# Patient Record
Sex: Female | Born: 1965
Health system: Southern US, Community
[De-identification: ages and names within clinical notes are randomized; demographics above are authoritative.]

## PROBLEM LIST (undated history)

## (undated) DIAGNOSIS — E039 Hypothyroidism, unspecified: Secondary | ICD-10-CM

## (undated) DIAGNOSIS — E785 Hyperlipidemia, unspecified: Secondary | ICD-10-CM

---

## 1997-09-13 ENCOUNTER — Other Ambulatory Visit: Admission: RE | Admit: 1997-09-13 | Discharge: 1997-09-13 | Payer: Self-pay | Admitting: *Deleted

## 1998-10-16 ENCOUNTER — Other Ambulatory Visit: Admission: RE | Admit: 1998-10-16 | Discharge: 1998-10-16 | Payer: Self-pay | Admitting: *Deleted

## 2000-07-13 ENCOUNTER — Other Ambulatory Visit: Admission: RE | Admit: 2000-07-13 | Discharge: 2000-07-13 | Payer: Self-pay | Admitting: *Deleted

## 2001-08-01 ENCOUNTER — Other Ambulatory Visit: Admission: RE | Admit: 2001-08-01 | Discharge: 2001-08-01 | Payer: Self-pay | Admitting: *Deleted

## 2002-01-22 ENCOUNTER — Other Ambulatory Visit: Admission: RE | Admit: 2002-01-22 | Discharge: 2002-01-22 | Payer: Self-pay | Admitting: *Deleted

## 2003-04-15 ENCOUNTER — Other Ambulatory Visit: Admission: RE | Admit: 2003-04-15 | Discharge: 2003-04-15 | Payer: Self-pay | Admitting: *Deleted

## 2004-07-01 ENCOUNTER — Encounter: Admission: RE | Admit: 2004-07-01 | Discharge: 2004-07-01 | Payer: Self-pay | Admitting: Obstetrics and Gynecology

## 2004-09-02 ENCOUNTER — Other Ambulatory Visit: Admission: RE | Admit: 2004-09-02 | Discharge: 2004-09-02 | Payer: Self-pay | Admitting: Obstetrics and Gynecology

## 2004-09-11 ENCOUNTER — Encounter: Admission: RE | Admit: 2004-09-11 | Discharge: 2004-09-11 | Payer: Self-pay | Admitting: Obstetrics and Gynecology

## 2009-08-09 ENCOUNTER — Ambulatory Visit: Payer: Self-pay | Admitting: Diagnostic Radiology

## 2009-08-09 ENCOUNTER — Emergency Department (HOSPITAL_BASED_OUTPATIENT_CLINIC_OR_DEPARTMENT_OTHER): Admission: EM | Admit: 2009-08-09 | Discharge: 2009-08-09 | Payer: Self-pay | Admitting: Emergency Medicine

## 2010-01-28 ENCOUNTER — Ambulatory Visit: Payer: Self-pay | Admitting: Sports Medicine

## 2010-01-28 DIAGNOSIS — M722 Plantar fascial fibromatosis: Secondary | ICD-10-CM | POA: Insufficient documentation

## 2010-02-26 ENCOUNTER — Ambulatory Visit: Payer: Self-pay | Admitting: Sports Medicine

## 2010-04-09 ENCOUNTER — Ambulatory Visit
Admission: RE | Admit: 2010-04-09 | Discharge: 2010-04-09 | Payer: Self-pay | Source: Home / Self Care | Attending: Sports Medicine | Admitting: Sports Medicine

## 2010-04-09 DIAGNOSIS — M79609 Pain in unspecified limb: Secondary | ICD-10-CM | POA: Insufficient documentation

## 2010-04-23 NOTE — Assessment & Plan Note (Signed)
Summary: LEFT HEEL PAIN X 1.5 MOS /NP/LP   Vital Signs:  Patient profile:   45 year old female Height:      66 inches Weight:      159 pounds BMI:     25.76 BP sitting:   120 / 64  Vitals Entered By: Lillia Pauls CMA (January 28, 2010 3:43 PM)   History of Present Illness: Normally runs 3x week 2nd year of runing program usually runs 3 miles  heel started hurting 45 days ago hard to walk at first  soaked and rolled on ice  still ran race - womens only  now pain first step hard to walk without landing on outside of left foot  Preventive Screening-Counseling & Management  Alcohol-Tobacco     Smoking Status: never  Allergies (verified): No Known Drug Allergies  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  Physical Exam  General:  Well-developed,well-nourished,in no acute distress; alert,appropriate and cooperative throughout examination Msk:  mod high arches TTP at insertion of PF into calcaneus medially on left good ROM great toe no TTP on left  weak left hip abductor  leg lengths equal Additional Exam:  MSK [place] LT PF is 0.57 RT PF is 0.39 spur on left noted no sign of tear   Impression & Recommendations:  Problem # 1:  PLANTAR FASCIITIS (ICD-728.71)  Orders: Foot Orthosis ( Arch Strap/Heel Cup) [phone])  given std PF stretches and exercises use arch strap use heel cups ice daily  reck 1 mo   Orders Added: 1)  Foot Orthosis ( Arch Strap/Heel Cup) [U0454] 2)  New Patient Level II [09811]

## 2010-04-23 NOTE — Assessment & Plan Note (Signed)
Summary: f/u,mc   Vital Signs:  Patient profile:   45 year old female BP sitting:   111 / 74  Vitals Entered By: Lillia Pauls CMA (February 26, 2010 9:42 AM)  History of Present Illness: Brenda Frost is here for one month follow up of L plantar fasciitis. She is greater than 50% improved, and feels she can walk without a limp. She only has pain after activity; no routine morning pain. She has been perfoming prescribed exercises and stretches and uses what sounds like heel lifts in her shoes. She has not been able to tolerate icing. She is now exercising twice a week 30-45 min on the eliptical or bike.  Allergies: No Known Drug Allergies  Physical Exam  General:  alert and well-nourished.   Msk:  Cavus feet. Normal ROM of foot, ankle, and great toe bilaterally. No swelling; minimal tenderness to deep palpation of L medial calcaneous. Pulses:  Posterior tibial pulses palpable and equal. Neurologic:  Barefoot gait cautious but with minimal limp.   Impression & Recommendations:  Problem # 1:  PLANTAR FASCIITIS (ICD-728.71) Assessment Improved Improved. Continue to stretch and perform exercises as instructed, and continue to ice as able.May begin slowly encorporating treadmill to workout if she is able to run without limp. Follow up in 6 weeks to rescan the tendon.   Orders Added: 1)  Est. Patient Level III [16109]

## 2010-04-23 NOTE — Assessment & Plan Note (Signed)
Summary: F/U LF LEG FASCIITIS/[facility]   Vital Signs:  Patient profile:   45 year old female Height:      66 inches Weight:      159 pounds Pulse rate:   72 / minute BP sitting:   105 / 72  (right arm)  Vitals Entered By: Rochele Pages RN (April 09, 2010 9:24 AM) CC: f/u L PF worse than last visit   CC:  f/u L PF worse than last visit.  History of Present Illness: Pt presents to clinic for follow up of L plantar fasciitis which she reports is worse than at last visit. Has had pain each morning, and pain is now in the dorsal and lateral aspect of L foot as well.  She continues to be compliant with PF exercises, wearing heel cup and arch strap.  Feels that her foot feels better at the end of the day when she removes the arch strap.  She uses the elliptical for exercise- feels some pressure on heel with this at the end of the workout.  Tried to run for 2 minutes, but this was painful.  Occasional numbness/tingling on anterior-lateral.  Some swelling along ankle at end of the day on both feet. Denies any numbness/tingling.  No previous steroid injections, but would like to avoid these if possible.  Has never had custom orthotics.  Preventive Screening-Counseling & Management  Alcohol-Tobacco     Smoking Status: never  Allergies: No Known Drug Allergies  Review of Systems       per HPI, othwerise ROS negative.  Physical Exam  General:  Well-developed,well-nourished,in no acute distress; alert,appropriate and cooperative throughout examination Msk:  ANKLES: FROM b/l without pain.  No swelling or effusion.  Mildly TTP along peroneal tendons on Lt, no swelling in this area, no tenderness over insertion of peroneal tendons.  Normal ankle strength b/l.  No instability.  FEET:  cavus foot b/l.  No significant callouse formation.  No hallux rigidis.  TTP along insertion of PF on Lt, no tenderness on Rt.  No transverse arch collapse.  GAIT: no leg length difference.  Walks with cautious gait,  trying to limit impact on left foot.  Lt foot slightly supinated.  Minimal limp noted. Pulses:  +2/4 DP & PT b/l Neurologic:  sensation intact to light touch.   Additional Exam:  MSK U/S: Plantar fascia- L PF measuring 0.47 today, slightly decreased from 0.57 on 01/28/10.  Hypoechoic area near insertion into calcaneus, but no true splitting or tear visible.  No increased doppler flow in this area.  Still with visible calcaneal spur.  Rt PF measuring 0.39.  Images saved.   Impression & Recommendations:  Problem # 1:  FOOT PAIN, LEFT (ICD-729.5) Assessment Deteriorated  - Worsening Lt foot pain secondary to plantar fasciitis - Discussed tx options, not interested in steroid injection, would like to try custom orthotics - was fitted for these in office today. Patient was fitted for a : standard, cushioned, semi-rigid orthotic. The orthotic was heated and afterward the patient stood on the orthotic blank positioned on the orthotic stand. The patient was positioned in subtalar neutral position and 10 degrees of ankle dorsiflexion in a weight bearing stance. After completion of molding, a stable base was applied to the orthotic blank. The blank was ground to a stable position for weight bearing. Size: 7 Base: blue swirl Posting: blue EVA Additional orthotic padding: none Orthotics comfortable to pt in office today and noted to have neutral gait with orthotics without  a limp. 45-mins spent with patient for evaluation & preparation of orthotics.  Orders: [place] LIMITED ([id number]) Orthotic Materials, each unit (U0454)  Problem # 2:  PLANTAR FASCIITIS (ICD-728.71) Assessment: Deteriorated  - worsening Lt PF - Fitted with custom orthotics in office today as noted above - May cont. to use heel cups with orthotics as needed - Cont. to wear arch strap - Cont. PF exercises/stretches - Cont to cross train with ellpitical, may attempt running in the future if not limping. - f/u 4-6 weeks for  re-evaluation, encouraged to call with any questions or concerns.  Orders: [place] LIMITED ([id number]) Orthotic Materials, each unit [phone])   Orders Added: 1)  Est. Patient Level IV [29562] 2)  [place] LIMITED [[id number]] 3)  Orthotic Materials, each unit [L3002]

## 2010-05-14 ENCOUNTER — Ambulatory Visit (INDEPENDENT_AMBULATORY_CARE_PROVIDER_SITE_OTHER): Payer: Commercial Managed Care - PPO | Admitting: Sports Medicine

## 2010-05-14 ENCOUNTER — Encounter: Payer: Self-pay | Admitting: Sports Medicine

## 2010-05-14 DIAGNOSIS — M79609 Pain in unspecified limb: Secondary | ICD-10-CM

## 2010-05-14 DIAGNOSIS — M722 Plantar fascial fibromatosis: Secondary | ICD-10-CM

## 2010-05-19 NOTE — Assessment & Plan Note (Signed)
Summary: 9:00 APPT - FU/MC/MJD   CC:  f/u L plantar fasciitis.  History of Present Illness: 45yo female to office for f/u L plantar fasaciitis.  No change in pain since last office visit when fitted with custom orthotics.  Still having pain each morning that is present throughout the day.  Pain is causing her to limp & walk on outside of her left foot - now starting to get increasing pain & some swelling along L 4th MT.  Continues to wear arch straps & do home exercises.  Has continued using the elliptical.  Denies numbness/tingling.  Is not taking anything for the pain.  No hx of steroid injections & would like to avoid these.  Allergies: No Known Drug Allergies  Review of Systems       per HPI  Physical Exam  General:  Well-developed,well-nourished,in no acute distress; alert,appropriate and cooperative throughout examination Msk:  ANKLES: FROM b/l without pain.  No swelling or effusion.  Mildly TTP along peroneal tendons on Lt, no swelling in this area, no tenderness over insertion of peroneal tendons.  Normal ankle strength b/l.  No instability.  FEET:  cavus foot b/l.  No significant callouse formation.  Extremely TTP along insertion of PF at medial calcaneous on Lt, no tenderness on Rt.  Small tender nodule with surrounding soft tissue swelling noted over proximal 4th MT.  Large amount of pain with MT squeeze on the left foot.    GAIT: no leg length difference.  Walks with prominent limp favoring left foot.  Walking with left foot supinated to limit impact on the heel.  Pulses:  +2/4 DP & PT Neurologic:  sensation intact to light touch.   Additional Exam:  MSK U/S: Plantar fascia- L PF measuring 0.51 today, no splitting noted, calcaneal spurring again noted.  Rt PF measuring 0.30cm.   Exam of MTs revealed normal appearing 5th MT & 5th TMT/MTP joints.  Proximal 4th MT with surrounding fluid & small, possible cortical irregularlity in long view, does appear to have cap sign over 4th MT  on transview - findings consistant with MT stress rxn vs stress fx.  Minimal increase in doppler flow noted in area of proximal 4th MT.  Normal appearing 3rd MT.  Images saved.   Impression & Recommendations:  Problem # 1:  FOOT PAIN, LEFT (ICD-729.5) - PF that is unchanged, now with proximal 4th MT stress reaction vs stress fracture - Fitted with post-op shoe in office today.  She was able to ambulate more comfortably with this.  May wear orthotic with this for addtional foot support - Cont. to wear arch straps - Recommend she start taking Calcium 1200mg  & Vit D 800 International Units daily - May use exercise bike if not having pain, may slowly progress back to elliptical if able - f/u 2-3 weeks for re-evaluation  Orders: Post-op Shoe (L3260) [place] LIMITED ([id number])  Problem # 2:  PLANTAR FASCIITIS (ICD-728.71) Assessment: Unchanged  - No significant improvement with custom orthotics, now complicated by left proximal 4th MT stress rxn vs stress fx - Post-op shoe as stated above with orthotic - may help with her PF - Cont. PF exercises/stretches - Cont. arch strap - Again discussed possibility of injection, but pt not interested - f/u 2-3 weeks  Orders: Post-op Shoe (L3260) [place] LIMITED ([id number])   Orders Added: 1)  Post-op Shoe [L3260] 2)  Est. Patient Level IV [69629] 3)  [place] LIMITED [[id number]]

## 2010-06-04 ENCOUNTER — Encounter: Payer: Self-pay | Admitting: Sports Medicine

## 2010-06-04 ENCOUNTER — Ambulatory Visit (INDEPENDENT_AMBULATORY_CARE_PROVIDER_SITE_OTHER): Payer: Commercial Managed Care - PPO | Admitting: Sports Medicine

## 2010-06-04 DIAGNOSIS — M722 Plantar fascial fibromatosis: Secondary | ICD-10-CM

## 2010-06-04 DIAGNOSIS — M84376A Stress fracture, unspecified foot, initial encounter for fracture: Secondary | ICD-10-CM | POA: Insufficient documentation

## 2010-06-08 LAB — URINE MICROSCOPIC-ADD ON

## 2010-06-08 LAB — URINE CULTURE: Colony Count: NO GROWTH

## 2010-06-08 LAB — URINALYSIS, ROUTINE W REFLEX MICROSCOPIC
Bilirubin Urine: NEGATIVE
Glucose, UA: NEGATIVE mg/dL
Hgb urine dipstick: NEGATIVE
Ketones, ur: NEGATIVE mg/dL
Nitrite: NEGATIVE
Protein, ur: NEGATIVE mg/dL
Specific Gravity, Urine: 1.011 (ref 1.005–1.030)
Urobilinogen, UA: 0.2 mg/dL (ref 0.0–1.0)
pH: 6 (ref 5.0–8.0)

## 2010-06-09 NOTE — Assessment & Plan Note (Signed)
Summary: F/U,MC   Vital Signs:  Patient profile:   45 year old female Pulse rate:   90 / minute BP sitting:   94 / 63  (right arm)  Vitals Entered By: Rochele Pages RN (June 04, 2010 9:51 AM) CC: f/u left foot   CC:  f/u left foot.  History of Present Illness: 45yo female to office for f/u of left foot 4th MT stress fx & plantar fasciitis.  Feels 20% improved overall.  Occasional burning sensation along dorsal-lateral foot.  Still wearing post-op shoe daily.  Arch straps are comfortable.  Able to use stationary bike & elliptical in regular shoe without much pain.  No longer having much swelling in the foot.  Using ibuprofen as needed.  Preventive Screening-Counseling & Management  Alcohol-Tobacco     Smoking Status: never  Allergies: No Known Drug Allergies  Physical Exam  General:  Well-developed,well-nourished,in no acute distress; alert,appropriate and cooperative throughout examination Msk:  ANNKLES: FROM b/l without pain.  No swelling.  Mildly TTP along peroneal tendons on Lt.  Normal strength.  FEET:  L foot- no significant soft tissue swelling, tender nodule noted last visit has resolved.  TTP along 4th MT, only minimal pain with MT squeeze.   Only mildly TTP along insertion of PF today.  GAIT: still walking with antalgic gait favoring left leg., although improved from last visit  NVI distally Additional Exam:  MSK U/S: L foot- 4th MT with good callus formation & (+)cap sign on transverse view, mild amount of surrounding hypoechoic fluid.  No signifiant neovessels noted.  Normal appearing 3rd & 5th MTs.  Images saved.   Impression & Recommendations:  Problem # 1:  STRESS FRACTURE OF THE METATARSALS (QIO-962.95) Assessment Improved  - Left 4th MT stress fx secondary to altered gait from extremely symptomatic plantar fasciitis - Good callus formation & healing noted on MSK u/s today - May start to wean post-op shoe - try wearing every other day for next 1-2 weeks. -  Wear supportive shoes with orthotics when out of post-op shoe - Cont arch straps - Cont elliptical & stationary bike, ok to start walking if not having much pain - f/u 3-weeks  Orders: [place] LIMITED ([id number])  Problem # 2:  PLANTAR FASCIITIS (ICD-728.71) Assessment: Improved  - Overall improved - Cont. arch straps & orthotics as stated above - Cont exercises - f/u 3-weeks  Orders: [place] LIMITED ([id number])  Patient Instructions: 1)  You have signs of healing on the ultrasound today. 2)  Start to wean from you cast shoe - wear every other day for the next 1-2 weeks.  Try to focus on walk upright witout a limp if possible. 3)  Cont. to wear arch straps. 4)  Cont. ibuprofen as needed. 5)  f/u 3-weeks. 6)  Call with any questions/concerns.   Orders Added: 1)  Est. Patient Level III [02725] 2)  [place] LIMITED [[id number]]

## 2010-07-07 ENCOUNTER — Ambulatory Visit: Payer: 59 | Admitting: Family Medicine

## 2010-07-07 ENCOUNTER — Encounter: Payer: Self-pay | Admitting: Family Medicine

## 2010-07-07 NOTE — Progress Notes (Signed)
Medical Nutrition Therapy:  Appt start time: 1430 end time:  1530.  Assessment:  Primary concerns today: Weight management and hyperlipidemia.  Aashika has had hyperlipidemia for years.  She was unsure of her recent LDL level, but lab results have been requested from Dr. Julio Sicks' office.  Deloma is frustrated that her weight loss efforts since Nov 2011 have resulted in no weight loss.  Usual eating pattern includes 3 meals and 2-3 snacks per day. Usual bkfst is 2-3 egg whites & 6 oz [place] yogurt, [employer] coffee (at lst 3 X wk) w/ 4 creamers & 4 Splenda.  Avoided foods include fried foods, pizza (usually), and most breads.  24-hr recall: B (8 AM)- 6 oz Greek yogurt; Snk (10 AM)- 8 amonds; L (12 PM)- salad w/ sunflwr seeds & ~3 tbsp dressing, 1 c chx soup, water; Snk- none; D (7:30 PM)- 1 slc cheese pizza, sliver b'day cake, water; Snk- none.  Atypical yesterday in that bkfst was smaller than usual, and dinner is usually lean pro  and veg's.  Usual physical activity includes 45-60 min (or 500 kcal) eliptical or spin class 3-4 X wk.  Yaslin was doing wt training, but gained wt with that.  Since last November, Alisa has been drinking more water, eating bkfst, more protein, and eating dinner earlier.  She uses [employer] for tracking kcal.    Progress Towards Goal(s):  In progress.   Nutritional Diagnosis:  NI-1.4 Inadequate energy intake As related to metabolic needs.  As evidenced by no weight loss despite low energy intake and consistent exercise 3-4 X wk.  I wonder if Rebeka has altered her RMR through dietary restriction.  Inadequate vegetables and fruits.      Intervention:  Nutrition education.  Monitoring/Evaluation:  Dietary intake, exercise, and body weight in 3 weeks.

## 2010-07-07 NOTE — Patient Instructions (Addendum)
-   Never go more than 5 hours without eating.  - Let hunger be your guide:  If you are hungry, you should eat!   - Ask if there is a non- or low-fat creamer at [employer].   - Explore [employer] app (look for [employer] creamer).  Reduce the amount of creamer and Splenda you use progressively over the next several weeks.   - Increase vegetables, aiming for at least 2 X day.  (Plan ahead; streamline process...) - Obtain twice as many veg's as protein for lunch and dinner (and include starch as appropriate).   - When choosing starch foods, go for high fiber.   - Email at least 5 days worth of food records, including time of eating, what, and how much you eat.  (Provide as many details as possible, calories, carb's, fat, and protein).   - Physical activity:  Aim for at least 5 X wk; include walking to complement your other ex.   - Add fruit to breakfast.  Continue black beans on Saturday breakfasts.

## 2010-07-27 ENCOUNTER — Ambulatory Visit (INDEPENDENT_AMBULATORY_CARE_PROVIDER_SITE_OTHER): Payer: 59 | Admitting: Family Medicine

## 2010-07-27 ENCOUNTER — Ambulatory Visit: Payer: 59 | Admitting: Family Medicine

## 2010-07-27 VITALS — Ht 66.0 in | Wt 160.5 lb

## 2010-07-27 DIAGNOSIS — E785 Hyperlipidemia, unspecified: Secondary | ICD-10-CM | POA: Insufficient documentation

## 2010-07-27 NOTE — Patient Instructions (Addendum)
Dealing with emotional eating: - Delay:  Neither urges nor emotions last forever - Distract:  Engaging in another activity can truly get your mind off of food  - Distance:  Move to another room (away from the food), go for a walk, or change your environment - Determine:  What is happening here, and what are my options? - Decide:  What will be your action plan? - Use the eating decisions algorithm (see handout) when needed.  Also think about other responses to stress than food, i.e., exercise, sleep.   - Advance planning is going to be crucial to your success in making good food and exercise choices.   - Discipine to USE THESE TOOLS.  - Continue to decrease cream and sugar in coffee as tolerated.   - Physical activity goal this week:  At least 3 X.

## 2010-07-27 NOTE — Progress Notes (Signed)
Medical Nutrition Therapy:  Appt start time: 1430 end time:  1530.  Assessment:  Primary concerns today: Weight management and hyperlipidemia.   Kirsta has been very stressed at work and has worked close to 60 hours/wk in the past couple of weeks.  This has affected ability to plan or prepare meals and to buy foods, as well as to exercise as much as she'd like.  She did manage to do 40 min on the eliptical last week 2 X last week, and she did one Zumba class.  Saturday's food intake:   8:30 AM - 1 cup Kashi Oat cereal with skim milk- very hunger 10:30 AM - 1 [employer] coffee Med, 3c, 3s Lunch (1PM) 1 [place] sandwich with 1 slice of provolone cheese, [place] on thin whole wheat bread 10 Cheddar Combos 1 orange 1 water Dinner (7 PM) 1 slice of [place] 6 oz low-cal Margarita with lots of lime  Snk (9 PM)  1 c basmati rice  c black beans  Progress Towards Goal(s):  In progress.   Nutritional Diagnosis:  NI-1.4 Inadequate energy intake As related to metabolic needs.  As evidenced by no weight loss despite low energy intake and consistent exercise 3-4 X wk.  I wonder if Chalee has altered her RMR through dietary restriction.  Inadequate vegetables and fruits.      Intervention:  Nutrition education.  Monitoring/Evaluation:  Dietary intake, exercise, and body weight in 4 weeks.

## 2010-08-03 ENCOUNTER — Encounter: Payer: Self-pay | Admitting: Family Medicine

## 2010-08-24 ENCOUNTER — Ambulatory Visit (INDEPENDENT_AMBULATORY_CARE_PROVIDER_SITE_OTHER): Payer: 59 | Admitting: Family Medicine

## 2010-08-24 DIAGNOSIS — E785 Hyperlipidemia, unspecified: Secondary | ICD-10-CM

## 2010-08-24 NOTE — Progress Notes (Signed)
Medical Nutrition Therapy:  Appt start time: 1230 end time:  1300.  Assessment:  Primary concerns today: Weight management and hyperlipidemia.   Jalee has been successful in getting water intake through the day (total of at least 8 16-oz bottles).  She is drinking coffee only once a week.  She's been having a protein shake for breakfast, and she's considering using this for an afternoon snack.  Norissa has been exercising at least 3 X wk.  (Goal this week is every day.)  24-hr recall suggests intake of ~1200 kcal: B (9 AM)- water, 2 scrambled eggs, 1c strawberries; Snk- water; L (1 PM)- 2 tbsp tuna w/ tomato, cilantro, & lime, 1/2 c rice, 1/2 potato w/ veg mixture, water; Snk (4 PM)- 1/2 banana w/ 1 tbsp pb, 3 tbsp quinoa & blackeyed peas, water; D (7 PM)- 2 tbsp tuna, 2 bread thins, 10-15 cheese nips, water; Snk (7:30 PM)- 1 apple, water.   Progress Towards Goal(s):  In progress.   Nutritional Diagnosis:  NI-1.4 Inadequate energy intake As related to metabolic needs.  As evidenced by no weight loss despite low energy intake and consistent exercise 3-4 X wk.  Inadequate vegetables and fruits.      Intervention:  Nutrition education.  Monitoring/Evaluation:  Dietary intake, exercise, and body weight in 4 weeks.

## 2010-08-24 NOTE — Patient Instructions (Signed)
-   Increase vegetables; think ahead about what veg's you'll include at both lunch and dinner. - Project:  Grab & go veg's and fruits.   - Make a list of 7 meals that you like, that meet your nutritional needs, and are easy to prepare.  Email that to [facility] for review.   - Call or email in a week for a July appt:  [phone].  Let me know 2 or 3 preferred days and times.

## 2013-08-07 ENCOUNTER — Encounter (HOSPITAL_COMMUNITY): Payer: Self-pay | Admitting: Emergency Medicine

## 2013-08-07 ENCOUNTER — Emergency Department (HOSPITAL_COMMUNITY)
Admission: EM | Admit: 2013-08-07 | Discharge: 2013-08-07 | Disposition: A | Discharge status: Home / Self Care | Payer: 59 | Attending: Emergency Medicine | Admitting: Emergency Medicine

## 2013-08-07 DIAGNOSIS — Z9889 Other specified postprocedural states: Secondary | ICD-10-CM | POA: Insufficient documentation

## 2013-08-07 DIAGNOSIS — M549 Dorsalgia, unspecified: Secondary | ICD-10-CM | POA: Insufficient documentation

## 2013-08-07 DIAGNOSIS — Z79899 Other long term (current) drug therapy: Secondary | ICD-10-CM | POA: Insufficient documentation

## 2013-08-07 DIAGNOSIS — R1013 Epigastric pain: Secondary | ICD-10-CM | POA: Insufficient documentation

## 2013-08-07 DIAGNOSIS — E039 Hypothyroidism, unspecified: Secondary | ICD-10-CM | POA: Insufficient documentation

## 2013-08-07 LAB — CBC
HCT: 42.6 % (ref 36.0–46.0)
Hemoglobin: 14.3 g/dL (ref 12.0–15.0)
MCH: 30.3 pg (ref 26.0–34.0)
MCHC: 33.6 g/dL (ref 30.0–36.0)
MCV: 90.3 fL (ref 78.0–100.0)
Platelets: 213 10*3/uL (ref 150–400)
RBC: 4.72 MIL/uL (ref 3.87–5.11)
RDW: 13.4 % (ref 11.5–15.5)
WBC: 7.3 10*3/uL (ref 4.0–10.5)

## 2013-08-07 LAB — I-STAT TROPONIN, ED: Troponin i, poc: 0 ng/mL (ref 0.00–0.08)

## 2013-08-07 LAB — BASIC METABOLIC PANEL
BUN: 17 mg/dL (ref 6–23)
CO2: 26 mEq/L (ref 19–32)
Calcium: 9.5 mg/dL (ref 8.4–10.5)
Chloride: 102 mEq/L (ref 96–112)
Creatinine, Ser: 0.79 mg/dL (ref 0.50–1.10)
GFR calc Af Amer: >90 mL/min (ref >90)
GFR calc non Af Amer: >90 mL/min (ref >90)
Glucose, Bld: 89 mg/dL (ref 70–99)
Potassium: 4.4 mEq/L (ref 3.7–5.3)
Sodium: 140 mEq/L (ref 137–147)

## 2013-08-07 MED ORDER — GI COCKTAIL ~~LOC~~
30.0000 mL | Freq: Once | ORAL | Status: AC
  Administered 2013-08-07: 30 mL via ORAL
  Filled 2013-08-07: qty 30

## 2013-08-07 NOTE — ED Notes (Signed)
She sitting at work and felt a sudden sharp epigastric pain radiating into her back. She states the pain is gone but "It feels like there is a lump stuck in my throat now."

## 2013-08-07 NOTE — ED Notes (Addendum)
PT works at [employer] and this morning she began feeling sharp epigastric pain radiating to her back. PT tried to drink water and pain didn't subside. Pain is intermittent 9/10 pain and she states it feels like a lump in her throat as well. PT is thinking back and was feeling "off" last night and didn't eat dinner.

## 2013-08-09 ENCOUNTER — Observation Stay (HOSPITAL_COMMUNITY)
Admission: EM | Admit: 2013-08-09 | Discharge: 2013-08-10 | Disposition: A | Discharge status: Home / Self Care | Payer: 59 | Attending: Internal Medicine | Admitting: Internal Medicine

## 2013-08-09 ENCOUNTER — Observation Stay (HOSPITAL_COMMUNITY): Payer: 59

## 2013-08-09 ENCOUNTER — Encounter (HOSPITAL_COMMUNITY): Payer: Self-pay | Admitting: Emergency Medicine

## 2013-08-09 DIAGNOSIS — R1013 Epigastric pain: Principal | ICD-10-CM | POA: Insufficient documentation

## 2013-08-09 DIAGNOSIS — M722 Plantar fascial fibromatosis: Secondary | ICD-10-CM | POA: Insufficient documentation

## 2013-08-09 DIAGNOSIS — R932 Abnormal findings on diagnostic imaging of liver and biliary tract: Secondary | ICD-10-CM | POA: Insufficient documentation

## 2013-08-09 DIAGNOSIS — E039 Hypothyroidism, unspecified: Secondary | ICD-10-CM | POA: Diagnosis present

## 2013-08-09 DIAGNOSIS — R079 Chest pain, unspecified: Secondary | ICD-10-CM | POA: Diagnosis present

## 2013-08-09 DIAGNOSIS — M79609 Pain in unspecified limb: Secondary | ICD-10-CM

## 2013-08-09 DIAGNOSIS — Z79899 Other long term (current) drug therapy: Secondary | ICD-10-CM | POA: Insufficient documentation

## 2013-08-09 DIAGNOSIS — E785 Hyperlipidemia, unspecified: Secondary | ICD-10-CM | POA: Insufficient documentation

## 2013-08-09 DIAGNOSIS — R1011 Right upper quadrant pain: Secondary | ICD-10-CM | POA: Insufficient documentation

## 2013-08-09 DIAGNOSIS — M84376A Stress fracture, unspecified foot, initial encounter for fracture: Secondary | ICD-10-CM | POA: Insufficient documentation

## 2013-08-09 DIAGNOSIS — R1012 Left upper quadrant pain: Secondary | ICD-10-CM | POA: Insufficient documentation

## 2013-08-09 HISTORY — DX: Hyperlipidemia, unspecified: E78.5

## 2013-08-09 HISTORY — DX: Hypothyroidism, unspecified: E03.9

## 2013-08-09 LAB — CBC WITH DIFFERENTIAL/PLATELET
BASOS PCT: 0 % (ref 0–1)
Basophils Absolute: 0 10*3/uL (ref 0.0–0.1)
EOS PCT: 0 % (ref 0–5)
Eosinophils Absolute: 0 10*3/uL (ref 0.0–0.7)
HEMATOCRIT: 43.2 % (ref 36.0–46.0)
Hemoglobin: 14.2 g/dL (ref 12.0–15.0)
Lymphocytes Relative: 21 % (ref 12–46)
Lymphs Abs: 1.9 10*3/uL (ref 0.7–4.0)
MCH: 29.8 pg (ref 26.0–34.0)
MCHC: 32.9 g/dL (ref 30.0–36.0)
MCV: 90.8 fL (ref 78.0–100.0)
Monocytes Absolute: 0.6 10*3/uL (ref 0.1–1.0)
Monocytes Relative: 7 % (ref 3–12)
NEUTROS ABS: 6.5 10*3/uL (ref 1.7–7.7)
NEUTROS PCT: 72 % (ref 43–77)
PLATELETS: 186 10*3/uL (ref 150–400)
RBC: 4.76 MIL/uL (ref 3.87–5.11)
RDW: 13.2 % (ref 11.5–15.5)
WBC: 9 10*3/uL (ref 4.0–10.5)

## 2013-08-09 LAB — URINALYSIS, ROUTINE W REFLEX MICROSCOPIC
Bilirubin Urine: NEGATIVE
Glucose, UA: NEGATIVE mg/dL
Hgb urine dipstick: NEGATIVE
Ketones, ur: NEGATIVE mg/dL
Leukocytes, UA: NEGATIVE
NITRITE: NEGATIVE
PH: 7.5 (ref 5.0–8.0)
Protein, ur: NEGATIVE mg/dL
SPECIFIC GRAVITY, URINE: 1.009 (ref 1.005–1.030)
Urobilinogen, UA: 0.2 mg/dL (ref 0.0–1.0)

## 2013-08-09 LAB — TROPONIN I: Troponin I: <0.3 ng/mL (ref <0.30)

## 2013-08-09 LAB — COMPREHENSIVE METABOLIC PANEL
ALBUMIN: 3.7 g/dL (ref 3.5–5.2)
ALT: 12 U/L (ref 0–35)
AST: 17 U/L (ref 0–37)
Alkaline Phosphatase: 102 U/L (ref 39–117)
BUN: 14 mg/dL (ref 6–23)
CO2: 25 mEq/L (ref 19–32)
CREATININE: 0.76 mg/dL (ref 0.50–1.10)
Calcium: 9.8 mg/dL (ref 8.4–10.5)
Chloride: 104 mEq/L (ref 96–112)
GFR calc Af Amer: >90 mL/min (ref >90)
GLUCOSE: 89 mg/dL (ref 70–99)
POTASSIUM: 4 meq/L (ref 3.7–5.3)
Sodium: 143 mEq/L (ref 137–147)
TOTAL PROTEIN: 7.3 g/dL (ref 6.0–8.3)
Total Bilirubin: 0.6 mg/dL (ref 0.3–1.2)

## 2013-08-09 LAB — LIPASE, BLOOD: LIPASE: 57 U/L (ref 11–59)

## 2013-08-09 LAB — TSH
TSH: 2.84 u[IU]/mL (ref 0.350–4.500)
TSH: 4.3 u[IU]/mL (ref 0.350–4.500)

## 2013-08-09 MED ORDER — GI COCKTAIL ~~LOC~~: 30.0000 mL | Freq: Two times a day (BID) | ORAL | Status: DC | PRN

## 2013-08-09 MED ORDER — ENOXAPARIN SODIUM 40 MG/0.4ML ~~LOC~~ SOLN
40.0000 mg | SUBCUTANEOUS | Status: DC
  Filled 2013-08-09 (×2): qty 0.4

## 2013-08-09 MED ORDER — SODIUM CHLORIDE 0.9 % IJ SOLN: 3.0000 mL | INTRAMUSCULAR | Status: DC | PRN

## 2013-08-09 MED ORDER — SODIUM CHLORIDE 0.9 % IV SOLN: 250.0000 mL | INTRAVENOUS | Status: DC | PRN

## 2013-08-09 MED ORDER — OXYCODONE HCL 5 MG PO TABS: 5.0000 mg | ORAL_TABLET | ORAL | Status: DC | PRN

## 2013-08-09 MED ORDER — POLYETHYLENE GLYCOL 3350 17 G PO PACK
17.0000 g | PACK | Freq: Every day | ORAL | Status: DC
  Filled 2013-08-09 (×2): qty 1

## 2013-08-09 MED ORDER — GUAIFENESIN-DM 100-10 MG/5ML PO SYRP
5.0000 mL | ORAL_SOLUTION | ORAL | Status: DC | PRN
Start: 2013-08-09 — End: 2013-08-10
  Filled 2013-08-09: qty 5

## 2013-08-09 MED ORDER — ACETAMINOPHEN 650 MG RE SUPP: 650.0000 mg | Freq: Four times a day (QID) | RECTAL | Status: DC | PRN

## 2013-08-09 MED ORDER — ASPIRIN 81 MG PO CHEW
81.0000 mg | CHEWABLE_TABLET | Freq: Every day | ORAL | Status: DC
  Administered 2013-08-09 – 2013-08-10 (×2): 81 mg via ORAL
  Filled 2013-08-09 (×2): qty 1

## 2013-08-09 MED ORDER — PANTOPRAZOLE SODIUM 40 MG IV SOLR
40.0000 mg | INTRAVENOUS | Status: DC
  Filled 2013-08-09 (×2): qty 40

## 2013-08-09 MED ORDER — NITROGLYCERIN 0.4 MG SL SUBL: 0.4000 mg | SUBLINGUAL_TABLET | SUBLINGUAL | Status: DC | PRN

## 2013-08-09 MED ORDER — ONDANSETRON HCL 4 MG/2ML IJ SOLN: 4.0000 mg | Freq: Four times a day (QID) | INTRAMUSCULAR | Status: DC | PRN

## 2013-08-09 MED ORDER — ALUM & MAG HYDROXIDE-SIMETH 200-200-20 MG/5ML PO SUSP: 30.0000 mL | Freq: Four times a day (QID) | ORAL | Status: DC | PRN

## 2013-08-09 MED ORDER — LEVOTHYROXINE SODIUM 75 MCG PO TABS
75.0000 ug | ORAL_TABLET | Freq: Every day | ORAL | Status: DC
  Administered 2013-08-10: 75 ug via ORAL
  Filled 2013-08-09 (×2): qty 1

## 2013-08-09 MED ORDER — ALUM & MAG HYDROXIDE-SIMETH 200-200-20 MG/5ML PO SUSP
30.0000 mL | ORAL | Status: DC | PRN
  Administered 2013-08-09: 30 mL via ORAL
  Filled 2013-08-09: qty 30

## 2013-08-09 MED ORDER — ACETAMINOPHEN 325 MG PO TABS: 650.0000 mg | ORAL_TABLET | Freq: Four times a day (QID) | ORAL | Status: DC | PRN

## 2013-08-09 MED ORDER — ONDANSETRON HCL 4 MG PO TABS: 4.0000 mg | ORAL_TABLET | Freq: Four times a day (QID) | ORAL | Status: DC | PRN

## 2013-08-09 MED ORDER — NITROGLYCERIN 0.4 MG SL SUBL
0.4000 mg | SUBLINGUAL_TABLET | Freq: Once | SUBLINGUAL | Status: AC
  Administered 2013-08-09: 0.4 mg via SUBLINGUAL

## 2013-08-09 MED ORDER — PANTOPRAZOLE SODIUM 40 MG IV SOLR: 40.0000 mg | INTRAVENOUS | Status: DC

## 2013-08-09 MED ORDER — MORPHINE SULFATE 2 MG/ML IJ SOLN
0.5000 mg | INTRAMUSCULAR | Status: DC | PRN
  Administered 2013-08-09: 0.5 mg via INTRAVENOUS

## 2013-08-09 MED ORDER — MORPHINE SULFATE 2 MG/ML IJ SOLN
1.0000 mg | INTRAMUSCULAR | Status: DC | PRN
  Filled 2013-08-09: qty 1

## 2013-08-09 MED ORDER — PANTOPRAZOLE SODIUM 40 MG IV SOLR
40.0000 mg | Freq: Once | INTRAVENOUS | Status: AC
  Administered 2013-08-09: 40 mg via INTRAVENOUS
  Filled 2013-08-09: qty 40

## 2013-08-09 MED ORDER — DIPHENHYDRAMINE HCL 25 MG PO CAPS: 25.0000 mg | ORAL_CAPSULE | Freq: Every evening | ORAL | Status: DC | PRN

## 2013-08-09 MED ORDER — SODIUM CHLORIDE 0.9 % IV SOLN
INTRAVENOUS | Status: DC
  Administered 2013-08-09: 11:00:00 via INTRAVENOUS

## 2013-08-09 MED ORDER — SODIUM CHLORIDE 0.9 % IJ SOLN: 3.0000 mL | Freq: Two times a day (BID) | INTRAMUSCULAR | Status: DC

## 2013-08-09 MED ORDER — SODIUM CHLORIDE 0.9 % IJ SOLN
3.0000 mL | Freq: Two times a day (BID) | INTRAMUSCULAR | Status: DC
  Administered 2013-08-09: 3 mL via INTRAVENOUS

## 2013-08-09 MED ORDER — ALBUTEROL SULFATE (2.5 MG/3ML) 0.083% IN NEBU
2.5000 mg | INHALATION_SOLUTION | RESPIRATORY_TRACT | Status: DC | PRN
Start: 2013-08-09 — End: 2013-08-10

## 2013-08-09 NOTE — ED Notes (Signed)
Dr. Ghimire at bedside 

## 2013-08-09 NOTE — ED Notes (Signed)
Report called to [place], RN

## 2013-08-09 NOTE — ED Notes (Signed)
Pt reports epigastric pain since Tuesday, was seen here told esophageal spasms, pain continues, doesn't know if she is having CP. Pain also under left breast. Pt is tearful, is a x 4. Denies n/v/d. Denies cardiac hx

## 2013-08-09 NOTE — ED Provider Notes (Addendum)
CSN: [id number]     Arrival date & time 08/09/13  1024 History   First MD Initiated Contact with Patient 08/09/13 1037     Chief Complaint  Patient presents with  . Abdominal Pain     (Consider location/radiation/quality/duration/timing/severity/associated sxs/prior Treatment) HPI Comments: Patient presents to the ER for evaluation of abdominal pain. Patient was seen in the ER 2 days ago with similar complaints. Patient is complaining of a crampy, squeezing, waxing and waning pain in the left upper abdominal area. She has not identified any alleviating or exacerbating factors. Patient has not had any relief since coming to the emergency department and in fact symptoms are worsening. She is not expressing any chest pain or shortness of breath. There has not been any fever.  Patient is a 48 y.o. female presenting with abdominal pain.  Abdominal Pain   History reviewed. No pertinent past medical history. Past Surgical History  Procedure Laterality Date  . Cesarean section     No family history on file. History  Substance Use Topics  . Smoking status: Never Smoker   . Smokeless tobacco: Not on file  . Alcohol Use: 1.2 oz/week    2 Cans of beer per week   OB History   Grav Para Term Preterm Abortions TAB SAB Ect Mult Living                 Review of Systems  Gastrointestinal: Positive for abdominal pain.  All other systems reviewed and are negative.     Allergies  Review of patient's allergies indicates no known allergies.  Home Medications   Prior to Admission medications   Medication Sig Start Date End Date Taking? Authorizing Provider  calcium carbonate (TUMS - DOSED IN MG ELEMENTAL CALCIUM) 500 MG chewable tablet Chew 2 tablets by mouth 2 (two) times daily as needed for indigestion or heartburn.   Yes Historical Provider, MD  levothyroxine (SYNTHROID, LEVOTHROID) 75 MCG tablet Take 75 mcg by mouth daily.     Yes Historical Provider, MD   BP 132/72  Pulse 71   Temp(Src) 98.3 F (36.8 C) (Oral)  Resp 16  Ht 5\' 6"  (1.676 m)  Wt 165 lb (74.844 kg)  BMI 26.64 kg/m2  SpO2 100%  LMP 08/02/2013 Physical Exam  Constitutional: She is oriented to person, place, and time. She appears well-developed and well-nourished. She appears distressed (tearful).  HENT:  Head: Normocephalic and atraumatic.  Right Ear: Hearing normal.  Left Ear: Hearing normal.  Nose: Nose normal.  Mouth/Throat: Oropharynx is clear and moist and mucous membranes are normal.  Eyes: Conjunctivae and EOM are normal. Pupils are equal, round, and reactive to light.  Neck: Normal range of motion. Neck supple.  Cardiovascular: Regular rhythm, S1 normal and S2 normal.  Exam reveals no gallop and no friction rub.   No murmur heard. Pulmonary/Chest: Effort normal and breath sounds normal. No respiratory distress. She exhibits no tenderness.  Abdominal: Soft. Normal appearance and bowel sounds are normal. There is no hepatosplenomegaly. There is tenderness in the right upper quadrant, epigastric area and left upper quadrant. There is no rebound, no guarding, no tenderness at McBurney's point and negative Murphy's sign. No hernia.  Musculoskeletal: Normal range of motion.  Neurological: She is alert and oriented to person, place, and time. She has normal strength. No cranial nerve deficit or sensory deficit. Coordination normal. GCS eye subscore is 4. GCS verbal subscore is 5. GCS motor subscore is 6.  Skin: Skin is warm, dry and intact.  No rash noted. No cyanosis.  Psychiatric: She has a normal mood and affect. Her speech is normal and behavior is normal. Thought content normal.    ED Course  Procedures (including critical care time) Labs Review Labs Reviewed  CBC WITH DIFFERENTIAL  COMPREHENSIVE METABOLIC PANEL  TROPONIN I  LIPASE, BLOOD  URINALYSIS, ROUTINE W REFLEX MICROSCOPIC    Imaging Review No results found.   EKG Interpretation   Date/Time:  Thursday Aug 09 2013 10:35:08  EDT Ventricular Rate:  77 PR Interval:  144 QRS Duration: 78 QT Interval:  362 QTC Calculation: 409 R Axis:   91 Text Interpretation:  Normal sinus rhythm Rightward axis Septal infarct ,  age undetermined Abnormal ECG No significant change since last tracing  Confirmed by Markeem Noreen  MD, [place] [phone]) on 08/09/2013 11:09:00 AM      MDM   Final diagnoses:  None    Patient presents to the ER for evaluation of abdominal pain. Pain is epigastric in nature. She is not experiencing any pain below the umbilicus. Examination reveals tenderness in the right upper quadrant, epigastric, left upper quadrant. Pain and tenderness over worse in the left than the right. No Murphy sign on exam. Low suspicion for cardiac etiology. EKG was unchanged from previous. Will cycle cardiac enzymes. Lab work including lipase and liver function tests ordered. Although she is not significantly tender in the right upper quadrant, will rule out gallbladder disease ultrasound. Patient is to be admitted to the hospitalist service for further management. She was referred to the emergency department by Doctor Sloan Leiter, one of the hospitalists. He has been here in the ER to evaluate her at her arrival. He has written admission orders prior to workup being completed, he will followup on results.    Orpah Greek, MD 08/09/13 Lucas, MD 08/09/13 1116

## 2013-08-09 NOTE — H&P (Signed)
PATIENT DETAILS Name: Brenda Frost Age: 48 y.o. Sex: female Date of Birth: 1966-02-07 Admit Date: 08/09/2013 PCP:No primary provider on file.   CHIEF COMPLAINT:  Squeezing chest pain  HPI: Brenda Frost is a 48 y.o. female with virtually no past medical history, other than hypothyroidism, presented to the ER with chest pain.  The patient first had the pain on Tuesday 5/19 after drinking coffee.  The occurs in the epigastrum/substernal chest area.  It is constant and radiates straight to the back and down the left arm.  She was treated in the ED on Tuesday with nitro and GI cocktail.  Her pain improved but then returned once she went home Tuesday night.  This morning the pain recurred after drinking coffee once again.  Ms. Weniger described epigastric burning and squeezing and a twisting sensation in her chest. She has never had this pain before Tuesday.  Over the last few days Ms. Outland has also been increasingly fatigued and feels as though she is "coming down with something" - but she has not noticed specific symptoms such as fever / cough.  She denies nsaid use.  She rarely drinks alcohol.  She has not had vomiting, hematochezia or melena.   Her labs in the ER appear normal.    ALLERGIES:  No Known Allergies  PAST MEDICAL HISTORY: None.  PAST SURGICAL HISTORY: Past Surgical History  Procedure Laterality Date  . Cesarean section      MEDICATIONS AT HOME: Prior to Admission medications   Medication Sig Start Date End Date Taking? Authorizing Provider  calcium carbonate (TUMS - DOSED IN MG ELEMENTAL CALCIUM) 500 MG chewable tablet Chew 2 tablets by mouth 2 (two) times daily as needed for indigestion or heartburn.   Yes Historical Provider, MD  levothyroxine (SYNTHROID, LEVOTHROID) 75 MCG tablet Take 75 mcg by mouth daily.     Yes Historical Provider, MD    FAMILY HISTORY: Mother with new diagnosis of DM.  No known cardiac history in the family.  No history of peptic  ulcers or gall bladder disease.  SOCIAL HISTORY:  reports that she has never smoked. She does not have any smokeless tobacco history on file. She reports that she drinks about 1.2 ounces of alcohol per week. She reports that she does not use illicit drugs.  She lives at home with her husband. 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  REVIEW OF SYSTEMS:  Constitutional:   +Fatigue, +constipation No fever, cough, sob.  No dysuria. No dizziness or shortness of breath. All other systems were reviewed and found to be normal.  . PHYSICAL EXAM: Blood pressure 132/72, pulse 71, temperature 98.3 F (36.8 C), temperature source Oral, resp. rate 16, height 5\' 6"  (1.676 m), weight 74.844 kg (165 lb), last menstrual period 08/02/2013, SpO2 100.00%.  General appearance :Awake, alert, not in any distress. Speech Clear. Not toxic Looking HEENT: Atraumatic and Normocephalic, pupils equally reactive to light and accomodation, MMM Neck: supple, no JVD. No cervical lymphadenopathy.  Chest:Good air entry bilaterally, no added sounds, no chest pain with inspiration.  CVS: S1 S2 regular, no murmurs. Non tender to palpation Abdomen: Bowel sounds present, not distended with no gaurding, rigidity or rebound.   Slightly tender in the epigastrum and LLQ to palpation. Extremities: B/L Lower Ext shows no edema, both legs are warm to touch Neurology: Awake alert, and oriented X 3, CN II-XII intact, Non focal Skin:No Rash  LABS ON ADMISSION:   Recent Labs  08/07/13 1237  NA 140  K 4.4  CL 102  CO2 26  GLUCOSE 89  BUN 17  CREATININE 0.79  CALCIUM 9.5    Recent Labs  08/07/13 1237  WBC 7.3  HGB 14.3  HCT 42.6  MCV 90.3  PLT 213     RADIOLOGIC STUDIES ON ADMISSION: No results found.   EKG: Independently reviewed.  NSR,  No st elevation or depression.  ASSESSMENT AND PLAN: Present on Admission:  . Chest pain . Hypothyroidism   Chest pain vs esophageal spasm Will  rule out ACS, cycle troponin.  Check TSH Consult Cardiology. Is not reproducible on palpation or inspiration Patient may require stress test will leave NPO. IV protonix.  GI cocktail   Hypothyroidism Continue synthroid Check TSH.  Further plan will depend as patient's clinical course evolves and further radiologic and laboratory data become available. Patient will be monitored closely.   Above noted plan was discussed with husband, they were in agreement.   DVT Prophylaxis: Prophylactic Lovenox    Code Status: Full Code  Total time spent for admission equals 45 minutes.  Imogene Burn, [place] [facility] Pager [phone]  If 7PM-7AM, please contact night-coverage [email or web address] Password [facility] 08/09/2013, 11:09 AM   Attending Patient seen and examined, second episode of lower chest/epigastric pain radiating to left chest-I suspect this is Diffuse Esophageal Spasms-but will first rule out ACS-and Nuc Stress test-if this is negative-then will need GI work up. Will start on low dose CCB once stress testing is complete  S Ghimire  **Disclaimer: This note may have been dictated with voice recognition software. Similar sounding words can inadvertently be transcribed and this note may contain transcription errors which may not have been corrected upon publication of note.**

## 2013-08-09 NOTE — Consult Note (Signed)
Patient ID: Brenda Frost MRN: [id number] DOB/AGE: Mar 14, 1966 48 y.o.  Admit date: 08/09/2013 Referring Physician: Sloan Leiter Primary Physician: Tressa Busman ([place]) Primary Cardiologist: New Reason for Consultation:  Chest pain  HPI: 48 yo female with history of HLD, hypothyroidism presented to ED with c/o epigastric pain since this am. She describes a squeezing in her epigastric area radiating to her left breast. This is not associated with SOB, diaphoresis, N/V. She had a similar episode two days ago and had normal EKG in the ED. EKG today without ischemic changes. Cardiac markers negative x 1. She is very active and does Crossfit several times per week. She exercised this am without any chest discomfort. No prior cardiac issues. 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   Past Medical History  Diagnosis Date  . Hypothyroidism   . Hyperlipidemia     Family History  Problem Relation Age of Onset  . CAD Neg Hx     History   Social History  . Marital Status: Single    Spouse Name: N/A    Number of Children: N/A  . Years of Education: N/A   Occupational History  . Nurse [employer]   Social History Main Topics  . Smoking status: Never Smoker   . Smokeless tobacco: Not on file  . Alcohol Use: 1.2 oz/week    2 Cans of beer per week  . Drug Use: No  . Sexual Activity: Not on file   Other Topics Concern  . Not on file   Social History Narrative  . No narrative on file    Past Surgical History  Procedure Laterality Date  . Cesarean section      No Known Allergies  Current Facility-Administered Medications  Medication Dose Route Frequency Provider Last Rate Last Dose  . 0.9 %  sodium chloride infusion   Intravenous Continuous Orpah Greek, MD 125 mL/hr at 08/09/13 1109    . alum & mag hydroxide-simeth (MAALOX/MYLANTA) 200-200-20 MG/5ML suspension 30 mL  30 mL Oral PRN Jonetta Osgood, MD   30 mL at 08/09/13 1124   Current Outpatient  Prescriptions  Medication Sig Dispense Refill  . calcium carbonate (TUMS - DOSED IN MG ELEMENTAL CALCIUM) 500 MG chewable tablet Chew 2 tablets by mouth 2 (two) times daily as needed for indigestion or heartburn.      . levothyroxine (SYNTHROID, LEVOTHROID) 75 MCG tablet Take 75 mcg by mouth daily.          Review of systems complete and found to be negative unless listed above   Physical Exam: Blood pressure 114/73, pulse 70, temperature 98.3 F (36.8 C), temperature source Oral, resp. rate 16, height 5\' 6"  (1.676 m), weight 165 lb (74.844 kg), last menstrual period 08/02/2013, SpO2 100.00%.    General: Well developed, well nourished, NAD  HEENT: OP clear, mucus membranes moist  SKIN: warm, dry. No rashes.  Neuro: No focal deficits  Musculoskeletal: Muscle strength 5/5 all ext  Psychiatric: Mood and affect normal  Neck: No JVD, no carotid bruits, no thyromegaly, no lymphadenopathy.  Lungs:Clear bilaterally, no wheezes, rhonci, crackles  Cardiovascular: Regular rate and rhythm. No murmurs, gallops or rubs.  Abdomen:Soft. Bowel sounds present. Non-tender.  Extremities: No lower extremity edema. Pulses are 2 + in the bilateral DP/PT.   Labs:   Lab Results  Component Value Date   WBC 9.0 08/09/2013   HGB 14.2 08/09/2013   HCT 43.2 08/09/2013   MCV 90.8 08/09/2013  PLT 186 08/09/2013     Recent Labs Lab 08/09/13 1111  NA 143  K 4.0  CL 104  CO2 25  BUN 14  CREATININE 0.76  CALCIUM 9.8  PROT 7.3  BILITOT 0.6  ALKPHOS 102  ALT 12  AST 17  GLUCOSE 89   Lab Results  Component Value Date   TROPONINI <0.30 08/09/2013    EKG: sinus, poor R wave progression precordial leads, unchanged.   ASSESSMENT AND PLAN:   1. Epigastric pain/Chest pain: Her pain has mostly atypical features. No evidence of acute coronary syndrome. Will arrange exercise stress myoview in am to exclude ischemia. If negative, will suggest further GI workup.    Signed: Lauree Chandler,  MD 08/09/2013, 1:03 PM

## 2013-08-09 NOTE — Consult Note (Signed)
Consultation  Referring Provider:[facility]    Primary Care Physician:  No primary provider on file. Primary Gastroenterologist:   none      Reason for Consultation:  Chest pain            HPI:   Brenda Frost is a 48 y.o. female admitted with epigastric pain/chest pain. She was seen in ED a couple of days ago for same pain. The pain is located just below Xiphoid, it radiates around both sides into her back. Pain tends to radiate upward into chest and into her mid back, it waxes and wanes but she feels a constant awareness of pressure. No associated SOB. Nausea in ED but none since. No history of GI problems such as GERD. No dysphagia or odynophagia. She has never had this pain before. Bowels are moving normal. No blood in stool  Past Medical History  Diagnosis Date  . Hypothyroidism   . Hyperlipidemia     Past Surgical History  Procedure Laterality Date  . Cesarean section      Family History  Problem Relation Age of Onset  . CAD Neg Hx    No GI malignancies  History  Substance Use Topics  . Smoking status: Never Smoker   . Smokeless tobacco: Not on file  . Alcohol Use: 1.2 oz/week    2 Cans of beer per week    Prior to Admission medications   Medication Sig Start Date End Date Taking? Authorizing Provider  calcium carbonate (TUMS - DOSED IN MG ELEMENTAL CALCIUM) 500 MG chewable tablet Chew 2 tablets by mouth 2 (two) times daily as needed for indigestion or heartburn.   Yes Historical Provider, MD  levothyroxine (SYNTHROID, LEVOTHROID) 75 MCG tablet Take 75 mcg by mouth daily.     Yes Historical Provider, MD    Current Facility-Administered Medications  Medication Dose Route Frequency Provider Last Rate Last Dose  . 0.9 %  sodium chloride infusion  250 mL Intravenous PRN Jonetta Osgood, MD      . acetaminophen (TYLENOL) tablet 650 mg  650 mg Oral Q6H PRN Shanker Kristeen Mans, MD       Or  . acetaminophen (TYLENOL) suppository 650 mg  650 mg Rectal Q6H PRN  Shanker Kristeen Mans, MD      . albuterol (PROVENTIL) (2.5 MG/3ML) 0.083% nebulizer solution 2.5 mg  2.5 mg Nebulization Q2H PRN Shanker Kristeen Mans, MD      . alum & mag hydroxide-simeth (MAALOX/MYLANTA) 200-200-20 MG/5ML suspension 30 mL  30 mL Oral Q6H PRN Shanker Kristeen Mans, MD      . alum & mag hydroxide-simeth (MAALOX/MYLANTA) 200-200-20 MG/5ML suspension 30 mL  30 mL Oral PRN Jonetta Osgood, MD   30 mL at 08/09/13 1124  . aspirin chewable tablet 81 mg  81 mg Oral Daily Melton Alar, PA-C   81 mg at 08/09/13 1407  . diphenhydrAMINE (BENADRYL) capsule 25 mg  25 mg Oral QHS PRN Melton Alar, PA-C      . enoxaparin (LOVENOX) injection 40 mg  40 mg Subcutaneous Q24H Melton Alar, PA-C      . gi cocktail (Maalox,Lidocaine,Donnatal)  30 mL Oral BID PRN Melton Alar, PA-C      . guaiFENesin-dextromethorphan (ROBITUSSIN DM) 100-10 MG/5ML syrup 5 mL  5 mL Oral Q4H PRN Jonetta Osgood, MD      . Derrill Memo ON 08/10/2013] levothyroxine (SYNTHROID, LEVOTHROID) tablet 75 mcg  75 mcg Oral QAC breakfast Shanker  Kristeen Mans, MD      . morphine 2 MG/ML injection 0.5 mg  0.5 mg Intravenous Q4H PRN Melton Alar, PA-C   0.5 mg at 08/09/13 1447  . nitroGLYCERIN (NITROSTAT) SL tablet 0.4 mg  0.4 mg Sublingual Q5 min PRN Shanker Kristeen Mans, MD      . ondansetron [facility]) tablet 4 mg  4 mg Oral Q6H PRN Shanker Kristeen Mans, MD       Or  . ondansetron (ZOFRAN) injection 4 mg  4 mg Intravenous Q6H PRN Shanker Kristeen Mans, MD      . oxyCODONE (Oxy IR/ROXICODONE) immediate release tablet 5 mg  5 mg Oral Q4H PRN Shanker Kristeen Mans, MD      . pantoprazole (PROTONIX) injection 40 mg  40 mg Intravenous Q24H Shanker Kristeen Mans, MD      . polyethylene glycol (MIRALAX / GLYCOLAX) packet 17 g  17 g Oral Daily Marianne L York, PA-C      . sodium chloride 0.9 % injection 3 mL  3 mL Intravenous Q12H Shanker Kristeen Mans, MD      . sodium chloride 0.9 % injection 3 mL  3 mL Intravenous Q12H Jonetta Osgood, MD   3 mL at 08/09/13  1408  . sodium chloride 0.9 % injection 3 mL  3 mL Intravenous PRN Jonetta Osgood, MD        Allergies as of 08/09/2013  . (No Known Allergies)   Review of Systems:    All systems reviewed and negative except where noted in HPI.     Physical Exam:  Vital signs in last 24 hours: Temp:  [98.3 F (36.8 C)] 98.3 F (36.8 C) (05/21 1038) Pulse Rate:  [70-84] 73 (05/21 1300) Resp:  [12-20] 20 (05/21 1300) BP: (110-132)/(58-73) 117/58 mmHg (05/21 1300) SpO2:  [98 %-100 %] 100 % (05/21 1300) Weight:  [165 lb (74.844 kg)] 165 lb (74.844 kg) (05/21 1038)   General:   Pleasant female in NAD Head:  Normocephalic and atraumatic. Eyes:   No icterus.   Conjunctiva pink. Ears:  Normal auditory acuity. Neck:  Supple; no masses felt Lungs:  Respirations even and unlabored. Lungs clear to auscultation bilaterally.   No wheezes, crackles, or rhonchi.  Heart:  Regular rate and rhythm. Abdomen:  Soft, nondistended, nontender. Normal bowel sounds. No appreciable masses or hepatomegaly.  Msk:  Symmetrical without gross deformities.  Extremities:  Without edema. Neurologic:  Alert and  oriented x4;  grossly normal neurologically. Skin:  Intact without significant lesions or rashes. A few small bruises on upper extremities Cervical Nodes:  No significant cervical adenopathy. Psych:  Alert and cooperative. Normal affect.  LAB RESULTS:  Recent Labs  08/07/13 1237 08/09/13 1111  WBC 7.3 9.0  HGB 14.3 14.2  HCT 42.6 43.2  PLT 213 186   BMET  Recent Labs  08/07/13 1237 08/09/13 1111  NA 140 143  K 4.4 4.0  CL 102 104  CO2 26 25  GLUCOSE 89 89  BUN 17 14  CREATININE 0.79 0.76  CALCIUM 9.5 9.8   LFT  Recent Labs  08/09/13 1111  PROT 7.3  ALBUMIN 3.7  AST 17  ALT 12  ALKPHOS 102  BILITOT 0.6   STUDIES: US Abdomen Complete  08/09/2013   CLINICAL DATA:  Upper abdominal pain, chest and epigastric pain  EXAM: ULTRASOUND ABDOMEN COMPLETE  COMPARISON:  None  FINDINGS:  Gallbladder:  Contracted, wall mildly prominent though this could be an artifact from inadequate distention.  No gross evidence of gallstones or sonographic Murphy sign. No pericholecystic fluid.  Common bile duct:  Diameter: Mildly dilated 7-8 mm diameter  Liver:  Normal appearance  IVC:  Normal appearance  Pancreas:  Normal appearance  Spleen:  Normal appearance, 6.3 cm length  Right Kidney:  Length: 10.6 cm.  Normal morphology without mass or hydronephrosis.  Left Kidney:  Length: 9.5 cm.  Normal morphology without mass or hydronephrosis.  Abdominal aorta:  Normal [place]  Other findings:  No free-fluid  IMPRESSION: Contracted gallbladder without definite visualization of gallstones.  Mildly prominent CBD 7-8 mm diameter, recommend correlation with LFTs.   Electronically Signed   By: Lavonia Dana M.D.   On: 08/09/2013 12:25   PREVIOUS ENDOSCOPIES:            none   Impression / Plan:   108. 48 year old female with acute epigastric / chest pain. She had episode on Tuesday and again this am. All day yesterday she just felt sore but no significant pain. Labs normal. U/S unremarkable except for ? Mildly prominent CBD. Symptoms atypical for GERD. Esophageal spasm seems unlikely based on symptoms. Patient for myoview stress test in am. If cardiac workup is negative she may need EGD, though I expect it would be of low yield   2. Recent fatigue. Need to exclude cardiac disease  3. Brusing, new problem. No NSAID use. Platelets okay. No personal or family history of bleeding disorder.    Thanks   LOS: 0 days   Willia Craze  08/09/2013, 3:01 PM     Attending physician's note   I have taken a history, examined the patient and reviewed the chart. I agree with the Advanced Practitioner's note, impression and recommendations. Acute epigastric/chest pain radiating to her back. Pain was not relieved with a GI cocktail and was relieved with morphine. Mildly prominent CBD with normal LFTs so this finding does  not appear to be clinically significant. Symptoms are atypical for a GI cause. Consider EGD and/or CCK HIDA if diagnosis remains unclear. Myoview is scheduled for tomorrow.   Ladene Artist, MD [facility]

## 2013-08-10 ENCOUNTER — Encounter (HOSPITAL_COMMUNITY): Payer: 59

## 2013-08-10 ENCOUNTER — Observation Stay (HOSPITAL_COMMUNITY): Payer: 59

## 2013-08-10 DIAGNOSIS — R079 Chest pain, unspecified: Secondary | ICD-10-CM

## 2013-08-10 DIAGNOSIS — E785 Hyperlipidemia, unspecified: Secondary | ICD-10-CM

## 2013-08-10 LAB — BASIC METABOLIC PANEL
BUN: 13 mg/dL (ref 6–23)
CALCIUM: 9.1 mg/dL (ref 8.4–10.5)
CO2: 24 meq/L (ref 19–32)
Chloride: 106 mEq/L (ref 96–112)
Creatinine, Ser: 0.86 mg/dL (ref 0.50–1.10)
GFR calc Af Amer: >90 mL/min (ref >90)
GFR calc non Af Amer: 80 mL/min — ABNORMAL LOW (ref >90)
GLUCOSE: 98 mg/dL (ref 70–99)
Potassium: 4.1 mEq/L (ref 3.7–5.3)
Sodium: 140 mEq/L (ref 137–147)

## 2013-08-10 LAB — CBC
HEMATOCRIT: 37.3 % (ref 36.0–46.0)
Hemoglobin: 12.5 g/dL (ref 12.0–15.0)
MCH: 30.3 pg (ref 26.0–34.0)
MCHC: 33.5 g/dL (ref 30.0–36.0)
MCV: 90.3 fL (ref 78.0–100.0)
PLATELETS: 168 10*3/uL (ref 150–400)
RBC: 4.13 MIL/uL (ref 3.87–5.11)
RDW: 13.5 % (ref 11.5–15.5)
WBC: 5.7 10*3/uL (ref 4.0–10.5)

## 2013-08-10 LAB — TROPONIN I: Troponin I: <0.3 ng/mL (ref <0.30)

## 2013-08-10 MED ORDER — NITROGLYCERIN 0.4 MG SL SUBL: 0.4000 mg | SUBLINGUAL_TABLET | SUBLINGUAL | Status: DC | PRN

## 2013-08-10 MED ORDER — TECHNETIUM TC 99M TETROFOSMIN IV KIT
30.0000 | PACK | Freq: Once | INTRAVENOUS | Status: AC | PRN
  Administered 2013-08-10: 30 via INTRAVENOUS

## 2013-08-10 MED ORDER — PANTOPRAZOLE SODIUM 40 MG PO TBEC: 40.0000 mg | DELAYED_RELEASE_TABLET | Freq: Every day | ORAL | Status: DC

## 2013-08-10 MED ORDER — TECHNETIUM TC 99M SESTAMIBI GENERIC - CARDIOLITE
10.0000 | Freq: Once | INTRAVENOUS | Status: AC | PRN
  Administered 2013-08-10: 10 via INTRAVENOUS

## 2013-08-10 NOTE — Progress Notes (Signed)
UR completed 

## 2013-08-10 NOTE — Progress Notes (Signed)
Pt denies pain, d/c'd with family to private vehicle. Assessment unchanged from this am

## 2013-08-10 NOTE — Progress Notes (Signed)
Subjective:  No further CP/back pain  Objective:  Temp:  [98.1 F (36.7 C)] 98.1 F (36.7 C) (05/22 8325) Pulse Rate:  [71-162] 162 (05/22 0928) Resp:  [18-20] 18 (05/22 0637) BP: (110-151)/(65-85) 151/84 mmHg (05/22 0928) SpO2:  [97 %-100 %] 97 % (05/22 0637) Weight change:   Intake/Output from previous day: 05/21 0701 - 05/22 0700 In: 240 [P.O.:240] Out: 75 [Urine:75]  Intake/Output from this shift:    Physical Exam: General appearance: alert and no distress Neck: no adenopathy, no carotid bruit, no JVD, supple, symmetrical, trachea midline and thyroid not enlarged, symmetric, no tenderness/mass/nodules Lungs: clear to auscultation bilaterally Heart: regular rate and rhythm, S1, S2 normal, no murmur, click, rub or gallop Extremities: extremities normal, atraumatic, no cyanosis or edema  Lab Results: Results for orders placed during the hospital encounter of 08/09/13 (from the past 48 hour(s))  CBC WITH DIFFERENTIAL     Status: None   Collection Time    08/09/13 11:11 AM      Result Value Ref Range   WBC 9.0  4.0 - 10.5 K/uL   RBC 4.76  3.87 - 5.11 MIL/uL   Hemoglobin 14.2  12.0 - 15.0 g/dL   HCT 43.2  36.0 - 46.0 %   MCV 90.8  78.0 - 100.0 fL   MCH 29.8  26.0 - 34.0 pg   MCHC 32.9  30.0 - 36.0 g/dL   RDW 13.2  11.5 - 15.5 %   Platelets 186  150 - 400 K/uL   Neutrophils Relative % 72  43 - 77 %   Neutro Abs 6.5  1.7 - 7.7 K/uL   Lymphocytes Relative 21  12 - 46 %   Lymphs Abs 1.9  0.7 - 4.0 K/uL   Monocytes Relative 7  3 - 12 %   Monocytes Absolute 0.6  0.1 - 1.0 K/uL   Eosinophils Relative 0  0 - 5 %   Eosinophils Absolute 0.0  0.0 - 0.7 K/uL   Basophils Relative 0  0 - 1 %   Basophils Absolute 0.0  0.0 - 0.1 K/uL  COMPREHENSIVE METABOLIC PANEL     Status: None   Collection Time    08/09/13 11:11 AM      Result Value Ref Range   Sodium 143  137 - 147 mEq/L   Potassium 4.0  3.7 - 5.3 mEq/L   Chloride 104  96 - 112 mEq/L   CO2 25  19 - 32 mEq/L   Glucose, Bld 89  70 - 99 mg/dL   BUN 14  6 - 23 mg/dL   Creatinine, Ser 0.76  0.50 - 1.10 mg/dL   Calcium 9.8  8.4 - 10.5 mg/dL   Total Protein 7.3  6.0 - 8.3 g/dL   Albumin 3.7  3.5 - 5.2 g/dL   AST 17  0 - 37 U/L   ALT 12  0 - 35 U/L   Alkaline Phosphatase 102  39 - 117 U/L   Total Bilirubin 0.6  0.3 - 1.2 mg/dL   GFR calc non Af Amer >90  >90 mL/min   GFR calc Af Amer >90  >90 mL/min   Comment: (NOTE)     The eGFR has been calculated using the CKD EPI equation.     This calculation has not been validated in all clinical situations.     eGFR's persistently <90 mL/min signify possible Chronic Kidney     Disease.  TROPONIN I     Status:  None   Collection Time    08/09/13 11:11 AM      Result Value Ref Range   Troponin I <0.30  <0.30 ng/mL   Comment:            Due to the release kinetics of cTnI,     a negative result within the first hours     of the onset of symptoms does not rule out     myocardial infarction with certainty.     If myocardial infarction is still suspected,     repeat the test at appropriate intervals.  LIPASE, BLOOD     Status: None   Collection Time    08/09/13 11:11 AM      Result Value Ref Range   Lipase 57  11 - 59 U/L  URINALYSIS, ROUTINE W REFLEX MICROSCOPIC     Status: None   Collection Time    08/09/13 12:00 PM      Result Value Ref Range   Color, Urine YELLOW  YELLOW   APPearance CLEAR  CLEAR   Specific Gravity, Urine 1.009  1.005 - 1.030   pH 7.5  5.0 - 8.0   Glucose, UA NEGATIVE  NEGATIVE mg/dL   Hgb urine dipstick NEGATIVE  NEGATIVE   Bilirubin Urine NEGATIVE  NEGATIVE   Ketones, ur NEGATIVE  NEGATIVE mg/dL   Protein, ur NEGATIVE  NEGATIVE mg/dL   Urobilinogen, UA 0.2  0.0 - 1.0 mg/dL   Nitrite NEGATIVE  NEGATIVE   Leukocytes, UA NEGATIVE  NEGATIVE   Comment: MICROSCOPIC NOT DONE ON URINES WITH NEGATIVE PROTEIN, BLOOD, LEUKOCYTES, NITRITE, OR GLUCOSE <1000 mg/dL.  TSH     Status: None   Collection Time    08/09/13  1:05 PM       Result Value Ref Range   TSH 2.840  0.350 - 4.500 uIU/mL   Comment: Please note change in reference range.  TSH     Status: None   Collection Time    08/09/13  6:00 PM      Result Value Ref Range   TSH 4.300  0.350 - 4.500 uIU/mL   Comment: Please note change in reference range.  TROPONIN I     Status: None   Collection Time    08/09/13  6:00 PM      Result Value Ref Range   Troponin I <0.30  <0.30 ng/mL   Comment:            Due to the release kinetics of cTnI,     a negative result within the first hours     of the onset of symptoms does not rule out     myocardial infarction with certainty.     If myocardial infarction is still suspected,     repeat the test at appropriate intervals.  TROPONIN I     Status: None   Collection Time    08/10/13  1:34 AM      Result Value Ref Range   Troponin I <0.30  <0.30 ng/mL   Comment:            Due to the release kinetics of cTnI,     a negative result within the first hours     of the onset of symptoms does not rule out     myocardial infarction with certainty.     If myocardial infarction is still suspected,     repeat the test at appropriate intervals.  BASIC METABOLIC PANEL  Status: Abnormal   Collection Time    08/10/13  1:34 AM      Result Value Ref Range   Sodium 140  137 - 147 mEq/L   Potassium 4.1  3.7 - 5.3 mEq/L   Chloride 106  96 - 112 mEq/L   CO2 24  19 - 32 mEq/L   Glucose, Bld 98  70 - 99 mg/dL   BUN 13  6 - 23 mg/dL   Creatinine, Ser 0.86  0.50 - 1.10 mg/dL   Calcium 9.1  8.4 - 10.5 mg/dL   GFR calc non Af Amer 80 (*) >90 mL/min   GFR calc Af Amer >90  >90 mL/min   Comment: (NOTE)     The eGFR has been calculated using the CKD EPI equation.     This calculation has not been validated in all clinical situations.     eGFR's persistently <90 mL/min signify possible Chronic Kidney     Disease.  CBC     Status: None   Collection Time    08/10/13  1:34 AM      Result Value Ref Range   WBC 5.7  4.0 - 10.5 K/uL    RBC 4.13  3.87 - 5.11 MIL/uL   Hemoglobin 12.5  12.0 - 15.0 g/dL   HCT 37.3  36.0 - 46.0 %   MCV 90.3  78.0 - 100.0 fL   MCH 30.3  26.0 - 34.0 pg   MCHC 33.5  30.0 - 36.0 g/dL   RDW 13.5  11.5 - 15.5 %   Platelets 168  150 - 400 K/uL    Imaging: Imaging results have been reviewed  Assessment/Plan:   1. Principal Problem: 2.   Chest pain 3. Active Problems: 4.   Hypothyroidism 5.   Abdominal pain, epigastric 6.   Nonspecific (abnormal) findings on radiological and other examination of biliary tract 7.   Time Spent Directly with Patient:  20 minutes  Length of Stay:  LOS: 1 day   Pt admitted with atypical CP/back pain. Enz neg. EKG w/o acute changes. Myoview low risk, non ischemic. Doubt cardiac etiology. OK for DC from our point of view. She can F/U with her PCP  Lorretta Harp 08/10/2013, 2:19 PM

## 2013-08-10 NOTE — Discharge Summary (Signed)
Physician Discharge Summary  KLAIRA PESCI BSJ:[id number] DOB: 03/12/1966 DOA: 08/09/2013  PCP: Orpah Melter, MD  Admit date: 08/09/2013 Discharge date: 08/10/2013  Time spent: 40 minutes  Recommendations for Outpatient Follow-up:  1. Hospitalized with atypical chest pain.  Would recommend hepatobiliary scan, EGD and further work up if pain recurs 2.   Follow up with Santina Evans to continue outpatient evaluation.  Discharge Diagnoses:  Principal Problem:   Chest pain Active Problems:   Hypothyroidism   Abdominal pain, epigastric   Nonspecific (abnormal) findings on radiological and other examination of biliary tract   Discharge Condition: stable.  Diet recommendation: regular   History of present illness:  Brenda Frost is a 48 y.o. female with virtually no past medical history, other than hypothyroidism, who presented to the ER with chest pain. The patient first had the pain on Tuesday 5/19 after drinking coffee. It occurs in the epigastrum/substernal chest area. It is constant and radiates straight to the back and down the left arm. She was treated in the ED on Tuesday with nitro and GI cocktail. Her pain improved but then returned once she went home Tuesday night. This morning the pain recurred after drinking coffee once again. Brenda Frost described epigastric burning and squeezing and a twisting sensation in her chest that completely stopped her from being able to function. She has never had pain like this before. Over the last few days Brenda Frost has also been increasingly fatigued and feels as though she is "coming down with something" - but she has not noticed specific symptoms such as fever / cough. She denies nsaid use. She rarely drinks alcohol. She has not had vomiting, hematochezia or melena. Her labs in the ER appear normal.    Hospital Course:   Substernal chest pain vs Epigastric pain Uncertain etiology.  Pain is now virtually resolved. Troponins are normal,  EKG normal. Myoview stress test is negative for ischemia Cardiology felt this was unlikely to be cardiac in origin. TSH normal. Abdominal u/s showed mildly prominent CBD,  Contracted gall bladder with out evidence of stones. LFTs normal, lipase normal. [facility] [place] [facility] consulted and will schedule follow up outpatient. Patient prefers further work up including EGD to be done in the outpatient setting. We will discharge Brenda Frost with prescriptions for protonix and SL nitrostat.  Hypothyroidism Continue Synthroid.  Stable.  Procedures:  Myoview stress test  Consultations:  Cardiology  Gastroenterology  Discharge Exam: Filed Vitals:   08/10/13 0928  BP: 151/84  Pulse: 162  Temp:   Resp:     General: wd, wn, female, nad, well appearing, smiling. Daughter at bedside. Neck:  Supple, no LAD Cardiovascular: rrr no m/r/g, no pain on palpation Respiratory: cta, no w/c/r, no increased work of breathing, no pain with inspiration Abdomen:  Soft, slightly tender to palpation in the epigastrum, no masses, good bs. Extremities:  No LEE,  Ambulates with ease Skin:  No rashes, bruises or lesions.  Discharge Instructions       Discharge Instructions   Diet general    Complete by:  As directed      Increase activity slowly    Complete by:  As directed             Medication List         calcium carbonate 500 MG chewable tablet  Commonly known as:  TUMS - dosed in mg elemental calcium  Chew 2 tablets by mouth 2 (two) times daily as needed for indigestion or heartburn.  levothyroxine 75 MCG tablet  Commonly known as:  SYNTHROID, LEVOTHROID  Take 75 mcg by mouth daily.     nitroGLYCERIN 0.4 MG SL tablet  Commonly known as:  NITROSTAT  Place 1 tablet (0.4 mg total) under the tongue every 5 (five) minutes as needed for chest pain.     pantoprazole 40 MG tablet  Commonly known as:  PROTONIX  Take 1 tablet (40 mg total) by mouth daily.       No Known  Allergies Follow-up Information   Follow up with Orpah Melter, MD In 2 weeks.   Specialty:  Family Medicine   Contact information:   [address] [place] [address] [phone]       Follow up with Tye Savoy, NP In 2 weeks. (Nurse Practioner for Dr. Lucio Edward.)    Specialty:  Nurse Practitioner   Contact information:   [address] [place] [address] [phone]        The results of significant diagnostics from this hospitalization (including imaging, microbiology, ancillary and laboratory) are listed below for reference.    Significant Diagnostic Studies: US Abdomen Complete  08/09/2013   CLINICAL DATA:  Upper abdominal pain, chest and epigastric pain  EXAM: ULTRASOUND ABDOMEN COMPLETE  COMPARISON:  None  FINDINGS: Gallbladder:  Contracted, wall mildly prominent though this could be an artifact from inadequate distention. No gross evidence of gallstones or sonographic Murphy sign. No pericholecystic fluid.  Common bile duct:  Diameter: Mildly dilated 7-8 mm diameter  Liver:  Normal appearance  IVC:  Normal appearance  Pancreas:  Normal appearance  Spleen:  Normal appearance, 6.3 cm length  Right Kidney:  Length: 10.6 cm.  Normal morphology without mass or hydronephrosis.  Left Kidney:  Length: 9.5 cm.  Normal morphology without mass or hydronephrosis.  Abdominal aorta:  Normal Calla  Other findings:  No free-fluid  IMPRESSION: Contracted gallbladder without definite visualization of gallstones.  Mildly prominent CBD 7-8 mm diameter, recommend correlation with LFTs.   Electronically Signed   By: Lavonia Dana M.D.   On: 08/09/2013 12:25   Nm Myocar Multi W/spect W/wall Motion / Ef  08/10/2013   CLINICAL DATA:  48 year old female with chest pain, negative troponin.  EXAM: MYOCARDIAL IMAGING WITH SPECT (REST AND EXERCISE)  GATED LEFT VENTRICULAR WALL MOTION STUDY  LEFT VENTRICULAR EJECTION FRACTION  TECHNIQUE: Standard myocardial SPECT imaging was performed  after resting intravenous injection of 10 mCi Tc-68m sestamibi. Subsequently, exercise tolerance test was performed by the patient under the supervision of the Cardiology staff. At peak-stress, 30 mCi Tc-43m sestamibi was injected intravenously and standard myocardial SPECT imaging was performed. Quantitative gated imaging was also performed to evaluate left ventricular wall motion, and estimate left ventricular ejection fraction.  COMPARISON:  None.  FINDINGS: There is homogeneous radiotracer uptake at both rest and stress with no evidence of ischemia. Subtle breast attenuation artifact. Normal ejection fraction with no wall motion abnormalities.  IMPRESSION: Low risk nuclear stress test with no evidence of ischemia, normal EF.   Electronically Signed   By: Candee Furbish   On: 08/10/2013 13:53    Labs: Basic Metabolic Panel:  Recent Labs Lab 08/07/13 1237 08/09/13 1111 08/10/13 0134  NA 140 143 140  K 4.4 4.0 4.1  CL 102 104 106  CO2 26 25 24   GLUCOSE 89 89 98  BUN 17 14 13   CREATININE 0.79 0.76 0.86  CALCIUM 9.5 9.8 9.1   Liver Function Tests:  Recent Labs  Lab 08/09/13 1111  AST 17  ALT 12  ALKPHOS 102  BILITOT 0.6  PROT 7.3  ALBUMIN 3.7    Recent Labs Lab 08/09/13 1111  LIPASE 57   CBC:  Recent Labs Lab 08/07/13 1237 08/09/13 1111 08/10/13 0134  WBC 7.3 9.0 5.7  NEUTROABS  --  6.5  --   HGB 14.3 14.2 12.5  HCT 42.6 43.2 37.3  MCV 90.3 90.8 90.3  PLT 213 186 168   Cardiac Enzymes:  Recent Labs Lab 08/09/13 1111 08/09/13 1800 08/10/13 0134  TROPONINI <0.30 <0.30 <0.30     Signed:  Karen Kitchens [phone]  [facility] 08/10/2013, 2:48 PM  Attending Patient seen and examined, agree with the assessment and plan as outlined above. No further epigastric/chest pain-last episode yesterday afternoon,nuclear stress test negative, stable for discharge. Patient prefers further GI w/u to be done in the outpatient setting.  Nena Alexander  MD

## 2013-08-10 NOTE — Progress Notes (Signed)
    Nuclear stress test just resulted and no evidence for ischemia. EF normal. I spoke with patient and she hasn't had any pain today. Patient would prefer to hold off on endoscopic evaluation but will call our office for recurrent pain. Consider CCK-HIDA and EGD if symptoms persist.   Tye Savoy, NP

## 2013-08-10 NOTE — ED Provider Notes (Signed)
CSN: [id number]     Arrival date & time 08/07/13  1221 History   First MD Initiated Contact with Patient 08/07/13 1254     Chief Complaint  Patient presents with  . Abdominal Pain     (Consider location/radiation/quality/duration/timing/severity/associated sxs/prior Treatment) HPI  47 year old female with epigastric pain. Radiation into her back. Onset shortly before arrival. 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 Currently feels better, but still has a sensation like there is a 1 throat. No history similar symptoms. No fevers or chills. No cough. No shortness of breath. Denies or vomiting. No diaphoresis. There is alcohol intake. No urinary complaints. Surgical history significant for cesarean section. She still has her gallbladder and appendix.  Past Medical History  Diagnosis Date  . Hypothyroidism   . Hyperlipidemia    Past Surgical History  Procedure Laterality Date  . Cesarean section     Family History  Problem Relation Age of Onset  . CAD Neg Hx    History  Substance Use Topics  . Smoking status: Never Smoker   . Smokeless tobacco: Not on file  . Alcohol Use: 1.2 oz/week    2 Cans of beer per week   OB History   Grav Para Term Preterm Abortions TAB SAB Ect Mult Living                 Review of Systems  All systems reviewed and negative, other than as noted in HPI.   Allergies  Review of patient's allergies indicates no known allergies.  Home Medications   Prior to Admission medications   Medication Sig Start Date End Date Taking? Authorizing Provider  levothyroxine (SYNTHROID, LEVOTHROID) 75 MCG tablet Take 75 mcg by mouth daily.     Yes Historical Provider, MD  calcium carbonate (TUMS - DOSED IN MG ELEMENTAL CALCIUM) 500 MG chewable tablet Chew 2 tablets by mouth 2 (two) times daily as needed for indigestion or heartburn.    Historical Provider, MD  nitroGLYCERIN (NITROSTAT) 0.4 MG SL  tablet Place 1 tablet (0.4 mg total) under the tongue every 5 (five) minutes as needed for chest pain. 08/10/13   Bobby Rumpf York, PA-C  pantoprazole (PROTONIX) 40 MG tablet Take 1 tablet (40 mg total) by mouth daily. 08/10/13   Marianne L York, PA-C   BP 108/72  Pulse 73  Temp(Src) 98.1 F (36.7 C) (Oral)  Resp 19  Ht 5\' 6"  (1.676 m)  Wt 165 lb (74.844 kg)  BMI 26.64 kg/m2  SpO2 99% Physical Exam  Nursing note and vitals reviewed. Constitutional: She appears well-developed and well-nourished. No distress.  HENT:  Head: Normocephalic and atraumatic.  Eyes: Conjunctivae are normal. Right eye exhibits no discharge. Left eye exhibits no discharge.  Neck: Neck supple.  Cardiovascular: Normal rate, regular rhythm and normal heart sounds.  Exam reveals no gallop and no friction rub.   No murmur heard. Pulmonary/Chest: Effort normal and breath sounds normal. No respiratory distress.  Abdominal: Soft. She exhibits no distension. There is no tenderness.  Genitourinary:  No CVA tenderness  Musculoskeletal: She exhibits no edema and no tenderness.  Neurological: She is alert.  Skin: Skin is warm and dry.  Psychiatric: She has a normal mood and affect. Her behavior is normal. Thought content normal.    ED Course  Procedures (including critical care time) Labs Review Labs Reviewed  [place], ED    Imaging Review  EKG Interpretation   Date/Time:  Tuesday Aug 07 2013 12:29:35 EDT Ventricular Rate:  72 PR Interval:  144 QRS Duration: 68 QT Interval:  386 QTC Calculation: 422 R Axis:   88 Text Interpretation:  Sinus tachycardia with Blocked Premature atrial  complexes Septal infarct , age undetermined Abnormal ECG ED PHYSICIAN  INTERPRETATION AVAILABLE IN [facility] Confirmed by TEST, Record  ([id number]) on 08/09/2013 8:11:56 AM      MDM   Final diagnoses:  Epigastric pain  Back pain    48 year old female with epigastric pain which  radiates into her back. Has resolved at this point. Her abdominal exam is benign. Atypical for ACS. EKG without ischemic changes. Troponin is normal. Not classic biliary colic symptoms. No tenderness on exam. Doubt pancreatitis. Doubt renal colic. Doubt AAA. Esophageal spasm? Low suspicion for emergent process. I feel she is stable for discharge at this time. Return precautions were discussed. Outpatient followup.    Virgel Manifold, MD 08/10/13 [phone]

## 2013-09-08 ENCOUNTER — Emergency Department (HOSPITAL_BASED_OUTPATIENT_CLINIC_OR_DEPARTMENT_OTHER)
Admission: EM | Admit: 2013-09-08 | Discharge: 2013-09-08 | Disposition: A | Discharge status: Home / Self Care | Payer: 59 | Attending: Emergency Medicine | Admitting: Emergency Medicine

## 2013-09-08 ENCOUNTER — Encounter (HOSPITAL_BASED_OUTPATIENT_CLINIC_OR_DEPARTMENT_OTHER): Payer: Self-pay | Admitting: Emergency Medicine

## 2013-09-08 DIAGNOSIS — Y9389 Activity, other specified: Secondary | ICD-10-CM | POA: Insufficient documentation

## 2013-09-08 DIAGNOSIS — Z79899 Other long term (current) drug therapy: Secondary | ICD-10-CM | POA: Insufficient documentation

## 2013-09-08 DIAGNOSIS — IMO0002 Reserved for concepts with insufficient information to code with codable children: Secondary | ICD-10-CM | POA: Insufficient documentation

## 2013-09-08 DIAGNOSIS — W19XXXA Unspecified fall, initial encounter: Secondary | ICD-10-CM

## 2013-09-08 DIAGNOSIS — Y9289 Other specified places as the place of occurrence of the external cause: Secondary | ICD-10-CM | POA: Insufficient documentation

## 2013-09-08 DIAGNOSIS — S93401A Sprain of unspecified ligament of right ankle, initial encounter: Secondary | ICD-10-CM

## 2013-09-08 DIAGNOSIS — S93409A Sprain of unspecified ligament of unspecified ankle, initial encounter: Secondary | ICD-10-CM | POA: Insufficient documentation

## 2013-09-08 DIAGNOSIS — E039 Hypothyroidism, unspecified: Secondary | ICD-10-CM | POA: Insufficient documentation

## 2013-09-08 DIAGNOSIS — X500XXA Overexertion from strenuous movement or load, initial encounter: Secondary | ICD-10-CM | POA: Insufficient documentation

## 2013-09-08 DIAGNOSIS — W010XXA Fall on same level from slipping, tripping and stumbling without subsequent striking against object, initial encounter: Secondary | ICD-10-CM | POA: Insufficient documentation

## 2013-09-08 DIAGNOSIS — M533 Sacrococcygeal disorders, not elsewhere classified: Secondary | ICD-10-CM

## 2013-09-08 MED ORDER — TRAMADOL HCL 50 MG PO TABS: 50.0000 mg | ORAL_TABLET | Freq: Four times a day (QID) | ORAL | Status: DC | PRN

## 2013-09-08 MED ORDER — CYCLOBENZAPRINE HCL 10 MG PO TABS: 10.0000 mg | ORAL_TABLET | Freq: Two times a day (BID) | ORAL | Status: DC | PRN

## 2013-09-08 NOTE — ED Notes (Signed)
Patient fell on Thursday morning and hit her tailbone on concrete. States that she twisted her right ankle as well. Pain in her back has worsened.

## 2013-09-08 NOTE — ED Provider Notes (Signed)
CSN: [id number]     Arrival date & time 09/08/13  1002 History   First MD Initiated Contact with Patient 09/08/13 1053     Chief Complaint  Patient presents with  . Back Injury     (Consider location/radiation/quality/duration/timing/severity/associated sxs/prior Treatment) HPI Comments: Brenda Frost is a 48 y.o. healthy female who presents to the ED today s/p tripping over a floor mat at the gym and twisting her R ankle, and subsequently falling on her buttocks last Thursday 09/06/13. Complains of tailbone pain, moderate in severity, relieved with ibuprofen but today it has not been helping as much. Does not radiate, no incontinence of urine/stool, no perianal tingling or weakness in her legs. Reports that her right ankle swelling up, but has since gone down and no longer feels painful. Denies HA, LOC, CP, SOB, hematochezia or melena, abd pain, N/V/D/C, myalgias or arthralgias aside from her tailbone. This is keeping her from being able to sit or walk much.   Patient is a 48 y.o. female presenting with back pain and ankle pain. The history is provided by the patient. No language interpreter was used.  Back Pain Pain location: tailbone. Quality:  Aching Radiates to:  Does not radiate Pain severity:  Moderate Worse during: worse with sitting. Onset quality:  Sudden Duration:  2 days Timing:  Constant Progression:  Unchanged Chronicity:  New Context: falling   Relieved by:  Ibuprofen Worsened by:  Ambulation, bending and sitting Ineffective treatments:  None tried Associated symptoms: no abdominal pain, no bladder incontinence, no bowel incontinence, no chest pain, no dysuria, no fever, no headaches, no leg pain, no numbness, no paresthesias, no pelvic pain, no perianal numbness, no tingling and no weakness   Ankle Pain Location:  Ankle Time since incident:  2 days Injury: yes   Mechanism of injury: fall   Fall:    Fall occurred:  Tripped   Height of fall:  Tripped over 1-2 inch  floor mat   Impact surface:  Product manager of impact:  Buttocks   Entrapped after fall: no   Ankle location:  R ankle Pain details:    Quality:  Dull   Radiates to:  Does not radiate   Severity:  Mild   Onset quality:  Sudden   Duration:  2 days   Timing:  Rare   Progression:  Resolved Chronicity:  New Dislocation: no   Foreign body present:  No foreign bodies Tetanus status:  Up to date Prior injury to area:  No Relieved by:  Ice, immobilization and NSAIDs Worsened by:  Nothing tried Ineffective treatments:  Ice, immobilization and NSAIDs Associated symptoms: back pain and swelling   Associated symptoms: no decreased ROM, no fever, no muscle weakness, no neck pain, no numbness, no stiffness and no tingling   Risk factors: no concern for non-accidental trauma     Past Medical History  Diagnosis Date  . Hypothyroidism   . Hyperlipidemia    Past Surgical History  Procedure Laterality Date  . Cesarean section     Family History  Problem Relation Age of Onset  . CAD Neg Hx    History  Substance Use Topics  . Smoking status: Never Smoker   . Smokeless tobacco: Not on file  . Alcohol Use: 1.2 oz/week    2 Cans of beer per week   OB History   Grav Para Term Preterm Abortions TAB SAB Ect Mult Living  Review of Systems  Constitutional: Negative for fever and activity change.  Eyes: Negative for visual disturbance.  Respiratory: Negative for cough and shortness of breath.   Cardiovascular: Negative for chest pain.  Gastrointestinal: Negative for nausea, vomiting, abdominal pain, diarrhea, constipation, blood in stool, anal bleeding, rectal pain and bowel incontinence.  Genitourinary: Negative for bladder incontinence, dysuria, hematuria and pelvic pain.  Musculoskeletal: Positive for back pain and joint swelling (right ankle, mild). Negative for gait problem, neck pain, neck stiffness and stiffness.  Skin: Negative for wound.  Neurological: Negative  for tingling, weakness, light-headedness, numbness, headaches and paresthesias.  10 Systems reviewed and are negative for acute change except as noted in the HPI.     Allergies  Review of patient's allergies indicates no known allergies.  Home Medications   Prior to Admission medications   Medication Sig Start Date End Date Taking? Authorizing Brenda Frost  calcium carbonate (TUMS - DOSED IN MG ELEMENTAL CALCIUM) 500 MG chewable tablet Chew 2 tablets by mouth 2 (two) times daily as needed for indigestion or heartburn.    Historical Brenda Meader, MD  cyclobenzaprine (FLEXERIL) 10 MG tablet Take 1 tablet (10 mg total) by mouth 2 (two) times daily as needed for muscle spasms. 09/08/13   Brenda Strupp Camprubi-Soms, PA-C  levothyroxine (SYNTHROID, LEVOTHROID) 75 MCG tablet Take 75 mcg by mouth daily.      Historical Brenda Congrove, MD  nitroGLYCERIN (NITROSTAT) 0.4 MG SL tablet Place 1 tablet (0.4 mg total) under the tongue every 5 (five) minutes as needed for chest pain. 08/10/13   Brenda Rumpf York, PA-C  pantoprazole (PROTONIX) 40 MG tablet Take 1 tablet (40 mg total) by mouth daily. 08/10/13   Brenda Rumpf [place], PA-C  traMADol (ULTRAM) 50 MG tablet Take 1 tablet (50 mg total) by mouth every 6 (six) hours as needed for moderate pain or severe pain. 09/08/13   Brenda Strupp Camprubi-Soms, PA-C   BP 130/61  Pulse 74  Temp(Src) 98.4 F (36.9 C) (Oral)  Resp 18  Ht 5\' 6"  (1.676 m)  Wt 165 lb (74.844 kg)  BMI 26.64 kg/m2  SpO2 99%  LMP 08/02/2013 Physical Exam  Nursing note and vitals reviewed. Constitutional: She is oriented to person, place, and time. She appears well-developed and well-nourished. No distress.  WDWN, NAD, appears uncomfortable sitting on edge of seat  HENT:  Head: Normocephalic and atraumatic.  Mouth/Throat: Oropharynx is clear and moist and mucous membranes are normal.  Eyes: Conjunctivae and EOM are normal.  Neck: Normal range of motion. Neck supple. No spinous process tenderness  and no muscular tenderness present. No rigidity. No edema and normal range of motion present.  Cardiovascular: Normal rate and intact distal pulses.   Pulmonary/Chest: Effort normal.  Abdominal: Soft. Normal appearance and bowel sounds are normal. She exhibits no distension. There is no tenderness. There is no rebound and no guarding.  Genitourinary:  Pt declined rectal exam  Musculoskeletal: She exhibits tenderness.       Cervical back: Normal.       Thoracic back: Normal.       Lumbar back: Normal.       Right foot: She exhibits tenderness (lateral aspect) and swelling (minimal). She exhibits normal range of motion, no bony tenderness, normal capillary refill, no crepitus and no deformity.  Non-tender to palpation along all spinous processess and paraspinous muscle regions. Tenderness to direct pressure over the coccyx. No crepitus or deformity felt. Minimal TTP along lateral aspect of R ankle, no bony tenderness or deformity.  FROM with and without resistance, strength 5/5. Minimal swelling and bruising over lateral aspect.   Neurological: She is alert and oriented to person, place, and time. She has normal strength. No sensory deficit. She exhibits normal muscle tone. Coordination normal.  Sensation grossly intact along all regions of BLEs. Coordination and gait WNL, strength WNL.  Skin: Skin is warm, dry and intact. Bruising noted. No abrasion noted.  Minimal bruising noted over lateral aspect of R ankle  Psychiatric: She has a normal mood and affect.    ED Course  Procedures (including critical care time) Labs Review Labs Reviewed - No data to display  Imaging Review No results found.   EKG Interpretation None      MDM   Final diagnoses:  Coccyx pain  Right ankle sprain, initial encounter  Fall, initial encounter    Brenda Frost is a 48 y.o. female who presents after tripping over a floor mat at the gym and twisting her R ankle, and falling on her coccyx. Discussed  with pt that her exam is consistent with a sprained ankle and that based on the [place] ankle rules, no imaging is required at this time. Recommended RICE and Tylenol/Ibuprofen for ankle pain. Also discussed with pt that her coccyx is likely sprained or possibly broken, but that imaging will not change my management at this time. Low clinical suspicion for cauda equina at this time. Pt understands and agrees with plan. Discussed use of foam donut for comfort and slow return to activity over the next few weeks to months, as tolerated. F/up with PCP in 1 week. Given return precautions. Rx for flexeril and ultram for comfort, although advised pt to use Tylenol/Motrin for more substantial relief. Stable for d/c.  BP 130/61  Pulse 74  Temp(Src) 98.4 F (36.9 C) (Oral)  Resp 18  Ht 5\' 6"  (1.676 m)  Wt 165 lb (74.844 kg)  BMI 26.64 kg/m2  SpO2 99%  LMP 08/02/2013    Brenda Sermons Camprubi-Soms, PA-C 09/08/13 1137

## 2013-09-08 NOTE — Discharge Instructions (Signed)
SEEK IMMEDIATE MEDICAL ATTENTION IF: New numbness, tingling, weakness, or problem with the use of your arms or legs.  Severe back pain not relieved with medications.  Change in bowel or bladder control.  Increasing pain in any areas of the body (such as chest or abdominal pain).  Shortness of breath, dizziness or fainting.  Nausea (feeling sick to your stomach), vomiting, fever, or sweats.   Sprain A sprain is a tear in one of the strong, fibrous tissues that connect your bones (ligaments). The severity of the sprain depends on how much of the ligament is torn. The tear can be either partial or complete. CAUSES  Often, sprains are a result of a fall or an injury. The force of the impact causes the fibers of your ligament to stretch beyond their normal length. This excess tension causes the fibers of your ligament to tear. SYMPTOMS  You may have some loss of motion or increased pain within your normal range of motion. Other symptoms include:  Bruising.  Tenderness.  Swelling. DIAGNOSIS  In order to diagnose a sprain, your caregiver will physically examine you to determine how torn the ligament is. Your caregiver may also suggest an X-ray exam to make sure no bones are broken. TREATMENT  If your ligament is only partially torn, treatment usually involves keeping the injured area in a fixed position (immobilization) for a short period. To do this, your caregiver will apply a bandage, cast, or splint to keep the area from moving until it heals. For a partially torn ligament, the healing process usually takes 2 to 3 weeks. If your ligament is completely torn, you may need surgery to reconnect the ligament to the bone or to reconstruct the ligament. After surgery, a cast or splint may be applied and will need to stay on for 4 to 6 weeks while your ligament heals. HOME CARE INSTRUCTIONS  Keep the injured area elevated to decrease swelling.  To ease pain and swelling, apply ice to your joint  twice a day, for 2 to 3 days.  Put ice in a plastic bag.  Place a towel between your skin and the bag.  Leave the ice on for 15 minutes.  Only take over-the-counter or prescription medicine for pain as directed by your caregiver.  Do not leave the injured area unprotected until pain and stiffness go away (usually 3 to 4 weeks).  Do not allow your cast or splint to get wet. Cover your cast or splint with a plastic bag when you shower or bathe. Do not swim.  Your caregiver may suggest exercises for you to do during your recovery to prevent or limit permanent stiffness. SEEK IMMEDIATE MEDICAL CARE IF:  Your cast or splint becomes damaged.  Your pain becomes worse. MAKE SURE YOU:  Understand these instructions.  Will watch your condition.  Will get help right away if you are not doing well or get worse. Document Released: 03/05/2000 Document Revised: 05/31/2011 Document Reviewed: 03/20/2011 [facility] Patient Information 2015 [place], [place]. This information is not intended to replace advice given to you by your health care provider. Make sure you discuss any questions you have with your health care provider.

## 2013-09-10 NOTE — ED Provider Notes (Signed)
Medical screening examination/treatment/procedure(s) were performed by non-physician practitioner and as supervising physician I was immediately available for consultation/collaboration.   EKG Interpretation None       Babette Relic, MD 09/10/13 1427

## 2015-04-03 DIAGNOSIS — Z020 Encounter for examination for admission to educational institution: Secondary | ICD-10-CM | POA: Diagnosis not present

## 2015-04-03 DIAGNOSIS — E785 Hyperlipidemia, unspecified: Secondary | ICD-10-CM | POA: Diagnosis not present

## 2015-04-03 DIAGNOSIS — Z0000 Encounter for general adult medical examination without abnormal findings: Secondary | ICD-10-CM | POA: Diagnosis not present

## 2015-04-03 DIAGNOSIS — Z131 Encounter for screening for diabetes mellitus: Secondary | ICD-10-CM | POA: Diagnosis not present

## 2015-04-03 DIAGNOSIS — E039 Hypothyroidism, unspecified: Secondary | ICD-10-CM | POA: Diagnosis not present

## 2015-06-12 DIAGNOSIS — Z6828 Body mass index (BMI) 28.0-28.9, adult: Secondary | ICD-10-CM | POA: Diagnosis not present

## 2015-06-12 DIAGNOSIS — Z01419 Encounter for gynecological examination (general) (routine) without abnormal findings: Secondary | ICD-10-CM | POA: Diagnosis not present

## 2015-06-12 DIAGNOSIS — Z1231 Encounter for screening mammogram for malignant neoplasm of breast: Secondary | ICD-10-CM | POA: Diagnosis not present

## 2015-07-14 DIAGNOSIS — H0012 Chalazion right lower eyelid: Secondary | ICD-10-CM | POA: Diagnosis not present

## 2015-07-14 DIAGNOSIS — H40033 Anatomical narrow angle, bilateral: Secondary | ICD-10-CM | POA: Diagnosis not present

## 2015-07-15 MED FILL — LEVOTHYROXINE 75 MCG TABLET: 75 | 90 days supply | Qty: 100 | Fill #1

## 2015-08-11 ENCOUNTER — Ambulatory Visit (INDEPENDENT_AMBULATORY_CARE_PROVIDER_SITE_OTHER): Payer: 59

## 2015-08-11 ENCOUNTER — Ambulatory Visit (INDEPENDENT_AMBULATORY_CARE_PROVIDER_SITE_OTHER): Payer: 59 | Admitting: Physician Assistant

## 2015-08-11 ENCOUNTER — Ambulatory Visit (HOSPITAL_COMMUNITY)
Admission: RE | Admit: 2015-08-11 | Discharge: 2015-08-11 | Disposition: A | Discharge status: Home / Self Care | Payer: 59 | Source: Ambulatory Visit | Attending: Physician Assistant | Admitting: Physician Assistant

## 2015-08-11 VITALS — BP 118/76 | HR 67 | Temp 98.0°F | Resp 16 | Ht 66.0 in | Wt 181.0 lb

## 2015-08-11 DIAGNOSIS — M5442 Lumbago with sciatica, left side: Secondary | ICD-10-CM | POA: Diagnosis not present

## 2015-08-11 DIAGNOSIS — W108XXA Fall (on) (from) other stairs and steps, initial encounter: Secondary | ICD-10-CM | POA: Insufficient documentation

## 2015-08-11 DIAGNOSIS — R413 Other amnesia: Secondary | ICD-10-CM | POA: Insufficient documentation

## 2015-08-11 DIAGNOSIS — Z23 Encounter for immunization: Secondary | ICD-10-CM | POA: Diagnosis not present

## 2015-08-11 DIAGNOSIS — M47816 Spondylosis without myelopathy or radiculopathy, lumbar region: Secondary | ICD-10-CM | POA: Diagnosis not present

## 2015-08-11 DIAGNOSIS — S0990XA Unspecified injury of head, initial encounter: Secondary | ICD-10-CM | POA: Diagnosis not present

## 2015-08-11 LAB — POCT CBC
Granulocyte percent: 63.5 %G (ref 37–80)
HCT, POC: 38.9 % (ref 37.7–47.9)
HEMOGLOBIN: 13.5 g/dL (ref 12.2–16.2)
LYMPH, POC: 2.5 (ref 0.6–3.4)
MCH, POC: 30.1 pg (ref 27–31.2)
MCHC: 34.7 g/dL (ref 31.8–35.4)
MCV: 86.7 fL (ref 80–97)
MID (CBC): 0.2 (ref 0–0.9)
MPV: 9.2 fL (ref 0–99.8)
POC Granulocyte: 4.6 (ref 2–6.9)
POC LYMPH PERCENT: 34.2 %L (ref 10–50)
POC MID %: 2.3 %M (ref 0–12)
Platelet Count, POC: 179 10*3/uL (ref 142–424)
RBC: 4.48 M/uL (ref 4.04–5.48)
RDW, POC: 13.8 %
WBC: 7.3 10*3/uL (ref 4.6–10.2)

## 2015-08-11 LAB — TSH: TSH: 2.36 mIU/L

## 2015-08-11 LAB — COMPLETE METABOLIC PANEL WITH GFR
ALBUMIN: 3.8 g/dL (ref 3.6–5.1)
ALT: 10 U/L (ref 6–29)
AST: 13 U/L (ref 10–35)
Alkaline Phosphatase: 82 U/L (ref 33–115)
BUN: 14 mg/dL (ref 7–25)
CALCIUM: 9.2 mg/dL (ref 8.6–10.2)
CHLORIDE: 107 mmol/L (ref 98–110)
CO2: 28 mmol/L (ref 20–31)
CREATININE: 0.82 mg/dL (ref 0.50–1.10)
GFR, Est African American: >89 mL/min (ref >=60)
GFR, Est Non African American: 85 mL/min (ref >=60)
GLUCOSE: 83 mg/dL (ref 65–99)
Potassium: 4.5 mmol/L (ref 3.5–5.3)
Sodium: 142 mmol/L (ref 135–146)
Total Bilirubin: 0.8 mg/dL (ref 0.2–1.2)
Total Protein: 6.6 g/dL (ref 6.1–8.1)

## 2015-08-11 LAB — POCT URINALYSIS DIP (MANUAL ENTRY)
Bilirubin, UA: NEGATIVE
Glucose, UA: NEGATIVE
Ketones, POC UA: NEGATIVE
Leukocytes, UA: NEGATIVE
Nitrite, UA: NEGATIVE
SPEC GRAV UA: 1.025
Urobilinogen, UA: 0.2
pH, UA: 5.5

## 2015-08-11 MED ORDER — CYCLOBENZAPRINE HCL 10 MG PO TABS: 5.0000 mg | ORAL_TABLET | Freq: Three times a day (TID) | ORAL | Status: DC | PRN

## 2015-08-11 MED ORDER — HYDROCODONE-ACETAMINOPHEN 5-325 MG PO TABS: 0.5000 | ORAL_TABLET | Freq: Every day | ORAL | Status: DC

## 2015-08-11 MED FILL — HYDROCODON-APAP 5-325: 5-325 | 12 days supply | Qty: 12 | Fill #0

## 2015-08-11 MED FILL — CYCLOBENZAPRINE 10 MG TAB: 10 | 10 days supply | Qty: 30 | Fill #0

## 2015-08-11 NOTE — Progress Notes (Signed)
08/11/2015 7:44 PM   DOB: 1965-10-12 / MRN: [id number]  SUBJECTIVE:  Brenda Frost is a 50 y.o. female presenting for a fall that occurred yesterday in which she fell down some stairs.  She denies any memory of the event.  This was a witnessed fall and the witness can not recall the events surrounding the fall either.  The patient denies HA, vision changes, weakness, fogginess.  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    She complains of lower back pain that is achy and moderate in nature.  Associates numbness going down the left leg.  Denies weakness.  Hurts to walk.  She has taken Ibuprofen with good relief.  She did have a negative stress test and troponins in 2015.  There is no echo on file. The discharge summary shows this was likely radicular abdominal pain.       Immunization History  Administered Date(s) Administered  . Tdap 08/11/2015    She has No Known Allergies.   She  has a past medical history of Hypothyroidism and Hyperlipidemia.    She  reports that she has never smoked. She has never used smokeless tobacco. She reports that she drinks about 1.2 oz of alcohol per week. She reports that she does not use illicit drugs. She  has no sexual activity history on file. The patient  has past surgical history that includes Cesarean section.  Her family history includes Diabetes in her mother; Heart disease in her maternal grandmother; Hyperlipidemia in her mother; Hypertension in her mother. There is no history of CAD.  Review of Systems  Constitutional: Negative for fever and chills.  Gastrointestinal: Negative for nausea.  Skin: Negative for itching and rash.  Neurological: Positive for loss of consciousness. Negative for dizziness, tremors, speech change, focal weakness, seizures and headaches.    Problem list and medications reviewed and updated by myself where necessary, and exist elsewhere in the encounter.   OBJECTIVE:  BP 118/76 mmHg  Pulse 67  Temp(Src) 98  F (36.7 C) (Oral)  Resp 16  Ht 5\' 6"  (1.676 m)  Wt 181 lb (82.101 kg)  BMI 29.23 kg/m2  SpO2 100%  LMP 08/07/2015 (Exact Date)  Physical Exam  Constitutional: She is oriented to person, place, and time. She appears well-nourished. No distress.  Eyes: EOM are normal. Pupils are equal, round, and reactive to light.  Cardiovascular: Normal rate and regular rhythm.   Pulmonary/Chest: Effort normal and breath sounds normal.  Abdominal: She exhibits no distension.  Musculoskeletal: Normal range of motion.  Neurological: She is alert and oriented to person, place, and time. She has normal strength and normal reflexes. No cranial nerve deficit or sensory deficit. She displays a negative Romberg sign. Gait normal. GCS eye subscore is 4. GCS verbal subscore is 5. GCS motor subscore is 6.  Negative FTN, RAM, HTS.    Skin: Skin is warm and dry. She is not diaphoretic.  Psychiatric: She has a normal mood and affect.  Vitals reviewed.   Results for orders placed or performed in visit on 08/11/15 (from the past 72 hour(s))  POCT CBC     Status: None   Collection Time: 08/11/15  3:40 PM  Result Value Ref Range   WBC 7.3 4.6 - 10.2 K/uL   Lymph, poc 2.5 0.6 - 3.4   POC LYMPH PERCENT 34.2 10 - 50 %L   MID (cbc) 0.2 0 - 0.9   POC MID % 2.3 0 - 12 %M  POC Granulocyte 4.6 2 - 6.9   Granulocyte percent 63.5 37 - 80 %G   RBC 4.48 4.04 - 5.48 M/uL   Hemoglobin 13.5 12.2 - 16.2 g/dL   HCT, POC 38.9 37.7 - 47.9 %   MCV 86.7 80 - 97 fL   MCH, POC 30.1 27 - 31.2 pg   MCHC 34.7 31.8 - 35.4 g/dL   RDW, POC 13.8 %   Platelet Count, POC 179 142 - 424 K/uL   MPV 9.2 0 - 99.8 fL  POCT urinalysis dipstick     Status: Abnormal   Collection Time: 08/11/15  3:40 PM  Result Value Ref Range   Color, UA yellow yellow   Clarity, UA clear clear   Glucose, UA negative negative   Bilirubin, UA negative negative   Ketones, POC UA negative negative   Spec Grav, UA 1.025    Blood, UA moderate (A) negative    pH, UA 5.5    Protein Ur, POC trace (A) negative   Urobilinogen, UA 0.2    Nitrite, UA Negative Negative   Leukocytes, UA Negative Negative    Dg Lumbar Spine Complete  08/11/2015  CLINICAL DATA:  Left-sided low back pain EXAM: LUMBAR SPINE - COMPLETE 4+ VIEW COMPARISON:  None. FINDINGS: Mild levoscoliosis with normal anterior-posterior alignment. Mild spondylosis at L3-4 and L4-5. No fracture. No significant facet arthropathy. IMPRESSION: Very mild degenerative change Electronically Signed   By: Skipper Cliche M.D.   On: 08/11/2015 15:21   Ct Head Wo Contrast  08/11/2015  CLINICAL DATA:  Fall.  Loss of memory.  Headache. EXAM: CT HEAD WITHOUT CONTRAST TECHNIQUE: Contiguous axial images were obtained from the base of the skull through the vertex without intravenous contrast. COMPARISON:  08/09/2009 FINDINGS: Sinuses/Soft tissues: No significant soft tissue swelling. Clear paranasal sinuses and mastoid air cells. No skull fracture. Intracranial: No mass lesion, hemorrhage, hydrocephalus, acute infarct, intra-axial, or extra-axial fluid collection. IMPRESSION: Normal head CT. These results will be called to the ordering clinician or representative by the Radiology Department at the imaging location. Electronically Signed   By: Abigail Miyamoto M.D.   On: 08/11/2015 18:04    ASSESSMENT AND PLAN  Brenda Frost was seen today for back pain, numbness, hip pain and immunizations.  Diagnoses and all orders for this visit:  Left-sided low back pain with left-sided sciatica: She does have some hematuria. Doubt infection as she has none of those symptoms and white count is negative. She has no history of this and it may be secondary to the fall.  Will recheck a urine in a week or so.   -     DG Lumbar Spine Complete; Future -     cyclobenzaprine (FLEXERIL) 10 MG tablet; Take 0.5-1 tablets (5-10 mg total) by mouth 3 (three) times daily as needed for muscle spasms (May cause drowsiness. Do no operate heavy machinery  while taking.). -     HYDROcodone-acetaminophen (NORCO) 5-325 MG tablet; Take 0.5-1 tablets by mouth at bedtime.  Fall (on) (from) other stairs and steps, initial encounter -     CT Head Wo Contrast; Future  Memory loss of unknown cause: Best number [phone].  Head CT negative.  I feel that she has been adequately screened given this result as well as negative cardiac work up as noted in HPI.  Will screen thyroid and CMET for completeness.  I have advised that she most likely has a concussion and should take a week off from work for brain rest.   -  CT Head Wo Contrast; Future -     POCT CBC -     TSH -     COMPLETE METABOLIC PANEL WITH GFR -     POCT urinalysis dipstick  Need for Tdap vaccination -     Tdap vaccine greater than or equal to 7yo IM      The patient was advised to call or return to clinic if she does not see an improvement in symptoms or to seek the care of the closest emergency department if she worsens with the above plan.   Philis Fendt, MHS, PA-C [facility] 08/11/2015 7:44 PM

## 2015-08-11 NOTE — Patient Instructions (Addendum)
You are to go over to [facility]. [address] [place], [place] [address].  [phone]     IF you received an x-ray today, you will receive an invoice from [facility]. Please contact [facility] at [phone] with questions or concerns regarding your invoice.   IF you received labwork today, you will receive an invoice from [employer]. Please contact [facility] at [phone] with questions or concerns regarding your invoice.   Our billing staff will not be able to assist you with questions regarding bills from these companies.  You will be contacted with the lab results as soon as they are available. The fastest way to get your results is to activate your My Chart account. Instructions are located on the last page of this paperwork. If you have not heard from [place] regarding the results in 2 weeks, please contact this office.

## 2015-08-29 DIAGNOSIS — M7071 Other bursitis of hip, right hip: Secondary | ICD-10-CM | POA: Diagnosis not present

## 2015-08-29 DIAGNOSIS — M9905 Segmental and somatic dysfunction of pelvic region: Secondary | ICD-10-CM | POA: Diagnosis not present

## 2015-08-29 DIAGNOSIS — M9903 Segmental and somatic dysfunction of lumbar region: Secondary | ICD-10-CM | POA: Diagnosis not present

## 2015-08-29 DIAGNOSIS — M791 Myalgia: Secondary | ICD-10-CM | POA: Diagnosis not present

## 2015-08-29 DIAGNOSIS — M9902 Segmental and somatic dysfunction of thoracic region: Secondary | ICD-10-CM | POA: Diagnosis not present

## 2015-08-29 DIAGNOSIS — M7072 Other bursitis of hip, left hip: Secondary | ICD-10-CM | POA: Diagnosis not present

## 2015-09-01 DIAGNOSIS — M791 Myalgia: Secondary | ICD-10-CM | POA: Diagnosis not present

## 2015-09-01 DIAGNOSIS — M7072 Other bursitis of hip, left hip: Secondary | ICD-10-CM | POA: Diagnosis not present

## 2015-09-01 DIAGNOSIS — M9902 Segmental and somatic dysfunction of thoracic region: Secondary | ICD-10-CM | POA: Diagnosis not present

## 2015-09-01 DIAGNOSIS — M9905 Segmental and somatic dysfunction of pelvic region: Secondary | ICD-10-CM | POA: Diagnosis not present

## 2015-09-01 DIAGNOSIS — M7071 Other bursitis of hip, right hip: Secondary | ICD-10-CM | POA: Diagnosis not present

## 2015-09-01 DIAGNOSIS — M9903 Segmental and somatic dysfunction of lumbar region: Secondary | ICD-10-CM | POA: Diagnosis not present

## 2015-09-10 ENCOUNTER — Telehealth: Payer: 59 | Admitting: Family

## 2015-09-10 DIAGNOSIS — J019 Acute sinusitis, unspecified: Secondary | ICD-10-CM

## 2015-09-10 MED ORDER — AMOXICILLIN-POT CLAVULANATE 875-125 MG PO TABS: 1.0000 | ORAL_TABLET | Freq: Two times a day (BID) | ORAL | Status: DC

## 2015-09-10 NOTE — Progress Notes (Signed)

## 2015-09-11 MED FILL — AMOX TR-K CLV 875-125 MG TA: 875-125 | 7 days supply | Qty: 14 | Fill #0

## 2015-11-21 MED FILL — LEVOTHYROXINE 75 MCG TABLET: 75 | 90 days supply | Qty: 100 | Fill #2

## 2016-02-13 ENCOUNTER — Encounter (HOSPITAL_BASED_OUTPATIENT_CLINIC_OR_DEPARTMENT_OTHER): Payer: Self-pay | Admitting: Emergency Medicine

## 2016-02-13 ENCOUNTER — Emergency Department (HOSPITAL_BASED_OUTPATIENT_CLINIC_OR_DEPARTMENT_OTHER): Payer: 59

## 2016-02-13 ENCOUNTER — Emergency Department (HOSPITAL_BASED_OUTPATIENT_CLINIC_OR_DEPARTMENT_OTHER)
Admission: EM | Admit: 2016-02-13 | Discharge: 2016-02-13 | Disposition: A | Discharge status: Home / Self Care | Payer: 59 | Attending: Emergency Medicine | Admitting: Emergency Medicine

## 2016-02-13 DIAGNOSIS — W1839XA Other fall on same level, initial encounter: Secondary | ICD-10-CM | POA: Insufficient documentation

## 2016-02-13 DIAGNOSIS — Y999 Unspecified external cause status: Secondary | ICD-10-CM | POA: Diagnosis not present

## 2016-02-13 DIAGNOSIS — S52124A Nondisplaced fracture of head of right radius, initial encounter for closed fracture: Secondary | ICD-10-CM | POA: Insufficient documentation

## 2016-02-13 DIAGNOSIS — S52181A Other fracture of upper end of right radius, initial encounter for closed fracture: Secondary | ICD-10-CM | POA: Diagnosis not present

## 2016-02-13 DIAGNOSIS — W19XXXA Unspecified fall, initial encounter: Secondary | ICD-10-CM

## 2016-02-13 DIAGNOSIS — Y9389 Activity, other specified: Secondary | ICD-10-CM | POA: Insufficient documentation

## 2016-02-13 DIAGNOSIS — M25511 Pain in right shoulder: Secondary | ICD-10-CM | POA: Diagnosis not present

## 2016-02-13 DIAGNOSIS — S4991XA Unspecified injury of right shoulder and upper arm, initial encounter: Secondary | ICD-10-CM | POA: Diagnosis not present

## 2016-02-13 DIAGNOSIS — S52121A Displaced fracture of head of right radius, initial encounter for closed fracture: Secondary | ICD-10-CM | POA: Diagnosis not present

## 2016-02-13 DIAGNOSIS — E039 Hypothyroidism, unspecified: Secondary | ICD-10-CM | POA: Insufficient documentation

## 2016-02-13 DIAGNOSIS — M79601 Pain in right arm: Secondary | ICD-10-CM | POA: Diagnosis not present

## 2016-02-13 DIAGNOSIS — S6991XA Unspecified injury of right wrist, hand and finger(s), initial encounter: Secondary | ICD-10-CM | POA: Diagnosis not present

## 2016-02-13 DIAGNOSIS — Z79899 Other long term (current) drug therapy: Secondary | ICD-10-CM | POA: Diagnosis not present

## 2016-02-13 DIAGNOSIS — S52571A Other intraarticular fracture of lower end of right radius, initial encounter for closed fracture: Secondary | ICD-10-CM | POA: Diagnosis not present

## 2016-02-13 DIAGNOSIS — M25531 Pain in right wrist: Secondary | ICD-10-CM | POA: Diagnosis not present

## 2016-02-13 DIAGNOSIS — Y929 Unspecified place or not applicable: Secondary | ICD-10-CM | POA: Insufficient documentation

## 2016-02-13 MED ORDER — ONDANSETRON HCL 4 MG/2ML IJ SOLN
4.0000 mg | Freq: Once | INTRAMUSCULAR | Status: AC
  Administered 2016-02-13: 4 mg via INTRAVENOUS
  Filled 2016-02-13: qty 2

## 2016-02-13 MED ORDER — MORPHINE SULFATE (PF) 4 MG/ML IV SOLN
4.0000 mg | Freq: Once | INTRAVENOUS | Status: AC
  Administered 2016-02-13: 2 mg via INTRAVENOUS
  Filled 2016-02-13: qty 1

## 2016-02-13 MED ORDER — OXYCODONE-ACETAMINOPHEN 5-325 MG PO TABS: 2.0000 | ORAL_TABLET | ORAL | 0 refills | Status: DC | PRN

## 2016-02-13 MED ORDER — MORPHINE SULFATE (PF) 4 MG/ML IV SOLN
4.0000 mg | Freq: Once | INTRAVENOUS | Status: AC
  Administered 2016-02-13: 4 mg via INTRAVENOUS
  Filled 2016-02-13: qty 1

## 2016-02-13 NOTE — ED Notes (Signed)
Report to Sam, RN  

## 2016-02-13 NOTE — Discharge Instructions (Signed)
If you do not hear from Dr. Caralyn Guile tomorrow or Sunday please call the number above. Take the Percocet as needed for pain. Take a stool softener and MiraLAx to avoid constipation. Do not drive or operate machinery while taking Percocet. Return to emergency department if you experience worsening symptoms or new concerning symptoms such as numbness/timing weakness or numbness in your hand.

## 2016-02-13 NOTE — ED Triage Notes (Signed)
Pt fell through floor and caught herself on ceiling joists.  Pt having severe pain to right shoulder and arm.  Very painful to move arm/fingers.  Good cap refill.

## 2016-02-13 NOTE — ED Notes (Signed)
ED Provider at bedside. 

## 2016-02-13 NOTE — ED Provider Notes (Signed)
[facility] Provider Note   CSN: [id number] Arrival date & time: 02/13/16  1810  By signing my name below, I, Brenda Frost, attest that this documentation has been prepared under the direction and in the presence of Jackson Latino, PA-C. Electronically Signed: Evelene Frost, Scribe. 02/13/2016. 7:15 PM. History   Chief Complaint Chief Complaint  Patient presents with  . Fall   The history is provided by the patient. No language interpreter was used.    HPI Comments:  Brenda Frost is a 50 y.o. female who presents to the Emergency Department s/p fall today complaining of 9/10 pain to the RUE following the incident. She reports pain specifically to the right shoulder and hand. Pt states she was in the attic when she fell through the floor and landed on the ceiling joists on her right arm. No head injury or LOC. No rib pain, CP, SOB, dizziness, numbness in the extremity, vision changes, or abdominal pain. No alleviating factors noted.   Past Medical History:  Diagnosis Date  . Hyperlipidemia   . Hypothyroidism     Patient Active Problem List   Diagnosis Date Noted  . Chest pain 08/09/2013  . Hypothyroidism 08/09/2013  . Abdominal pain, epigastric 08/09/2013  . Nonspecific (abnormal) findings on radiological and other examination of biliary tract 08/09/2013  . Hyperlipemia 07/27/2010  . STRESS FRACTURE OF THE METATARSALS 06/04/2010  . FOOT PAIN, LEFT 04/09/2010  . PLANTAR FASCIITIS 01/28/2010    Past Surgical History:  Procedure Laterality Date  . CESAREAN SECTION      OB History    No data available       Home Medications    Prior to Admission medications   Medication Sig Start Date End Date Taking? Authorizing Provider  amoxicillin-clavulanate (AUGMENTIN) 875-125 MG tablet Take 1 tablet by mouth 2 (two) times daily. 09/10/15   Sharion Balloon, FNP  levothyroxine (SYNTHROID, LEVOTHROID) 75 MCG tablet Take 75 mcg by mouth daily. Reported on 08/11/2015     Historical Provider, MD  oxyCODONE-acetaminophen (PERCOCET/ROXICET) 5-325 MG tablet Take 2 tablets by mouth every 4 (four) hours as needed for severe pain. 02/13/16   Kalman Drape, PA    Family History Family History  Problem Relation Age of Onset  . Diabetes Mother   . Hyperlipidemia Mother   . Hypertension Mother   . Heart disease Maternal Grandmother   . CAD Neg Hx     Social History Social History  Substance Use Topics  . Smoking status: Never Smoker  . Smokeless tobacco: Never Used  . Alcohol use 1.2 oz/week    2 Cans of beer per week     Allergies   Patient has no known allergies.   Review of Systems Review of Systems  Eyes: Negative for visual disturbance.  Cardiovascular: Negative for chest pain.  Gastrointestinal: Negative for abdominal pain.  Musculoskeletal: Positive for arthralgias and myalgias.  Neurological: Negative for dizziness, syncope, weakness and numbness.  All other systems reviewed and are negative.  Physical Exam Updated Vital Signs BP 123/75 (BP Location: Left Arm)   Pulse 74   Temp 98 F (36.7 C) (Oral)   Resp 16   SpO2 98%   Physical Exam  Constitutional: She appears well-developed and well-nourished. No distress.  HENT:  Head: Normocephalic and atraumatic.  Eyes: Conjunctivae are normal.  Neck: Normal range of motion.  Cardiovascular:  Pulses:      Radial pulses are 2+ on the right side, and 2+ on the left  side.  Pulmonary/Chest: Effort normal and breath sounds normal. No respiratory distress.  No chest wall or rib tenderness, no ecchymosis or deformity noted to ribs, chest wall, or back. No spinal tenderness.  Musculoskeletal:  Examination of RUE revealed no obvious deformities, mild edema noted to the elbow, no erythema, ecchymosis noted to elbow, shoulder, wrist. Limited range of motion due to pain. TTP to medial and lateral epicondyles of elbow. No TTP to shoulder or wrist. Sensation intact. 2+ radial pulses bilaterally.  Brisk capillary refill. Patient neurovascular intact distally.  Neurological: She is alert. Coordination normal.  Skin: Skin is warm and dry. She is not diaphoretic.  Psychiatric: She has a normal mood and affect. Her behavior is normal.  Nursing note and vitals reviewed.  ED Treatments / Results  DIAGNOSTIC STUDIES:  Oxygen Saturation is 98% on RA, normal by my interpretation.    COORDINATION OF CARE:  7:11 PM Discussed treatment plan with pt at bedside and pt agreed to plan.  Labs (all labs ordered are listed, but only abnormal results are displayed) Labs Reviewed - No data to display  EKG  EKG Interpretation None       Radiology Dg Shoulder Right  Result Date: 02/13/2016 CLINICAL DATA:  Fall through ceiling, entire right arm pain and unable to move EXAM: RIGHT SHOULDER - 2+ VIEW COMPARISON:  None. FINDINGS: No acute fracture or dislocation. Right clavicle appears intact. AC joint does not appear widened. Right lung apex clear. Possible small about of calcific tendinitis. IMPRESSION: No acute osseous abnormality. Electronically Signed   By: Donavan Foil M.D.   On: 02/13/2016 19:46   Dg Elbow Complete Right  Result Date: 02/13/2016 CLINICAL DATA:  Fall through ceiling, entire right arm pain. Pt unable to move arm, very limited ROM due to pain. EXAM: RIGHT ELBOW - COMPLETE 3+ VIEW COMPARISON:  None. FINDINGS: There is a displaced fracture at the base of the radial head with associated lateral angulation deformity. Suspect fracture involvement of the articular surface but not clearly delineated. Distal humerus and proximal ulna appear intact and normally aligned. No convincing evidence of a joint effusion at the right elbow, although slightly oblique patient positioning limits characterization IMPRESSION: 1. Displaced fracture at the base of the right radial head with associated angulation deformity. Suspect fracture extension to the articular surface but not clearly delineated. 2.  No other osseous fracture or dislocation seen. Electronically Signed   By: Franki Cabot M.D.   On: 02/13/2016 19:49   Dg Wrist Complete Right  Result Date: 02/13/2016 CLINICAL DATA:  Fall, right arm pain, limited range of motion. EXAM: RIGHT WRIST - COMPLETE 3+ VIEW COMPARISON:  None. FINDINGS: Four views of the right wrist are provided. Osseous alignment is normal. Bone mineralization is normal. No fracture line or displaced fracture fragment identified. Adjacent soft tissues are unremarkable. IMPRESSION: Negative. Electronically Signed   By: Franki Cabot M.D.   On: 02/13/2016 19:50   Ct Elbow Right Wo Contrast  Result Date: 02/13/2016 CLINICAL DATA:  Fall with elbow fracture EXAM: CT OF THE RIGHT ELBOW WITHOUT CONTRAST TECHNIQUE: Multidetector CT imaging was performed according to the standard protocol. Multiplanar CT image reconstructions were also generated. COMPARISON:  Radiographs 02/13/2016 FINDINGS: There is a comminuted and impacted intra-articular fracture involving the radial head and neck. Approximately 4 mm of depression of the lateral aspect of the fractured radial head. Radial capitellar alignment is maintained. There is nondisplaced linear cortical fracture extending down the proximal shaft of the radius laterally. There  is no evidence for distal humerus fracture. The imaged ulna appears intact. There is fat pad distention consistent with elbow effusion. IMPRESSION: Comminuted, impacted intra-articular fracture involving the radial head and neck with slight depression of the lateral radial head fracture fragment. No dislocation evident. Elbow effusion is present. Electronically Signed   By: Donavan Foil M.D.   On: 02/13/2016 22:40   Dg Humerus Right  Result Date: 02/13/2016 CLINICAL DATA:  Fall through ceiling, entire right arm pain. Pt unable to move arm, very limited ROM due to pain. EXAM: RIGHT HUMERUS - 2+ VIEW COMPARISON:  None. FINDINGS: There is no evidence of fracture or  other focal bone lesions. Soft tissues are unremarkable. IMPRESSION: Negative. Electronically Signed   By: Franki Cabot M.D.   On: 02/13/2016 19:45    Procedures Procedures (including critical care time)  Medications Ordered in ED Medications  morphine 4 MG/ML injection 4 mg (2 mg Intravenous Given 02/13/16 1858)  ondansetron (ZOFRAN) injection 4 mg (4 mg Intravenous Given 02/13/16 1855)  morphine 4 MG/ML injection 4 mg (4 mg Intravenous Given 02/13/16 1950)  morphine 4 MG/ML injection 4 mg (4 mg Intravenous Given 02/13/16 2159)     Initial Impression / Assessment and Plan / ED Course  I have reviewed the triage vital signs and the nursing notes.  Pertinent labs & imaging results that were available during my care of the patient were reviewed by me and considered in my medical decision making (see chart for details).  Clinical Course     Patient with right arm pain after fall. X-rays reveal radial head fracture. Spoke with Dr. Apolonio Schneiders, hand surgeon, who requested CT scan of right elbow, place patient in long-arm splint, and he states he will call her tomorrow to discuss potential surgery. Patient placed in arm sling. Discussed RICE and pain medication. Discussed return precautions. I discussed all of the results with the patient and family members they have expressed their understanding to the verbal discharge instructions.  Patient case discussed with Dr. Rex Kras who agrees with the above plan.  Final Clinical Impressions(s) / ED Diagnoses   Final diagnoses:  Fall, initial encounter  Closed nondisplaced fracture of head of right radius, initial encounter    New Prescriptions New Prescriptions   OXYCODONE-ACETAMINOPHEN (PERCOCET/ROXICET) 5-325 MG TABLET    Take 2 tablets by mouth every 4 (four) hours as needed for severe pain.   I personally performed the services described in this documentation, which was scribed in my presence. The recorded information has been reviewed and is  accurate.       Kalman Drape, PA 02/14/16 [place], MD 02/15/16 [phone]

## 2016-02-13 NOTE — ED Notes (Signed)
Patient transported to X-ray 

## 2016-02-14 ENCOUNTER — Ambulatory Visit (INDEPENDENT_AMBULATORY_CARE_PROVIDER_SITE_OTHER): Payer: 59 | Admitting: Physician Assistant

## 2016-02-14 VITALS — BP 118/80 | HR 76 | Temp 98.0°F | Resp 17 | Ht 66.0 in | Wt 182.0 lb

## 2016-02-14 DIAGNOSIS — W133XXA Fall through floor, initial encounter: Secondary | ICD-10-CM | POA: Diagnosis not present

## 2016-02-14 DIAGNOSIS — R42 Dizziness and giddiness: Secondary | ICD-10-CM | POA: Diagnosis not present

## 2016-02-14 DIAGNOSIS — R112 Nausea with vomiting, unspecified: Secondary | ICD-10-CM

## 2016-02-14 DIAGNOSIS — S52121B Displaced fracture of head of right radius, initial encounter for open fracture type I or II: Secondary | ICD-10-CM | POA: Diagnosis not present

## 2016-02-14 DIAGNOSIS — E860 Dehydration: Secondary | ICD-10-CM

## 2016-02-14 LAB — POCT CBC
GRANULOCYTE PERCENT: 87.2 % — AB (ref 37–80)
HCT, POC: 41.8 % (ref 37.7–47.9)
HEMOGLOBIN: 14.7 g/dL (ref 12.2–16.2)
Lymph, poc: 1.2 (ref 0.6–3.4)
MCH: 30.6 pg (ref 27–31.2)
MCHC: 35.2 g/dL (ref 31.8–35.4)
MCV: 86.9 fL (ref 80–97)
MID (cbc): 0.3 (ref 0–0.9)
MPV: 9.4 fL (ref 0–99.8)
PLATELET COUNT, POC: 186 10*3/uL (ref 142–424)
POC Granulocyte: 10.7 — AB (ref 2–6.9)
POC LYMPH PERCENT: 10.1 %L (ref 10–50)
POC MID %: 2.7 %M (ref 0–12)
RBC: 4.82 M/uL (ref 4.04–5.48)
RDW, POC: 13.6 %
WBC: 12.3 10*3/uL — AB (ref 4.6–10.2)

## 2016-02-14 LAB — POCT URINALYSIS DIP (MANUAL ENTRY)
Bilirubin, UA: NEGATIVE
GLUCOSE UA: NEGATIVE
Leukocytes, UA: NEGATIVE
Nitrite, UA: NEGATIVE
SPEC GRAV UA: 1.025
UROBILINOGEN UA: 0.2
pH, UA: 6.5

## 2016-02-14 LAB — GLUCOSE, POCT (MANUAL RESULT ENTRY): POC Glucose: 106 mg/dl — AB (ref 70–99)

## 2016-02-14 MED ORDER — ONDANSETRON HCL 4 MG PO TABS: 4.0000 mg | ORAL_TABLET | Freq: Four times a day (QID) | ORAL | 0 refills | Status: DC

## 2016-02-14 MED ORDER — ONDANSETRON 4 MG PO TBDP
8.0000 mg | ORAL_TABLET | Freq: Once | ORAL | Status: AC
  Administered 2016-02-14: 8 mg via ORAL

## 2016-02-14 NOTE — H&P (Signed)
Brenda Frost is an 50 y.o. female.   Chief Complaint: Right proximal radius fracture HPI: Brenda Frost is a 50 y.o. female who presents to the Emergency Department s/p fall today complaining of 9/10 pain to the RUE following the incident. She reports pain specifically to the right shoulder and hand. Pt states she was in the attic when she fell through the floor and landed on the ceiling joists on her right arm. No head injury or LOC. No rib pain, CP, SOB, dizziness, numbness in the extremity, vision changes, or abdominal pain. No alleviating factors noted.   Past Medical History:  Diagnosis Date  . Hyperlipidemia   . Hypothyroidism     Past Surgical History:  Procedure Laterality Date  . CESAREAN SECTION      Family History  Problem Relation Age of Onset  . Diabetes Mother   . Hyperlipidemia Mother   . Hypertension Mother   . Heart disease Maternal Grandmother   . CAD Neg Hx    Social History:  reports that she has never smoked. She has never used smokeless tobacco. She reports that she drinks about 1.2 oz of alcohol per week . She reports that she does not use drugs.  Allergies: No Known Allergies  No prescriptions prior to admission.    Results for orders placed or performed in visit on 02/14/16 (from the past 48 hour(s))  POCT glucose (manual entry)     Status: Abnormal   Collection Time: 02/14/16 10:31 AM  Result Value Ref Range   POC Glucose 106 (A) 70 - 99 mg/dl  POCT CBC     Status: Abnormal   Collection Time: 02/14/16 10:31 AM  Result Value Ref Range   WBC 12.3 (A) 4.6 - 10.2 K/uL   Lymph, poc 1.2 0.6 - 3.4   POC LYMPH PERCENT 10.1 10 - 50 %L   MID (cbc) 0.3 0 - 0.9   POC MID % 2.7 0 - 12 %M   POC Granulocyte 10.7 (A) 2 - 6.9   Granulocyte percent 87.2 (A) 37 - 80 %G   RBC 4.82 4.04 - 5.48 M/uL   Hemoglobin 14.7 12.2 - 16.2 g/dL   HCT, POC 41.8 37.7 - 47.9 %   MCV 86.9 80 - 97 fL   MCH, POC 30.6 27 - 31.2 pg   MCHC 35.2 31.8 - 35.4 g/dL   RDW, POC 13.6  %   Platelet Count, POC 186 142 - 424 K/uL   MPV 9.4 0 - 99.8 fL  POCT urinalysis dipstick     Status: Abnormal   Collection Time: 02/14/16 11:11 AM  Result Value Ref Range   Color, UA yellow yellow   Clarity, UA clear clear   Glucose, UA negative negative   Bilirubin, UA negative negative   Ketones, POC UA small (15) (A) negative   Spec Grav, UA 1.025    Blood, UA trace-intact (A) negative   pH, UA 6.5    Protein Ur, POC trace (A) negative   Urobilinogen, UA 0.2    Nitrite, UA Negative Negative   Leukocytes, UA Negative Negative   Dg Shoulder Right  Result Date: 02/13/2016 CLINICAL DATA:  Fall through ceiling, entire right arm pain and unable to move EXAM: RIGHT SHOULDER - 2+ VIEW COMPARISON:  None. FINDINGS: No acute fracture or dislocation. Right clavicle appears intact. AC joint does not appear widened. Right lung apex clear. Possible small about of calcific tendinitis. IMPRESSION: No acute osseous abnormality. Electronically Signed  By: Donavan Foil M.D.   On: 02/13/2016 19:46   Dg Elbow Complete Right  Result Date: 02/13/2016 CLINICAL DATA:  Fall through ceiling, entire right arm pain. Pt unable to move arm, very limited ROM due to pain. EXAM: RIGHT ELBOW - COMPLETE 3+ VIEW COMPARISON:  None. FINDINGS: There is a displaced fracture at the base of the radial head with associated lateral angulation deformity. Suspect fracture involvement of the articular surface but not clearly delineated. Distal humerus and proximal ulna appear intact and normally aligned. No convincing evidence of a joint effusion at the right elbow, although slightly oblique patient positioning limits characterization IMPRESSION: 1. Displaced fracture at the base of the right radial head with associated angulation deformity. Suspect fracture extension to the articular surface but not clearly delineated. 2. No other osseous fracture or dislocation seen. Electronically Signed   By: Franki Cabot M.D.   On:  02/13/2016 19:49   Dg Wrist Complete Right  Result Date: 02/13/2016 CLINICAL DATA:  Fall, right arm pain, limited range of motion. EXAM: RIGHT WRIST - COMPLETE 3+ VIEW COMPARISON:  None. FINDINGS: Four views of the right wrist are provided. Osseous alignment is normal. Bone mineralization is normal. No fracture line or displaced fracture fragment identified. Adjacent soft tissues are unremarkable. IMPRESSION: Negative. Electronically Signed   By: Franki Cabot M.D.   On: 02/13/2016 19:50   Ct Elbow Right Wo Contrast  Result Date: 02/13/2016 CLINICAL DATA:  Fall with elbow fracture EXAM: CT OF THE RIGHT ELBOW WITHOUT CONTRAST TECHNIQUE: Multidetector CT imaging was performed according to the standard protocol. Multiplanar CT image reconstructions were also generated. COMPARISON:  Radiographs 02/13/2016 FINDINGS: There is a comminuted and impacted intra-articular fracture involving the radial head and neck. Approximately 4 mm of depression of the lateral aspect of the fractured radial head. Radial capitellar alignment is maintained. There is nondisplaced linear cortical fracture extending down the proximal shaft of the radius laterally. There is no evidence for distal humerus fracture. The imaged ulna appears intact. There is fat pad distention consistent with elbow effusion. IMPRESSION: Comminuted, impacted intra-articular fracture involving the radial head and neck with slight depression of the lateral radial head fracture fragment. No dislocation evident. Elbow effusion is present. Electronically Signed   By: Donavan Foil M.D.   On: 02/13/2016 22:40   Dg Humerus Right  Result Date: 02/13/2016 CLINICAL DATA:  Fall through ceiling, entire right arm pain. Pt unable to move arm, very limited ROM due to pain. EXAM: RIGHT HUMERUS - 2+ VIEW COMPARISON:  None. FINDINGS: There is no evidence of fracture or other focal bone lesions. Soft tissues are unremarkable. IMPRESSION: Negative. Electronically Signed    By: Franki Cabot M.D.   On: 02/13/2016 19:45    ROS: NO RECENT ILLNESSES OR HOSPITALIZATIONS  Last menstrual period 01/23/2016. Physical Exam  General Appearance:  Alert, cooperative, no distress, appears stated age  Head:  Normocephalic, without obvious abnormality, atraumatic  Eyes:  Pupils equal, conjunctiva/corneas clear,         Throat: Lips, mucosa, and tongue normal; teeth and gums normal  Neck: No visible masses     Lungs:   respirations unlabored  Chest Wall:  No tenderness or deformity  Heart:  Regular rate and rhythm,  Abdomen:   Soft, non-tender,         Extremities: RUE: SPLINT IN PLACE FINGERS WARM WELL PERFUSED ABLE TO EXTEND THUMB AND FINGERS   Pulses: 2+ and symmetric  Skin: Skin color, texture, turgor normal, no  rashes or lesions     Neurologic: Normal     Assessment/Plan RIGHT PROXIMAL RADIUS FRACTURE, DISPLACED  RIGHT PROXIMAL RADIUS OPEN REDUCTION AND INTERNAL FIXATION POSSIBLE ARTHROPLASTY  R/B/A DISCUSSED WITH PT IN OFFICE.  PT VOICED UNDERSTANDING OF PLAN CONSENT SIGNED DAY OF SURGERY PT SEEN AND EXAMINED PRIOR TO OPERATIVE PROCEDURE/DAY OF SURGERY SITE MARKED. QUESTIONS ANSWERED WILL REMAIN OBSERVATION FOLLOWING SURGERY  WE ARE PLANNING SURGERY FOR YOUR UPPER EXTREMITY. THE RISKS AND BENEFITS OF SURGERY INCLUDE BUT NOT LIMITED TO BLEEDING INFECTION, DAMAGE TO NEARBY NERVES ARTERIES TENDONS, FAILURE OF SURGERY TO ACCOMPLISH ITS INTENDED GOALS, PERSISTENT SYMPTOMS AND NEED FOR FURTHER SURGICAL INTERVENTION. WITH THIS IN MIND WE WILL PROCEED. I HAVE DISCUSSED WITH THE PATIENT THE PRE AND POSTOPERATIVE REGIMEN AND THE DOS AND DON'TS. PT VOICED UNDERSTANDING AND INFORMED CONSENT SIGNED.  Linna Hoff 02/15/2016 @ 0745

## 2016-02-14 NOTE — Progress Notes (Signed)
02/14/2016 12:02 PM   DOB: 06-Jun-1965 / MRN: [id number]  SUBJECTIVE:  Brenda Frost is a 50 y.o. female presenting for nausea, emesis, and room spinning type dizziness.  Reports that she fell through her rafters yesterday after moving some Christmas decorations and her legs were dangling through the upper sheet rock.  She did break her right proximal radius during the fall and she has gone to the ED for eval and her dizziness started after sitting down in her car after leaving the ED yesterday.  She did not injure her head yesterday.  She denies HA now as well as generalized weakness.  Moving and head turning makes the dizziness worse.   She has No Known Allergies.   She  has a past medical history of Hyperlipidemia and Hypothyroidism.    She  reports that she has never smoked. She has never used smokeless tobacco. She reports that she drinks about 1.2 oz of alcohol per week . She reports that she does not use drugs. She  has no sexual activity history on file. The patient  has a past surgical history that includes Cesarean section.  Her family history includes Diabetes in her mother; Heart disease in her maternal grandmother; Hyperlipidemia in her mother; Hypertension in her mother.  Review of Systems  Constitutional: Negative for chills and fever.  HENT: Negative for congestion, ear discharge, ear pain, hearing loss and tinnitus.   Respiratory: Negative for cough.   Gastrointestinal: Negative for nausea.  Musculoskeletal: Negative for myalgias.  Skin: Negative for rash.  Neurological: Positive for dizziness. Negative for tingling, tremors, sensory change, speech change, focal weakness, seizures, loss of consciousness and headaches.    The problem list and medications were reviewed and updated by myself where necessary and exist elsewhere in the encounter.   OBJECTIVE:  BP 118/80 (BP Location: Left Arm, Patient Position: Sitting, Cuff Size: Large)   Pulse 76   Temp 98 F (36.7 C)  (Oral)   Resp 17   Ht 5\' 6"  (1.676 m)   Wt 182 lb (82.6 kg)   LMP 01/23/2016   SpO2 94%   BMI 29.38 kg/m   Physical Exam  Constitutional: She is oriented to person, place, and time.  Eyes: Right eye exhibits nystagmus. Left eye exhibits nystagmus.  Cardiovascular: Normal rate and regular rhythm.   Pulmonary/Chest: Effort normal and breath sounds normal.  Musculoskeletal: Normal range of motion.  Neurological: She is alert and oriented to person, place, and time. She displays normal reflexes. No cranial nerve deficit. She exhibits normal muscle tone. Coordination normal.  Skin: Skin is warm and dry.  Psychiatric: She has a normal mood and affect.    Results for orders placed or performed in visit on 02/14/16 (from the past 72 hour(s))  POCT glucose (manual entry)     Status: Abnormal   Collection Time: 02/14/16 10:31 AM  Result Value Ref Range   POC Glucose 106 (A) 70 - 99 mg/dl  POCT CBC     Status: Abnormal   Collection Time: 02/14/16 10:31 AM  Result Value Ref Range   WBC 12.3 (A) 4.6 - 10.2 K/uL   Lymph, poc 1.2 0.6 - 3.4   POC LYMPH PERCENT 10.1 10 - 50 %L   MID (cbc) 0.3 0 - 0.9   POC MID % 2.7 0 - 12 %M   POC Granulocyte 10.7 (A) 2 - 6.9   Granulocyte percent 87.2 (A) 37 - 80 %G   RBC 4.82 4.04 - 5.48  M/uL   Hemoglobin 14.7 12.2 - 16.2 g/dL   HCT, POC 41.8 37.7 - 47.9 %   MCV 86.9 80 - 97 fL   MCH, POC 30.6 27 - 31.2 pg   MCHC 35.2 31.8 - 35.4 g/dL   RDW, POC 13.6 %   Platelet Count, POC 186 142 - 424 K/uL   MPV 9.4 0 - 99.8 fL  POCT urinalysis dipstick     Status: Abnormal   Collection Time: 02/14/16 11:11 AM  Result Value Ref Range   Color, UA yellow yellow   Clarity, UA clear clear   Glucose, UA negative negative   Bilirubin, UA negative negative   Ketones, POC UA small (15) (A) negative   Spec Grav, UA 1.025    Blood, UA trace-intact (A) negative   pH, UA 6.5    Protein Ur, POC trace (A) negative   Urobilinogen, UA 0.2    Nitrite, UA Negative  Negative   Leukocytes, UA Negative Negative    Dg Shoulder Right  Result Date: 02/13/2016 CLINICAL DATA:  Fall through ceiling, entire right arm pain and unable to move EXAM: RIGHT SHOULDER - 2+ VIEW COMPARISON:  None. FINDINGS: No acute fracture or dislocation. Right clavicle appears intact. AC joint does not appear widened. Right lung apex clear. Possible small about of calcific tendinitis. IMPRESSION: No acute osseous abnormality. Electronically Signed   By: Donavan Foil M.D.   On: 02/13/2016 19:46   Dg Elbow Complete Right  Result Date: 02/13/2016 CLINICAL DATA:  Fall through ceiling, entire right arm pain. Pt unable to move arm, very limited ROM due to pain. EXAM: RIGHT ELBOW - COMPLETE 3+ VIEW COMPARISON:  None. FINDINGS: There is a displaced fracture at the base of the radial head with associated lateral angulation deformity. Suspect fracture involvement of the articular surface but not clearly delineated. Distal humerus and proximal ulna appear intact and normally aligned. No convincing evidence of a joint effusion at the right elbow, although slightly oblique patient positioning limits characterization IMPRESSION: 1. Displaced fracture at the base of the right radial head with associated angulation deformity. Suspect fracture extension to the articular surface but not clearly delineated. 2. No other osseous fracture or dislocation seen. Electronically Signed   By: Franki Cabot M.D.   On: 02/13/2016 19:49   Dg Wrist Complete Right  Result Date: 02/13/2016 CLINICAL DATA:  Fall, right arm pain, limited range of motion. EXAM: RIGHT WRIST - COMPLETE 3+ VIEW COMPARISON:  None. FINDINGS: Four views of the right wrist are provided. Osseous alignment is normal. Bone mineralization is normal. No fracture line or displaced fracture fragment identified. Adjacent soft tissues are unremarkable. IMPRESSION: Negative. Electronically Signed   By: Franki Cabot M.D.   On: 02/13/2016 19:50   Ct Elbow  Right Wo Contrast  Result Date: 02/13/2016 CLINICAL DATA:  Fall with elbow fracture EXAM: CT OF THE RIGHT ELBOW WITHOUT CONTRAST TECHNIQUE: Multidetector CT imaging was performed according to the standard protocol. Multiplanar CT image reconstructions were also generated. COMPARISON:  Radiographs 02/13/2016 FINDINGS: There is a comminuted and impacted intra-articular fracture involving the radial head and neck. Approximately 4 mm of depression of the lateral aspect of the fractured radial head. Radial capitellar alignment is maintained. There is nondisplaced linear cortical fracture extending down the proximal shaft of the radius laterally. There is no evidence for distal humerus fracture. The imaged ulna appears intact. There is fat pad distention consistent with elbow effusion. IMPRESSION: Comminuted, impacted intra-articular fracture involving the radial head  and neck with slight depression of the lateral radial head fracture fragment. No dislocation evident. Elbow effusion is present. Electronically Signed   By: Donavan Foil M.D.   On: 02/13/2016 22:40   Dg Humerus Right  Result Date: 02/13/2016 CLINICAL DATA:  Fall through ceiling, entire right arm pain. Pt unable to move arm, very limited ROM due to pain. EXAM: RIGHT HUMERUS - 2+ VIEW COMPARISON:  None. FINDINGS: There is no evidence of fracture or other focal bone lesions. Soft tissues are unremarkable. IMPRESSION: Negative. Electronically Signed   By: Franki Cabot M.D.   On: 02/13/2016 19:45    ASSESSMENT AND PLAN  Brenda Frost was seen today for dizziness.  Diagnoses and all orders for this visit:  Dizziness: I had her daughter go and get some meclizine and she has taken this along with 8 of zofran and 1 liter of fluids.  She states "I feel ten times better."  She continue to deny HA.  -     POCT glucose (manual entry) -     POCT CBC -     POCT urinalysis dipstick -     ondansetron (ZOFRAN-ODT) disintegrating tablet 8 mg; Take 2 tablets (8  mg total) by mouth once.  Non-intractable vomiting with nausea, unspecified vomiting type -     ondansetron (ZOFRAN) 4 MG tablet; Take 1 tablet (4 mg total) by mouth every 6 (six) hours.  Dehydration: Per exam and urine.  Improved with 1 liter NS. Advised copious fluids.   The patient is advised to call or return to clinic if she does not see an improvement in symptoms, or to seek the care of the closest emergency department if she worsens with the above plan.   Philis Fendt, MHS, PA-C [facility] 02/14/2016 12:02 PM

## 2016-02-14 NOTE — Patient Instructions (Signed)
     IF you received an x-ray today, you will receive an invoice from [facility]. Please contact [facility] at [phone] with questions or concerns regarding your invoice.   IF you received labwork today, you will receive an invoice from [facility]. Please contact [facility] at [phone] with questions or concerns regarding your invoice.   Our billing staff will not be able to assist you with questions regarding bills from these companies.  You will be contacted with the lab results as soon as they are available. The fastest way to get your results is to activate your My Chart account. Instructions are located on the last page of this paperwork. If you have not heard from us regarding the results in 2 weeks, please contact this office.      

## 2016-02-15 ENCOUNTER — Inpatient Hospital Stay (HOSPITAL_COMMUNITY): Payer: 59 | Admitting: Certified Registered"

## 2016-02-15 ENCOUNTER — Encounter (HOSPITAL_COMMUNITY)
Admission: RE | Disposition: A | Discharge status: Home / Self Care | Payer: Self-pay | Source: Ambulatory Visit | Attending: Orthopedic Surgery

## 2016-02-15 ENCOUNTER — Ambulatory Visit (HOSPITAL_COMMUNITY)
Admission: RE | Admit: 2016-02-15 | Discharge: 2016-02-15 | Disposition: A | Discharge status: Home / Self Care | Payer: 59 | Source: Ambulatory Visit | Attending: Orthopedic Surgery | Admitting: Orthopedic Surgery

## 2016-02-15 DIAGNOSIS — G8918 Other acute postprocedural pain: Secondary | ICD-10-CM | POA: Diagnosis not present

## 2016-02-15 DIAGNOSIS — E785 Hyperlipidemia, unspecified: Secondary | ICD-10-CM | POA: Diagnosis not present

## 2016-02-15 DIAGNOSIS — E039 Hypothyroidism, unspecified: Secondary | ICD-10-CM | POA: Insufficient documentation

## 2016-02-15 DIAGNOSIS — S52121A Displaced fracture of head of right radius, initial encounter for closed fracture: Principal | ICD-10-CM | POA: Insufficient documentation

## 2016-02-15 DIAGNOSIS — W1789XA Other fall from one level to another, initial encounter: Secondary | ICD-10-CM | POA: Insufficient documentation

## 2016-02-15 DIAGNOSIS — S52101A Unspecified fracture of upper end of right radius, initial encounter for closed fracture: Secondary | ICD-10-CM | POA: Diagnosis present

## 2016-02-15 HISTORY — PX: ORIF ELBOW FRACTURE: SHX5031

## 2016-02-15 SURGERY — OPEN REDUCTION INTERNAL FIXATION (ORIF) ELBOW/OLECRANON FRACTURE
Anesthesia: Regional | Site: Arm Lower | Laterality: Right

## 2016-02-15 MED ORDER — LEVOTHYROXINE SODIUM 75 MCG PO TABS: 75.0000 ug | ORAL_TABLET | Freq: Every day | ORAL | Status: DC

## 2016-02-15 MED ORDER — METHOCARBAMOL 1000 MG/10ML IJ SOLN
500.0000 mg | Freq: Four times a day (QID) | INTRAMUSCULAR | Status: DC | PRN
  Filled 2016-02-15: qty 5

## 2016-02-15 MED ORDER — ADULT MULTIVITAMIN W/MINERALS CH
1.0000 | ORAL_TABLET | Freq: Every day | ORAL | Status: DC
  Administered 2016-02-15: 1 via ORAL
  Filled 2016-02-15: qty 1

## 2016-02-15 MED ORDER — CEFAZOLIN IN D5W 1 GM/50ML IV SOLN
1.0000 g | INTRAVENOUS | Status: AC
  Administered 2016-02-15: 1 g via INTRAVENOUS

## 2016-02-15 MED ORDER — HYDROMORPHONE HCL 2 MG/ML IJ SOLN: 0.5000 mg | INTRAMUSCULAR | Status: DC | PRN

## 2016-02-15 MED ORDER — FENTANYL CITRATE (PF) 100 MCG/2ML IJ SOLN
INTRAMUSCULAR | Status: AC
  Filled 2016-02-15: qty 4

## 2016-02-15 MED ORDER — BUPIVACAINE HCL (PF) 0.25 % IJ SOLN
INTRAMUSCULAR | Status: AC
  Filled 2016-02-15: qty 30

## 2016-02-15 MED ORDER — METHOCARBAMOL 500 MG PO TABS: 500.0000 mg | ORAL_TABLET | Freq: Four times a day (QID) | ORAL | 0 refills | Status: DC

## 2016-02-15 MED ORDER — LACTATED RINGERS IV SOLN
INTRAVENOUS | Status: DC | PRN
  Administered 2016-02-15 (×2): via INTRAVENOUS

## 2016-02-15 MED ORDER — ROPIVACAINE HCL 7.5 MG/ML IJ SOLN
INTRAMUSCULAR | Status: DC | PRN
  Administered 2016-02-15: 20 mL via PERINEURAL

## 2016-02-15 MED ORDER — PROPOFOL 10 MG/ML IV BOLUS
INTRAVENOUS | Status: DC | PRN
  Administered 2016-02-15: 200 mg via INTRAVENOUS

## 2016-02-15 MED ORDER — LIDOCAINE HCL (CARDIAC) 20 MG/ML IV SOLN
INTRAVENOUS | Status: DC | PRN
  Administered 2016-02-15: 60 mg via INTRATRACHEAL

## 2016-02-15 MED ORDER — METHOCARBAMOL 500 MG PO TABS: 500.0000 mg | ORAL_TABLET | Freq: Four times a day (QID) | ORAL | Status: DC | PRN

## 2016-02-15 MED ORDER — ONDANSETRON HCL 4 MG/2ML IJ SOLN
INTRAMUSCULAR | Status: AC
  Filled 2016-02-15: qty 10

## 2016-02-15 MED ORDER — BUPIVACAINE HCL (PF) 0.25 % IJ SOLN
INTRAMUSCULAR | Status: DC | PRN
  Administered 2016-02-15: 8 mL

## 2016-02-15 MED ORDER — ONDANSETRON HCL 4 MG PO TABS
4.0000 mg | ORAL_TABLET | Freq: Four times a day (QID) | ORAL | Status: DC
Start: 2016-02-15 — End: 2016-02-15
  Administered 2016-02-15: 4 mg via ORAL
  Filled 2016-02-15: qty 1

## 2016-02-15 MED ORDER — KCL IN DEXTROSE-NACL 20-5-0.45 MEQ/L-%-% IV SOLN
INTRAVENOUS | Status: DC
  Administered 2016-02-15: 12:00:00 via INTRAVENOUS
  Filled 2016-02-15: qty 1000

## 2016-02-15 MED ORDER — 0.9 % SODIUM CHLORIDE (POUR BTL) OPTIME
TOPICAL | Status: DC | PRN
  Administered 2016-02-15: 1000 mL

## 2016-02-15 MED ORDER — PROPOFOL 10 MG/ML IV BOLUS
INTRAVENOUS | Status: AC
  Filled 2016-02-15: qty 20

## 2016-02-15 MED ORDER — PROMETHAZINE HCL 25 MG PO TABS: 12.5000 mg | ORAL_TABLET | Freq: Four times a day (QID) | ORAL | Status: DC | PRN

## 2016-02-15 MED ORDER — DOCUSATE SODIUM 100 MG PO CAPS: 100.0000 mg | ORAL_CAPSULE | Freq: Two times a day (BID) | ORAL | 0 refills | Status: DC

## 2016-02-15 MED ORDER — LIDOCAINE 2% (20 MG/ML) 5 ML SYRINGE
INTRAMUSCULAR | Status: AC
  Filled 2016-02-15: qty 30

## 2016-02-15 MED ORDER — MIDAZOLAM HCL 2 MG/2ML IJ SOLN
INTRAMUSCULAR | Status: DC | PRN
  Administered 2016-02-15: 2 mg via INTRAVENOUS

## 2016-02-15 MED ORDER — MEPIVACAINE HCL 1.5 % IJ SOLN
INTRAMUSCULAR | Status: DC | PRN
  Administered 2016-02-15: 10 mL via PERINEURAL

## 2016-02-15 MED ORDER — CEFAZOLIN IN D5W 1 GM/50ML IV SOLN
INTRAVENOUS | Status: AC
  Filled 2016-02-15: qty 50

## 2016-02-15 MED ORDER — VITAMIN C 500 MG PO TABS
1000.0000 mg | ORAL_TABLET | Freq: Every day | ORAL | Status: DC
  Administered 2016-02-15: 1000 mg via ORAL
  Filled 2016-02-15: qty 2

## 2016-02-15 MED ORDER — PROMETHAZINE HCL 25 MG/ML IJ SOLN: 6.2500 mg | Freq: Four times a day (QID) | INTRAMUSCULAR | Status: DC | PRN

## 2016-02-15 MED ORDER — FENTANYL CITRATE (PF) 100 MCG/2ML IJ SOLN
INTRAMUSCULAR | Status: DC | PRN
  Administered 2016-02-15 (×2): 50 ug via INTRAVENOUS

## 2016-02-15 MED ORDER — CEFAZOLIN IN D5W 1 GM/50ML IV SOLN
1.0000 g | Freq: Three times a day (TID) | INTRAVENOUS | Status: DC
  Filled 2016-02-15 (×2): qty 50

## 2016-02-15 MED ORDER — PROMETHAZINE HCL 25 MG RE SUPP: 25.0000 mg | Freq: Four times a day (QID) | RECTAL | Status: DC | PRN

## 2016-02-15 MED ORDER — DIPHENHYDRAMINE HCL 25 MG PO CAPS: 25.0000 mg | ORAL_CAPSULE | Freq: Four times a day (QID) | ORAL | Status: DC | PRN

## 2016-02-15 MED ORDER — VITAMIN C 500 MG PO TABS: 500.0000 mg | ORAL_TABLET | Freq: Every day | ORAL | 0 refills | Status: DC

## 2016-02-15 MED ORDER — HYDROCODONE-ACETAMINOPHEN 7.5-325 MG PO TABS
1.0000 | ORAL_TABLET | ORAL | Status: DC | PRN
  Administered 2016-02-15 (×2): 1 via ORAL
  Filled 2016-02-15 (×2): qty 1

## 2016-02-15 MED ORDER — ONDANSETRON HCL 4 MG PO TABS: 4.0000 mg | ORAL_TABLET | Freq: Four times a day (QID) | ORAL | Status: DC | PRN

## 2016-02-15 MED ORDER — MIDAZOLAM HCL 2 MG/2ML IJ SOLN
INTRAMUSCULAR | Status: AC
  Filled 2016-02-15: qty 2

## 2016-02-15 MED ORDER — OXYCODONE HCL 5 MG PO TABS: 5.0000 mg | ORAL_TABLET | ORAL | Status: DC | PRN

## 2016-02-15 MED ORDER — ONDANSETRON HCL 4 MG/2ML IJ SOLN: 4.0000 mg | Freq: Four times a day (QID) | INTRAMUSCULAR | Status: DC | PRN

## 2016-02-15 MED ORDER — OXYCODONE-ACETAMINOPHEN 5-325 MG PO TABS
2.0000 | ORAL_TABLET | ORAL | Status: DC | PRN
Start: 2016-02-15 — End: 2016-02-15

## 2016-02-15 MED ORDER — ONDANSETRON HCL 4 MG/2ML IJ SOLN
INTRAMUSCULAR | Status: DC | PRN
  Administered 2016-02-15: 4 mg via INTRAVENOUS

## 2016-02-15 MED ORDER — CEFAZOLIN SODIUM 1 G IJ SOLR
INTRAMUSCULAR | Status: DC | PRN
  Administered 2016-02-15: 2 g via INTRAMUSCULAR

## 2016-02-15 SURGICAL SUPPLY — 64 items
BANDAGE ACE 4X5 VEL STRL LF (GAUZE/BANDAGES/DRESSINGS) IMPLANT
BANDAGE ELASTIC 3 VELCRO ST LF (GAUZE/BANDAGES/DRESSINGS) IMPLANT
BANDAGE ELASTIC 6 VELCRO ST LF (GAUZE/BANDAGES/DRESSINGS) ×1 IMPLANT
BIT DRILL 1.8 CANN MAX VPC (BIT) ×1 IMPLANT
BNDG ADH 5X4 AIR PERM ELC (GAUZE/BANDAGES/DRESSINGS) ×1
BNDG CMPR 9X4 STRL LF SNTH (GAUZE/BANDAGES/DRESSINGS) ×1
BNDG COHESIVE 4X5 TAN STRL (GAUZE/BANDAGES/DRESSINGS) ×2 IMPLANT
BNDG COHESIVE 4X5 WHT NS (GAUZE/BANDAGES/DRESSINGS) ×1 IMPLANT
BNDG ESMARK 4X9 LF (GAUZE/BANDAGES/DRESSINGS) ×2 IMPLANT
BNDG GAUZE ELAST 4 BULKY (GAUZE/BANDAGES/DRESSINGS) IMPLANT
CORDS BIPOLAR (ELECTRODE) ×2 IMPLANT
COVER MAYO STAND STRL (DRAPES) ×2 IMPLANT
COVER SURGICAL LIGHT HANDLE (MISCELLANEOUS) ×2 IMPLANT
CUFF TOURNIQUET SINGLE 18IN (TOURNIQUET CUFF) ×2 IMPLANT
CUFF TOURNIQUET SINGLE 24IN (TOURNIQUET CUFF) IMPLANT
DRAPE INCISE IOBAN 66X45 STRL (DRAPES) ×2 IMPLANT
DRAPE OEC MINIVIEW 54X84 (DRAPES) IMPLANT
DRAPE ORTHO SPLIT 77X108 STRL (DRAPES) ×4
DRAPE SURG ORHT 6 SPLT 77X108 (DRAPES) ×2 IMPLANT
DRAPE U-SHAPE 47X51 STRL (DRAPES) ×2 IMPLANT
DRESSING ADAPTIC 1/2  N-ADH (PACKING) ×1 IMPLANT
DRSG ADAPTIC 3X8 NADH LF (GAUZE/BANDAGES/DRESSINGS) IMPLANT
GAUZE SPONGE 4X4 12PLY STRL (GAUZE/BANDAGES/DRESSINGS) IMPLANT
GLOVE BIOGEL PI IND STRL 8.5 (GLOVE) ×1 IMPLANT
GLOVE BIOGEL PI INDICATOR 8.5 (GLOVE) ×1
GLOVE SURG ORTHO 8.0 STRL STRW (GLOVE) ×2 IMPLANT
GOWN STRL REUS W/ TWL LRG LVL3 (GOWN DISPOSABLE) ×2 IMPLANT
GOWN STRL REUS W/ TWL XL LVL3 (GOWN DISPOSABLE) ×1 IMPLANT
GOWN STRL REUS W/TWL LRG LVL3 (GOWN DISPOSABLE) ×4
GOWN STRL REUS W/TWL XL LVL3 (GOWN DISPOSABLE) ×2
K-WIRE COCR 0.9X95 (WIRE) ×4
KIT BASIN OR (CUSTOM PROCEDURE TRAY) ×2 IMPLANT
KIT ROOM TURNOVER OR (KITS) ×2 IMPLANT
KWIRE COCR 0.9X95 (WIRE) IMPLANT
LOOP VESSEL MAXI BLUE (MISCELLANEOUS) IMPLANT
MANIFOLD NEPTUNE II (INSTRUMENTS) ×2 IMPLANT
NDL HYPO 25GX1X1/2 BEV (NEEDLE) IMPLANT
NEEDLE HYPO 25GX1X1/2 BEV (NEEDLE) IMPLANT
NS IRRIG 1000ML POUR BTL (IV SOLUTION) ×2 IMPLANT
PACK ORTHO EXTREMITY (CUSTOM PROCEDURE TRAY) ×2 IMPLANT
PAD ARMBOARD 7.5X6 YLW CONV (MISCELLANEOUS) ×4 IMPLANT
PAD CAST 4YDX4 CTTN HI CHSV (CAST SUPPLIES) IMPLANT
PADDING CAST ABS 6INX4YD NS (CAST SUPPLIES) ×1
PADDING CAST ABS COTTON 6X4 NS (CAST SUPPLIES) IMPLANT
PADDING CAST COTTON 4X4 STRL (CAST SUPPLIES) ×2
SCREW COMP HEADLESS 2.5X18 (Screw) ×2 IMPLANT
SOAP 2 % CHG 4 OZ (WOUND CARE) ×2 IMPLANT
SPONGE GAUZE 4X4 12PLY STER LF (GAUZE/BANDAGES/DRESSINGS) ×1 IMPLANT
SUCTION FRAZIER HANDLE 10FR (MISCELLANEOUS)
SUCTION TUBE FRAZIER 10FR DISP (MISCELLANEOUS) IMPLANT
SUT MERSILENE 4 0 P 3 (SUTURE) IMPLANT
SUT PROLENE 4 0 PS 2 18 (SUTURE) ×1 IMPLANT
SUT VIC AB 2-0 CT1 27 (SUTURE)
SUT VIC AB 2-0 CT1 TAPERPNT 27 (SUTURE) IMPLANT
SUT VIC AB 2-0 SH 27 (SUTURE) ×4
SUT VIC AB 2-0 SH 27X BRD (SUTURE) IMPLANT
SUT VIC AB 2-0 SH 27XBRD (SUTURE) IMPLANT
SYR CONTROL 10ML LL (SYRINGE) IMPLANT
TAPE STRIPS DRAPE STRL (GAUZE/BANDAGES/DRESSINGS) ×1 IMPLANT
TOWEL OR 17X24 6PK STRL BLUE (TOWEL DISPOSABLE) ×2 IMPLANT
TOWEL OR 17X26 10 PK STRL BLUE (TOWEL DISPOSABLE) ×4 IMPLANT
TUBE CONNECTING 12X1/4 (SUCTIONS) IMPLANT
UNDERPAD 30X30 (UNDERPADS AND DIAPERS) ×2 IMPLANT
WATER STERILE IRR 1000ML POUR (IV SOLUTION) ×2 IMPLANT

## 2016-02-15 NOTE — Discharge Summary (Signed)
Physician Discharge Summary  Patient ID: Brenda Frost MRN: [id number] DOB/AGE: Sep 19, 1965 50 y.o.  Admit date: 02/15/2016 Discharge date: 02/15/2016  Admission Diagnoses: Right elbow fracture Past Medical History:  Diagnosis Date  . Hyperlipidemia   . Hypothyroidism     Discharge Diagnoses:  Active Problems:   Closed fracture of right proximal radius   Surgeries: Procedure(s): OPEN REDUCTION INTERNAL FIXATION (ORIF) ELBOW/OLECRANON FRACTURE on 02/15/2016    Consultants: none  Discharged Condition: Improved  Hospital Course: Brenda Frost is an 50 y.o. female who was admitted 02/15/2016 with a chief complaint of No chief complaint on file. , and found to have a diagnosis of Right elbow fracture.  They were brought to the operating room on 02/15/2016 and underwent Procedure(s): OPEN REDUCTION INTERNAL FIXATION (ORIF) ELBOW/OLECRANON FRACTURE.    They were given perioperative antibiotics: Anti-infectives    Start     Dose/Rate Route Frequency Ordered Stop   02/15/16 1800  ceFAZolin (ANCEF) IVPB 1 g/50 mL premix     1 g 100 mL/hr over 30 Minutes Intravenous Every 8 hours 02/15/16 1051     02/15/16 1015  ceFAZolin (ANCEF) IVPB 1 g/50 mL premix     1 g 100 mL/hr over 30 Minutes Intravenous NOW 02/15/16 1003 02/15/16 1037   02/15/16 1005  ceFAZolin (ANCEF) 1 GM/50ML IVPB    Comments:  Orie Fisherman   : cabinet override      02/15/16 1005 02/15/16 2214    .  They were given sequential compression devices, early ambulation, and ambulation  for DVT prophylaxis.  Recent vital signs: Patient Vitals for the past 24 hrs:  BP Temp Temp src Pulse Resp SpO2  02/15/16 1042 122/74 98 F (36.7 C) Oral 75 17 98 %  02/15/16 1024 126/78 97.3 F (36.3 C) - 70 17 100 %  02/15/16 1010 104/60 - - 78 20 100 %  02/15/16 0954 115/79 97.3 F (36.3 C) - 65 17 98 %  .  Recent laboratory studies: Dg Shoulder Right  Result Date: 02/13/2016 CLINICAL DATA:  Fall through ceiling, entire  right arm pain and unable to move EXAM: RIGHT SHOULDER - 2+ VIEW COMPARISON:  None. FINDINGS: No acute fracture or dislocation. Right clavicle appears intact. AC joint does not appear widened. Right lung apex clear. Possible small about of calcific tendinitis. IMPRESSION: No acute osseous abnormality. Electronically Signed   By: Donavan Foil M.D.   On: 02/13/2016 19:46   Dg Elbow Complete Right  Result Date: 02/13/2016 CLINICAL DATA:  Fall through ceiling, entire right arm pain. Pt unable to move arm, very limited ROM due to pain. EXAM: RIGHT ELBOW - COMPLETE 3+ VIEW COMPARISON:  None. FINDINGS: There is a displaced fracture at the base of the radial head with associated lateral angulation deformity. Suspect fracture involvement of the articular surface but not clearly delineated. Distal humerus and proximal ulna appear intact and normally aligned. No convincing evidence of a joint effusion at the right elbow, although slightly oblique patient positioning limits characterization IMPRESSION: 1. Displaced fracture at the base of the right radial head with associated angulation deformity. Suspect fracture extension to the articular surface but not clearly delineated. 2. No other osseous fracture or dislocation seen. Electronically Signed   By: Franki Cabot M.D.   On: 02/13/2016 19:49   Dg Wrist Complete Right  Result Date: 02/13/2016 CLINICAL DATA:  Fall, right arm pain, limited range of motion. EXAM: RIGHT WRIST - COMPLETE 3+ VIEW COMPARISON:  None. FINDINGS: Four views of  the right wrist are provided. Osseous alignment is normal. Bone mineralization is normal. No fracture line or displaced fracture fragment identified. Adjacent soft tissues are unremarkable. IMPRESSION: Negative. Electronically Signed   By: Franki Cabot M.D.   On: 02/13/2016 19:50   Ct Elbow Right Wo Contrast  Result Date: 02/13/2016 CLINICAL DATA:  Fall with elbow fracture EXAM: CT OF THE RIGHT ELBOW WITHOUT CONTRAST TECHNIQUE:  Multidetector CT imaging was performed according to the standard protocol. Multiplanar CT image reconstructions were also generated. COMPARISON:  Radiographs 02/13/2016 FINDINGS: There is a comminuted and impacted intra-articular fracture involving the radial head and neck. Approximately 4 mm of depression of the lateral aspect of the fractured radial head. Radial capitellar alignment is maintained. There is nondisplaced linear cortical fracture extending down the proximal shaft of the radius laterally. There is no evidence for distal humerus fracture. The imaged ulna appears intact. There is fat pad distention consistent with elbow effusion. IMPRESSION: Comminuted, impacted intra-articular fracture involving the radial head and neck with slight depression of the lateral radial head fracture fragment. No dislocation evident. Elbow effusion is present. Electronically Signed   By: Donavan Foil M.D.   On: 02/13/2016 22:40   Dg Humerus Right  Result Date: 02/13/2016 CLINICAL DATA:  Fall through ceiling, entire right arm pain. Pt unable to move arm, very limited ROM due to pain. EXAM: RIGHT HUMERUS - 2+ VIEW COMPARISON:  None. FINDINGS: There is no evidence of fracture or other focal bone lesions. Soft tissues are unremarkable. IMPRESSION: Negative. Electronically Signed   By: Franki Cabot M.D.   On: 02/13/2016 19:45    Discharge Medications:     Medication List    TAKE these medications   docusate sodium 100 MG capsule Commonly known as:  COLACE Take 1 capsule (100 mg total) by mouth 2 (two) times daily.   levothyroxine 75 MCG tablet Commonly known as:  SYNTHROID, LEVOTHROID Take 75 mcg by mouth daily. Reported on 08/11/2015   methocarbamol 500 MG tablet Commonly known as:  ROBAXIN Take 1 tablet (500 mg total) by mouth 4 (four) times daily.   ondansetron 4 MG tablet Commonly known as:  ZOFRAN Take 1 tablet (4 mg total) by mouth every 6 (six) hours.   oxyCODONE-acetaminophen 5-325 MG  tablet Commonly known as:  PERCOCET/ROXICET Take 2 tablets by mouth every 4 (four) hours as needed for severe pain.   vitamin C 500 MG tablet Commonly known as:  ASCORBIC ACID Take 1 tablet (500 mg total) by mouth daily.       Diagnostic Studies: Dg Shoulder Right  Result Date: 02/13/2016 CLINICAL DATA:  Fall through ceiling, entire right arm pain and unable to move EXAM: RIGHT SHOULDER - 2+ VIEW COMPARISON:  None. FINDINGS: No acute fracture or dislocation. Right clavicle appears intact. AC joint does not appear widened. Right lung apex clear. Possible small about of calcific tendinitis. IMPRESSION: No acute osseous abnormality. Electronically Signed   By: Donavan Foil M.D.   On: 02/13/2016 19:46   Dg Elbow Complete Right  Result Date: 02/13/2016 CLINICAL DATA:  Fall through ceiling, entire right arm pain. Pt unable to move arm, very limited ROM due to pain. EXAM: RIGHT ELBOW - COMPLETE 3+ VIEW COMPARISON:  None. FINDINGS: There is a displaced fracture at the base of the radial head with associated lateral angulation deformity. Suspect fracture involvement of the articular surface but not clearly delineated. Distal humerus and proximal ulna appear intact and normally aligned. No convincing evidence of a joint  effusion at the right elbow, although slightly oblique patient positioning limits characterization IMPRESSION: 1. Displaced fracture at the base of the right radial head with associated angulation deformity. Suspect fracture extension to the articular surface but not clearly delineated. 2. No other osseous fracture or dislocation seen. Electronically Signed   By: Franki Cabot M.D.   On: 02/13/2016 19:49   Dg Wrist Complete Right  Result Date: 02/13/2016 CLINICAL DATA:  Fall, right arm pain, limited range of motion. EXAM: RIGHT WRIST - COMPLETE 3+ VIEW COMPARISON:  None. FINDINGS: Four views of the right wrist are provided. Osseous alignment is normal. Bone mineralization is normal. No  fracture line or displaced fracture fragment identified. Adjacent soft tissues are unremarkable. IMPRESSION: Negative. Electronically Signed   By: Franki Cabot M.D.   On: 02/13/2016 19:50   Ct Elbow Right Wo Contrast  Result Date: 02/13/2016 CLINICAL DATA:  Fall with elbow fracture EXAM: CT OF THE RIGHT ELBOW WITHOUT CONTRAST TECHNIQUE: Multidetector CT imaging was performed according to the standard protocol. Multiplanar CT image reconstructions were also generated. COMPARISON:  Radiographs 02/13/2016 FINDINGS: There is a comminuted and impacted intra-articular fracture involving the radial head and neck. Approximately 4 mm of depression of the lateral aspect of the fractured radial head. Radial capitellar alignment is maintained. There is nondisplaced linear cortical fracture extending down the proximal shaft of the radius laterally. There is no evidence for distal humerus fracture. The imaged ulna appears intact. There is fat pad distention consistent with elbow effusion. IMPRESSION: Comminuted, impacted intra-articular fracture involving the radial head and neck with slight depression of the lateral radial head fracture fragment. No dislocation evident. Elbow effusion is present. Electronically Signed   By: Donavan Foil M.D.   On: 02/13/2016 22:40   Dg Humerus Right  Result Date: 02/13/2016 CLINICAL DATA:  Fall through ceiling, entire right arm pain. Pt unable to move arm, very limited ROM due to pain. EXAM: RIGHT HUMERUS - 2+ VIEW COMPARISON:  None. FINDINGS: There is no evidence of fracture or other focal bone lesions. Soft tissues are unremarkable. IMPRESSION: Negative. Electronically Signed   By: Franki Cabot M.D.   On: 02/13/2016 19:45    They benefited maximally from their hospital stay and there were no complications.     Disposition: 01-Home or Self Care  Follow-up Information    Follow up In 12 days.        Linna Hoff, MD.   Specialty:  Orthopedic Surgery Contact  information: [address] [place] [address] [id number]            Signed: Linna Hoff 02/15/2016, 1:38 PM

## 2016-02-15 NOTE — Anesthesia Postprocedure Evaluation (Signed)
Anesthesia Post Note  Patient: Brenda Frost  Procedure(s) Performed: Procedure(s) (LRB): OPEN REDUCTION INTERNAL FIXATION (ORIF) ELBOW/OLECRANON FRACTURE (Right)  Patient location during evaluation: PACU Anesthesia Type: General and Regional Level of consciousness: awake Pain management: pain level controlled Vital Signs Assessment: post-procedure vital signs reviewed and stable Respiratory status: spontaneous breathing Cardiovascular status: stable Postop Assessment: no signs of nausea or vomiting Anesthetic complications: no    Last Vitals:  Vitals:   02/15/16 1024 02/15/16 1042  BP: 126/78 122/74  Pulse: 70 75  Resp: 17 17  Temp: 36.3 C 36.7 C    Last Pain:  Vitals:   02/15/16 1042  TempSrc: Oral                 Lashara Urey

## 2016-02-15 NOTE — Progress Notes (Signed)
Discharge instructions provided to patient and daughter.  Prescriptions sent electronically by MD to patient's pharmacy.  Sling and ice applied.  Escorted via wheelchair.  No questions at time of discharge.

## 2016-02-15 NOTE — Progress Notes (Signed)
Orthopedic Tech Progress Note Patient Details:  Brenda Frost [birth year] [id number]  Ortho Devices Type of Ortho Device: Arm sling Ortho Device/Splint Location: rue Ortho Device/Splint Interventions: Application   Hildred Priest 02/15/2016, 10:19 AM Viewed order from doctor's order list

## 2016-02-15 NOTE — Anesthesia Preprocedure Evaluation (Addendum)
Anesthesia Evaluation  Patient identified by MRN, date of birth, ID band Patient awake    Reviewed: Allergy & Precautions, NPO status , Patient's Chart, lab work & pertinent test results  History of Anesthesia Complications Negative for: history of anesthetic complications  Airway Mallampati: II  TM Distance: >3 FB Neck ROM: Full    Dental   Pulmonary neg pulmonary ROS,    breath sounds clear to auscultation       Cardiovascular negative cardio ROS   Rhythm:Regular     Neuro/Psych negative neurological ROS  negative psych ROS   GI/Hepatic negative GI ROS, Neg liver ROS,   Endo/Other  Hypothyroidism   Renal/GU negative Renal ROS     Musculoskeletal Right elbow fracture   Abdominal   Peds  Hematology negative hematology ROS (+)   Anesthesia Other Findings   Reproductive/Obstetrics                             Anesthesia Physical Anesthesia Plan  ASA: II  Anesthesia Plan: General and Regional   Post-op Pain Management:    Induction: Intravenous  Airway Management Planned: LMA and Oral ETT  Additional Equipment: None  Intra-op Plan:   Post-operative Plan: Extubation in OR  Informed Consent: I have reviewed the patients History and Physical, chart, labs and discussed the procedure including the risks, benefits and alternatives for the proposed anesthesia with the patient or authorized representative who has indicated his/her understanding and acceptance.   Dental advisory given  Plan Discussed with: CRNA and Surgeon  Anesthesia Plan Comments:        Anesthesia Quick Evaluation

## 2016-02-15 NOTE — Anesthesia Procedure Notes (Addendum)
Anesthesia Regional Block:  Supraclavicular block  Pre-Anesthetic Checklist: ,, timeout performed, Correct Patient, Correct Site, Correct Laterality, Correct Procedure, Correct Position, site marked, Risks and benefits discussed,  Surgical consent,  Pre-op evaluation,  At surgeon's request and post-op pain management  Laterality: Right  Prep: chloraprep       Needles:  Injection technique: Single-shot  Needle Type: Echogenic Needle          Additional Needles:  Procedures: ultrasound guided (picture in chart) Supraclavicular block Narrative:  Injection made incrementally with aspirations every 5 mL.  Performed by: Personally   Additional Notes: H+P and labs reviewed, risks and benefits discussed with patient, procedure tolerated well without complications

## 2016-02-15 NOTE — Transfer of Care (Signed)
Immediate Anesthesia Transfer of Care Note  Patient: Brenda Frost  Procedure(s) Performed: Procedure(s): OPEN REDUCTION INTERNAL FIXATION (ORIF) ELBOW/OLECRANON FRACTURE (Right)  Patient Location: PACU  Anesthesia Type:General  Level of Consciousness: awake and patient cooperative  Airway & Oxygen Therapy: Patient Spontanous Breathing  Post-op Assessment: Report given to RN and Post -op Vital signs reviewed and stable  Post vital signs: Reviewed and stable  Last Vitals: There were no vitals filed for this visit.  Last Pain: There were no vitals filed for this visit.       Complications: No apparent anesthesia complications

## 2016-02-15 NOTE — Anesthesia Procedure Notes (Signed)
Procedure Name: LMA Insertion Date/Time: 02/15/2016 8:10 AM Performed by: Lance Coon Pre-anesthesia Checklist: Patient identified, Emergency Drugs available, Suction available, Patient being monitored and Timeout performed Patient Re-evaluated:Patient Re-evaluated prior to inductionOxygen Delivery Method: Circle system utilized Preoxygenation: Pre-oxygenation with 100% oxygen Intubation Type: IV induction LMA: LMA inserted LMA Size: 4.0 Number of attempts: 1 Placement Confirmation: positive ETCO2 and breath sounds checked- equal and bilateral Tube secured with: Tape Dental Injury: Teeth and Oropharynx as per pre-operative assessment

## 2016-02-15 NOTE — Discharge Instructions (Signed)
KEEP BANDAGE CLEAN AND DRY CALL OFFICE FOR F/U APPT [phone] in 12 days DR [facility] CELL [phone] ALSO CALL FOR THERAPY APPT [phone] EXT 42 KEEP HAND ELEVATED ABOVE HEART OK TO APPLY ICE TO OPERATIVE AREA CONTACT OFFICE IF ANY WORSENING PAIN OR CONCERNS.

## 2016-02-16 MED FILL — METHOCARBAMOL 500 MG TABLET: 500 | 8 days supply | Qty: 30 | Fill #0

## 2016-02-16 NOTE — Op Note (Signed)
NAMESYMBA, RAMSDELL               ACCOUNT NO.:  [id number]  MEDICAL RECORD NO.:  [id number]  LOCATION:                                 FACILITY:  PHYSICIAN:  Linna Hoff IV, M.D.DATE OF BIRTH:  Jan 16, 1966  DATE OF PROCEDURE:  02/15/2016 DATE OF DISCHARGE:                              OPERATIVE REPORT   PREOPERATIVE DIAGNOSIS:  Right proximal radius type 2 radial head fracture.  POSTOPERATIVE DIAGNOSIS:  Right proximal radius type 2 radial head fracture.  ATTENDING PHYSICIAN:  Linna Hoff, M.D. who scrubbed and present for the entire procedure.  ASSISTANT SURGEON:  None.  ANESTHESIA:  General via LMA.  PROCEDURE: 1. Open treatment of right displaced radial head fracture requiring     internal fixation. 2. Radiographs, 3 views, right elbow.  SURGICAL IMPLANTS:  Biomet compression screws 2.4 mm screws x2, 18 mm in length.  SURGICAL INDICATIONS:  Ms. __________ is a right-hand-dominant female sustained a closed injury to her right elbow after falling through her attic ceiling.  Risks, benefits, and alternatives were discussed in detail with the patient and signed informed consent was obtained.  Risks include, but not limited to bleeding, infection, damage to nearby nerves, arteries, tendons; nonunion, malunion, hardware failure, loss of motion of the wrist and digits, incomplete relief of symptoms, and need for further surgical intervention.  DESCRIPTION OF PROCEDURE:  The patient was properly identified in the preoperative holding area, marked with a permanent marker made on the right elbow to indicate correct operative site.  The patient was brought back to the operating room, placed supine on the anesthesia room table, where general anesthesia was administered.  The patient tolerated this well.  A well-padded tourniquet placed on the right brachium and sealed with 1000 drape.  The right upper extremity was then prepped and draped in normal sterile fashion.   Time-out was called, correct side was identified, and procedure then begun.  Attention was then turned to the right elbow.  A curvilinear incision was made directly over the posterolateral aspect of the elbow.  Dissection was carried down through the skin and subcutaneous tissue.  Extensor aponeurosis was incised longitudinally in going through the interval between the __________ and extensor interval.  The joint capsulotomy was then carried out.  Joint arthrotomy was then carried out.  The fracture hematoma was then evacuated.  The joint was then exposed.  The patient did have a type 2 radial head fracture with 1 large lateral depressed fragment.  Thus using various instrumentations and carefully elevated back up and felt to be in very good position.  Once this radial head fracture was then elevated up to, K-wires were then placed into the fracture fragment engaged with and then drilled and the two 2.4 mm screws were then placed.  There were good purchase and fixation of the screws.  The wound was then thoroughly irrigated.  Final radiographs were obtained of the elbow.  Following stress radiography was then carried out and the extensor interval was then closed with 2-0 Vicryl.  The extensor aponeurosis was closed with 2-0 Vicryl.  Subcutaneous tissues closed with Monocryl, and the skin closed with a running 4-0 Prolene.  Steri-  Strips were applied.  10 mL of 0.25% Marcaine infiltrated in the joint. Sterile compressive bandage were applied.  The patient tolerated the procedure well, returned to recovery room in good condition.  The patient was placed in a well-padded long-arm splint.  Extubated and taken to recovery room in good condition.  Intraoperative radiographs, AP and oblique views of the elbow do show the radial head fixation in place.  There was good alignment of both planes.  PLAN: The patient will be admitted overnight for IV antibiotics and pain control, seen back in the  office in approximately 2 weeks for wound check, 3 views of the elbow and begin a postoperative ORIF of the radial head protocol with screw, long-arm splint, and eval.  Radiographs at each visit.     Melrose Nakayama, M.D.   ______________________________ Melrose Nakayama, M.D.    FWO/MEDQ  D:  02/15/2016  T:  02/16/2016  Job:  [id number]

## 2016-02-17 ENCOUNTER — Encounter (HOSPITAL_COMMUNITY): Payer: Self-pay | Admitting: Orthopedic Surgery

## 2016-02-17 NOTE — Addendum Note (Signed)
Addendum  created 02/17/16 1108 by Oleta Mouse, MD   Anesthesia Intra Blocks edited, Sign clinical note

## 2016-03-01 DIAGNOSIS — S52124D Nondisplaced fracture of head of right radius, subsequent encounter for closed fracture with routine healing: Secondary | ICD-10-CM | POA: Diagnosis not present

## 2016-03-04 DIAGNOSIS — S52124D Nondisplaced fracture of head of right radius, subsequent encounter for closed fracture with routine healing: Secondary | ICD-10-CM | POA: Diagnosis not present

## 2016-03-11 DIAGNOSIS — S52124D Nondisplaced fracture of head of right radius, subsequent encounter for closed fracture with routine healing: Secondary | ICD-10-CM | POA: Diagnosis not present

## 2016-03-12 DIAGNOSIS — S52124D Nondisplaced fracture of head of right radius, subsequent encounter for closed fracture with routine healing: Secondary | ICD-10-CM | POA: Diagnosis not present

## 2016-03-17 DIAGNOSIS — S52124D Nondisplaced fracture of head of right radius, subsequent encounter for closed fracture with routine healing: Secondary | ICD-10-CM | POA: Diagnosis not present

## 2016-03-19 DIAGNOSIS — S52124D Nondisplaced fracture of head of right radius, subsequent encounter for closed fracture with routine healing: Secondary | ICD-10-CM | POA: Diagnosis not present

## 2016-03-24 DIAGNOSIS — S52124D Nondisplaced fracture of head of right radius, subsequent encounter for closed fracture with routine healing: Secondary | ICD-10-CM | POA: Diagnosis not present

## 2016-03-25 DIAGNOSIS — S52124D Nondisplaced fracture of head of right radius, subsequent encounter for closed fracture with routine healing: Secondary | ICD-10-CM | POA: Diagnosis not present

## 2016-03-31 DIAGNOSIS — S52124D Nondisplaced fracture of head of right radius, subsequent encounter for closed fracture with routine healing: Secondary | ICD-10-CM | POA: Diagnosis not present

## 2016-04-20 DIAGNOSIS — E785 Hyperlipidemia, unspecified: Secondary | ICD-10-CM | POA: Diagnosis not present

## 2016-04-20 DIAGNOSIS — Z131 Encounter for screening for diabetes mellitus: Secondary | ICD-10-CM | POA: Diagnosis not present

## 2016-04-20 DIAGNOSIS — Z0000 Encounter for general adult medical examination without abnormal findings: Secondary | ICD-10-CM | POA: Diagnosis not present

## 2016-04-20 DIAGNOSIS — E039 Hypothyroidism, unspecified: Secondary | ICD-10-CM | POA: Diagnosis not present

## 2016-04-22 DIAGNOSIS — H5203 Hypermetropia, bilateral: Secondary | ICD-10-CM | POA: Diagnosis not present

## 2016-04-22 DIAGNOSIS — S52124D Nondisplaced fracture of head of right radius, subsequent encounter for closed fracture with routine healing: Secondary | ICD-10-CM | POA: Diagnosis not present

## 2016-04-28 MED FILL — LEVOTHYROXINE 88 MCG TABLET: 88 | 90 days supply | Qty: 90 | Fill #0

## 2016-06-14 DIAGNOSIS — Z01419 Encounter for gynecological examination (general) (routine) without abnormal findings: Secondary | ICD-10-CM | POA: Diagnosis not present

## 2016-06-14 DIAGNOSIS — Z6828 Body mass index (BMI) 28.0-28.9, adult: Secondary | ICD-10-CM | POA: Diagnosis not present

## 2016-06-14 DIAGNOSIS — Z1231 Encounter for screening mammogram for malignant neoplasm of breast: Secondary | ICD-10-CM | POA: Diagnosis not present

## 2016-07-01 DIAGNOSIS — Z13820 Encounter for screening for osteoporosis: Secondary | ICD-10-CM | POA: Diagnosis not present

## 2016-11-11 MED FILL — LEVOTHYROXINE 88 MCG TABLET: 88 | 90 days supply | Qty: 90 | Fill #1

## 2017-04-01 ENCOUNTER — Encounter: Payer: Self-pay | Admitting: Family Medicine

## 2017-04-01 ENCOUNTER — Ambulatory Visit: Payer: 59 | Admitting: Family Medicine

## 2017-04-01 VITALS — BP 132/88 | HR 68 | Temp 97.8°F | Resp 16 | Ht 65.0 in | Wt 178.0 lb

## 2017-04-01 DIAGNOSIS — R0789 Other chest pain: Secondary | ICD-10-CM | POA: Diagnosis not present

## 2017-04-01 DIAGNOSIS — R079 Chest pain, unspecified: Secondary | ICD-10-CM | POA: Diagnosis not present

## 2017-04-01 DIAGNOSIS — E039 Hypothyroidism, unspecified: Secondary | ICD-10-CM | POA: Diagnosis not present

## 2017-04-01 LAB — COMPREHENSIVE METABOLIC PANEL
ALT: 13 U/L (ref 0–35)
AST: 13 U/L (ref 0–37)
Albumin: 4.1 g/dL (ref 3.5–5.2)
Alkaline Phosphatase: 84 U/L (ref 39–117)
BUN: 23 mg/dL (ref 6–23)
CO2: 30 mEq/L (ref 19–32)
Calcium: 9.5 mg/dL (ref 8.4–10.5)
Chloride: 103 mEq/L (ref 96–112)
Creatinine, Ser: 0.87 mg/dL (ref 0.40–1.20)
GFR: 73.08 mL/min (ref >60.00)
Glucose, Bld: 99 mg/dL (ref 70–99)
Potassium: 3.9 mEq/L (ref 3.5–5.1)
Sodium: 138 mEq/L (ref 135–145)
Total Bilirubin: 0.8 mg/dL (ref 0.2–1.2)
Total Protein: 7.1 g/dL (ref 6.0–8.3)

## 2017-04-01 LAB — CBC
HCT: 45.7 % (ref 36.0–46.0)
Hemoglobin: 14.9 g/dL (ref 12.0–15.0)
MCHC: 32.7 g/dL (ref 30.0–36.0)
MCV: 91.1 fl (ref 78.0–100.0)
Platelets: 196 10*3/uL (ref 150.0–400.0)
RBC: 5.02 Mil/uL (ref 3.87–5.11)
RDW: 13.4 % (ref 11.5–15.5)
WBC: 6 10*3/uL (ref 4.0–10.5)

## 2017-04-01 LAB — TSH: TSH: 5.62 u[IU]/mL — ABNORMAL HIGH (ref 0.35–4.50)

## 2017-04-01 LAB — POCT GLUCOSE (DEVICE FOR HOME USE): POC Glucose: 84 mg/dl (ref 70–99)

## 2017-04-01 LAB — T4, FREE: Free T4: 1 ng/dL (ref 0.60–1.60)

## 2017-04-01 MED ORDER — LEVOTHYROXINE SODIUM 88 MCG PO TABS: 88.0000 ug | ORAL_TABLET | Freq: Every day | ORAL | 3 refills | Status: DC

## 2017-04-01 MED FILL — LEVOTHYROXINE 88 MCG TABLET: 88 | 90 days supply | Qty: 90 | Fill #0

## 2017-04-02 NOTE — Progress Notes (Signed)
Brenda Frost is a 52 y.o. female is here for an acute visit.  Patient Care Team: Orpah Melter, MD as PCP - General (Family Medicine)   History of Present Illness:   HPI: See Assessment and Plan section for Problem Based Charting of issues discussed today.  Health Maintenance Due  Topic Date Due  . HIV Screening  09/03/1981  . MAMMOGRAM  09/03/2016  . COLONOSCOPY  09/03/2016  . INFLUENZA VACCINE  10/20/2016   Depression screen PHQ 2/9 02/14/2016  Decreased Interest 0  Down, Depressed, Hopeless 0  PHQ - 2 Score 0   PMHx, SurgHx, SocialHx, Medications, and Allergies were reviewed in the Visit Navigator and updated as appropriate.   Past Medical History:  Diagnosis Date  . Hyperlipidemia   . Hypothyroidism    Past Surgical History:  Procedure Laterality Date  . CESAREAN SECTION    . ORIF ELBOW FRACTURE Right 02/15/2016   Procedure: OPEN REDUCTION INTERNAL FIXATION (ORIF) ELBOW/OLECRANON FRACTURE;  Surgeon: Iran Planas, MD;  Location: [place];  Service: Orthopedics;  Laterality: Right;   Family History  Problem Relation Age of Onset  . Diabetes Mother   . Hyperlipidemia Mother   . Hypertension Mother   . Heart disease Maternal Grandmother   . CAD Neg Hx    Social History   Tobacco Use  . Smoking status: Never Smoker  . Smokeless tobacco: Never Used  Substance Use Topics  . Alcohol use: Yes    Alcohol/week: 1.2 oz    Types: 2 Cans of beer per week  . Drug use: No   Current Medications and Allergies:   .  levothyroxine (SYNTHROID, LEVOTHROID) 88 MCG tablet, Take 1 tablet (88 mcg total) by mouth daily., Disp: 90 tablet, Rfl: 3  No Known Allergies   Review of Systems:   Pertinent items are noted in the HPI. Otherwise, ROS is negative.  Vitals:   Vitals:   04/01/17 1404  BP: 132/88  Pulse: 68  Resp: 16  Temp: 97.8 F (36.6 C)  TempSrc: Oral  SpO2: 98%  Weight: 178 lb (80.7 kg)  Height: 5\' 5"  (1.651 m)     Body mass index is 29.62  kg/m.  Physical Exam:   Physical Exam  Constitutional: She is oriented to person, place, and time. She appears well-developed and well-nourished. No distress.  HENT:  Head: Normocephalic and atraumatic.  Right Ear: External ear normal.  Left Ear: External ear normal.  Nose: Nose normal.  Mouth/Throat: Oropharynx is clear and moist.  Eyes: Conjunctivae and EOM are normal. Pupils are equal, round, and reactive to light.  Neck: Normal range of motion. Neck supple. No hepatojugular reflux and no JVD present. No thyromegaly present.  Cardiovascular: Normal rate, regular rhythm, normal heart sounds, intact distal pulses and normal pulses.  No murmur heard. Pulmonary/Chest: Effort normal and breath sounds normal. No accessory muscle usage. No tachypnea.  Abdominal: Soft. Bowel sounds are normal.  Musculoskeletal: Normal range of motion.       Right lower leg: She exhibits no edema.       Left lower leg: She exhibits no edema.  Lymphadenopathy:    She has no cervical adenopathy.  Neurological: She is alert and oriented to person, place, and time.  Skin: Skin is warm and dry. Capillary refill takes less than 2 seconds. No rash noted. Nails show no clubbing.  Psychiatric: She has a normal mood and affect. Her behavior is normal.  Nursing note and vitals reviewed.  Results for orders  placed or performed in visit on 04/01/17  CBC  Result Value Ref Range   WBC 6.0 4.0 - 10.5 K/uL   RBC 5.02 3.87 - 5.11 Mil/uL   Platelets 196.0 150.0 - 400.0 K/uL   Hemoglobin 14.9 12.0 - 15.0 g/dL   HCT 45.7 36.0 - 46.0 %   MCV 91.1 78.0 - 100.0 fl   MCHC 32.7 30.0 - 36.0 g/dL   RDW 13.4 11.5 - 15.5 %  TSH  Result Value Ref Range   TSH 5.62 (H) 0.35 - 4.50 uIU/mL  T4, free  Result Value Ref Range   Free T4 1.00 0.60 - 1.60 ng/dL  Comprehensive metabolic panel  Result Value Ref Range   Sodium 138 135 - 145 mEq/L   Potassium 3.9 3.5 - 5.1 mEq/L   Chloride 103 96 - 112 mEq/L   CO2 30 19 - 32 mEq/L    Glucose, Bld 99 70 - 99 mg/dL   BUN 23 6 - 23 mg/dL   Creatinine, Ser 0.87 0.40 - 1.20 mg/dL   Total Bilirubin 0.8 0.2 - 1.2 mg/dL   Alkaline Phosphatase 84 39 - 117 U/L   AST 13 0 - 37 U/L   ALT 13 0 - 35 U/L   Total Protein 7.1 6.0 - 8.3 g/dL   Albumin 4.1 3.5 - 5.2 g/dL   Calcium 9.5 8.4 - 10.5 mg/dL   GFR 73.08 >60.00 mL/min  POCT Glucose (Device for Home Use)  Result Value Ref Range   Glucose Fasting, POC  70 - 99 mg/dL   POC Glucose 84 70 - 99 mg/dl   Assessment and Plan:   1. Atypical chest pain Patient is new to this clinic.  She was brought in by her husband as a walk-in.  She is a 52 year old woman with past medical history of hypothyroidism and dyspepsia, that presents with a recent few minute history of anterior chest pain.  She says that the pain radiated to her teeth and her left arm.  It lasted for a few minutes before going away.  She was sitting at that time.  She denies any headaches, dizziness, vision changes, shortness of breath, abdominal pain, nausea, vomiting, diarrhea, constipation, edema, rash, recent illness.  She had a very similar episode a few years ago and was admitted to [facility]  At that time, she had a negative cardiac workup.  She was told that it was likely GI related.  She denies any new exposures, medications, or other triggers.  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  They were actually going to be leaving today to do this today.  She denies any tobacco, alcohol, or drug use.  She exercises somewhat regularly without any issues.  At this time, her pain is gone.  She has no history of recent travel, no lower extremity pain or swelling, no palpitations, and no tachycardia.  She did eat breakfast but has not eaten lunch yet.  - EKG 12-Lead with normal sinus rhythm without ST or T changes. - POCT Glucose (Device for Home Use) at 84. - CBC within normal limits. - TSH slightly elevated,  see below. - T4, free, see below. - Comprehensive metabolic panel within normal limits.  All test and exam above are reassuring today.  We reviewed a differential diagnosis and precautions, especially to be taken over the next few days.  I offered to write a note for school so that she  could delay her presentation.  She declines at this time.  I advised her to go to an ER emergently if she develops symptoms again.  2. Acquired hypothyroidism Patient with history of hypothyroidism, treated with levothyroxine.  TSH is instantly mildly elevated today.  I offered to increase her dosage with the instructions on how to take medication correctly.  She agreed and medication was sent in.   . Reviewed expectations re: course of current medical issues. . Discussed self-management of symptoms. . Outlined signs and symptoms indicating need for more acute intervention. . Patient verbalized understanding and all questions were answered. Marland Kitchen Health Maintenance issues including appropriate healthy diet, exercise, and smoking avoidance were discussed with patient. . See orders for this visit as documented in the electronic medical record. . Patient received an After Visit Summary.  Briscoe Deutscher, DO [place], [place] [facility] 04/02/2017

## 2017-05-07 ENCOUNTER — Ambulatory Visit (INDEPENDENT_AMBULATORY_CARE_PROVIDER_SITE_OTHER): Payer: Self-pay | Admitting: Medical

## 2017-05-07 ENCOUNTER — Encounter: Payer: Self-pay | Admitting: Medical

## 2017-05-07 VITALS — BP 102/60 | HR 85 | Temp 98.4°F | Resp 16 | Wt 182.2 lb

## 2017-05-07 DIAGNOSIS — L239 Allergic contact dermatitis, unspecified cause: Secondary | ICD-10-CM

## 2017-05-07 NOTE — Progress Notes (Signed)
Subjective:  Brenda Frost is a 52 y.o. female who presents for evaluation of pruritic rash. Onset of symptoms was 2 weeks ago, and has been gradually worsening since that time.  Treatment to date:  moisturizer.  Patient recently travelled to [facility] and stayed in a hotel and used their hygiene products. Rash started on the left thigh and has now spread to both legs, arms and some on the torso. She denies SOB or facial swelling. She has not tried anything for her symptoms.   The following portions of the patient's history were reviewed and updated as appropriate:  allergies, current medications and past medical history.  Pertinent items are noted in HPI. Objective:  Physical Exam  Constitutional: She is oriented to person, place, and time. She appears well-developed and well-nourished. No distress.  HENT:  Head: Normocephalic and atraumatic.  Eyes: EOM are normal.  Cardiovascular: Normal rate.  Respiratory: Effort normal.  Neurological: She is alert and oriented to person, place, and time.  Skin: Skin is warm and dry. Rash noted. Rash is maculopapular (bilateral thighs, arms and torso). No erythema.     Psychiatric: She has a normal mood and affect.      Assessment:  Allergic dermatitis    Plan:  Suggested symptomatic OTC remedies.  Hydrocortisone cream to affected areas BID Zyrtec or Claritin during the day and Benadryl at night See PCP or dermatology if symptoms persist past 7-10 more days or worsen Go to [facility] if swelling of lips, tongue or face or SOB develops  Danielle Rankin 05/07/2017 3:41 PM

## 2017-05-07 NOTE — Patient Instructions (Addendum)
Contact Dermatitis Dermatitis is redness, soreness, and swelling (inflammation) of the skin. Contact dermatitis is a reaction to certain substances that touch the skin. You either touched something that irritated your skin, or you have allergies to something you touched. Follow these instructions at home: [place] your skin as needed.  Apply cool compresses to the affected areas.  Try taking a bath with: ? Epsom salts. Follow the instructions on the package. You can get these at a pharmacy or grocery store. ? Baking soda. Pour a small amount into the bath as told by your doctor. ? Colloidal oatmeal. Follow the instructions on the package. You can get this at a pharmacy or grocery store.  Try applying baking soda paste to your skin. Stir water into baking soda until it looks like paste.  Do not scratch your skin.  Bathe less often.  Bathe in lukewarm water. Avoid using hot water. Medicines  Take or apply over-the-counter and prescription medicines only as told by your doctor.  If you were prescribed an antibiotic medicine, take or apply your antibiotic as told by your doctor. Do not stop taking the antibiotic even if your condition starts to get better. General instructions  Keep all follow-up visits as told by your doctor. This is important.  Avoid the substance that caused your reaction. If you do not know what caused it, keep a journal to try to track what caused it. Write down: ? What you eat. ? What cosmetic products you use. ? What you drink. ? What you wear in the affected area. This includes jewelry.  If you were given a bandage (dressing), take care of it as told by your doctor. This includes when to change and remove it. Contact a doctor if:  You do not get better with treatment.  Your condition gets worse.  You have signs of infection such as: ? Swelling. ? Tenderness. ? Redness. ? Soreness. ? Warmth.  You have a fever.  You have new  symptoms. Get help right away if:  You have a very bad headache.  You have neck pain.  Your neck is stiff.  You throw up (vomit).  You feel very sleepy.  You see red streaks coming from the affected area.  Your bone or joint underneath the affected area becomes painful after the skin has healed.  The affected area turns darker.  You have trouble breathing. This information is not intended to replace advice given to you by your health care provider. Make sure you discuss any questions you have with your health care provider. Document Released: 01/03/2009 Document Revised: 08/14/2015 Document Reviewed: 07/24/2014 Elsevier Interactive Patient Education  2018 [employer].   Hydrocortisone cream twice daily for relief of itching and rash Zyrtec or Claritin during the day Benadryl at night  See PCP or dermatology if symptoms do not improve in 7-10 days  Go directly the [facility] ER if swelling of the lips or tongue or difficulty breathing

## 2017-05-10 ENCOUNTER — Encounter: Payer: Self-pay | Admitting: Physician Assistant

## 2017-05-10 ENCOUNTER — Ambulatory Visit: Payer: 59 | Admitting: Physician Assistant

## 2017-05-10 VITALS — BP 120/80 | HR 84 | Temp 98.1°F | Ht 65.0 in | Wt 181.2 lb

## 2017-05-10 DIAGNOSIS — R21 Rash and other nonspecific skin eruption: Secondary | ICD-10-CM

## 2017-05-10 MED ORDER — PREDNISONE 5 MG PO TABS: ORAL_TABLET | ORAL | 0 refills | Status: AC

## 2017-05-10 MED ORDER — METHYLPREDNISOLONE ACETATE 80 MG/ML IJ SUSP
80.0000 mg | Freq: Once | INTRAMUSCULAR | Status: AC
  Administered 2017-05-10: 80 mg via INTRAMUSCULAR

## 2017-05-10 MED FILL — predniSONE 5 MG TABS: 5 | 12 days supply | Qty: 42 | Fill #0

## 2017-05-10 NOTE — Patient Instructions (Addendum)
It was great to meet you!  Start oral prednisone tomorrow, take per directions. Take with breakfast.   If you develop any shortness of breath, lip or airway swelling, please go to the emergency department.

## 2017-05-10 NOTE — Progress Notes (Signed)
Brenda Frost is a 52 y.o. female here for a new problem.  I acted as a Education administrator for [employer], PA-C Anselmo Pickler, LPN  History of Present Illness:   Chief Complaint  Patient presents with  . Rash    Rash  This is a new problem. Episode onset: Pt c/o Rash x 12 days, started Feb 7th. The problem has been gradually worsening since onset. The affected locations include the right upper leg, right lower leg, right arm, left lower leg, left upper leg and left arm. The rash is characterized by itchiness, blistering and redness. Associated with: was in [place] in a hotel 2 1/2 weeks ago. Pertinent negatives include no anorexia, congestion, cough, diarrhea, eye pain, facial edema, fatigue, fever, joint pain, nail changes, rhinorrhea, shortness of breath, sore throat or vomiting. Past treatments include anti-itch cream and antihistamine (Benadryl, Zyrtec, OTC Hydrocortisone). The treatment provided no relief. There is no history of allergies, asthma, eczema or varicella.   She did stay at a hotel prior to onset and noticed symptoms started afterwards.  Past Medical History:  Diagnosis Date  . Hyperlipidemia   . Hypothyroidism      Social History   Socioeconomic History  . Marital status: Married    Spouse name: Not on file  . Number of children: Not on file  . Years of education: Not on file  . Highest education level: Not on file  Social Needs  . Financial resource strain: Not on file  . Food insecurity - worry: Not on file  . Food insecurity - inability: Not on file  . Transportation needs - medical: Not on file  . Transportation needs - non-medical: Not on file  Occupational History  . Occupation: Optician, dispensing: [employer]  Tobacco Use  . Smoking status: Never Smoker  . Smokeless tobacco: Never Used  Substance and Sexual Activity  . Alcohol use: Yes    Alcohol/week: 1.2 oz    Types: 2 Cans of beer per week  . Drug use: No  . Sexual activity: Not on file  Other  Topics Concern  . Not on file  Social History Narrative  . Not on file    Past Surgical History:  Procedure Laterality Date  . CESAREAN SECTION    . ORIF ELBOW FRACTURE Right 02/15/2016   Procedure: OPEN REDUCTION INTERNAL FIXATION (ORIF) ELBOW/OLECRANON FRACTURE;  Surgeon: Iran Planas, MD;  Location: [place];  Service: Orthopedics;  Laterality: Right;    Family History  Problem Relation Age of Onset  . Diabetes Mother   . Hyperlipidemia Mother   . Hypertension Mother   . Heart disease Maternal Grandmother   . CAD Neg Hx     No Known Allergies  Current Medications:   Current Outpatient Medications:  .  levothyroxine (SYNTHROID, LEVOTHROID) 88 MCG tablet, Take 1 tablet (88 mcg total) by mouth daily., Disp: 90 tablet, Rfl: 3 .  predniSONE (DELTASONE) 5 MG tablet, Take 6 tablets (30 mg total) by mouth daily with breakfast for 2 days, THEN 5 tablets (25 mg total) daily with breakfast for 2 days, THEN 4 tablets (20 mg total) daily with breakfast for 2 days, THEN 3 tablets (15 mg total) daily with breakfast for 2 days, THEN 2 tablets (10 mg total) daily with breakfast for 2 days, THEN 1 tablet (5 mg total) daily with breakfast for 2 days., Disp: 42 tablet, Rfl: 0   Review of Systems:   Review of Systems  Constitutional: Negative for  fatigue and fever.  HENT: Negative for congestion, rhinorrhea and sore throat.   Eyes: Negative for pain.  Respiratory: Negative for cough and shortness of breath.   Gastrointestinal: Negative for anorexia, diarrhea and vomiting.  Musculoskeletal: Negative for joint pain.  Skin: Positive for rash. Negative for nail changes.    Vitals:   Vitals:   05/10/17 1445  BP: 120/80  Pulse: 84  Temp: 98.1 F (36.7 C)  TempSrc: Oral  SpO2: 98%  Weight: 181 lb 4 oz (82.2 kg)  Height: 5\' 5"  (1.651 m)     Body mass index is 30.16 kg/m.  Physical Exam:   Physical Exam  Constitutional: She appears well-developed. She is cooperative.  Non-toxic  appearance. She does not have a sickly appearance. She does not appear ill. No distress.  Cardiovascular: Normal rate, regular rhythm, S1 normal, S2 normal, normal heart sounds and normal pulses.  No LE edema  Pulmonary/Chest: Effort normal and breath sounds normal.  Neurological: She is alert. GCS eye subscore is 4. GCS verbal subscore is 5. GCS motor subscore is 6.  Skin: Skin is warm, dry and intact. Rash noted.  Maculopapular erythematous diffuse rash on bilateral inner thighs, outer arms and abdomen -- no warmth, drainage, streaking noted  Psychiatric: She has a normal mood and affect. Her speech is normal and behavior is normal.  Nursing note and vitals reviewed.   Assessment and Plan:    Brenda Frost was seen today for rash.  Diagnoses and all orders for this visit:  Rash of unknown cause Dr. Juleen China in to also evaluate patient. No red flags on exam. No signs of infection. Patient has failed supportive care and OTC treatment. Received steroid injection today and tolerated well. Start oral prednisone taper tomorrow. I advised that she seek medical attention if she develops any airway/lip swelling or SOB. Follow-up if symptoms worsen or persist despite treatment.  -     methylPREDNISolone acetate (DEPO-MEDROL) injection 80 mg  Other orders -     predniSONE (DELTASONE) 5 MG tablet; Take 6 tablets (30 mg total) by mouth daily with breakfast for 2 days, THEN 5 tablets (25 mg total) daily with breakfast for 2 days, THEN 4 tablets (20 mg total) daily with breakfast for 2 days, THEN 3 tablets (15 mg total) daily with breakfast for 2 days, THEN 2 tablets (10 mg total) daily with breakfast for 2 days, THEN 1 tablet (5 mg total) daily with breakfast for 2 days.    . Reviewed expectations re: course of current medical issues. . Discussed self-management of symptoms. . Outlined signs and symptoms indicating need for more acute intervention. . Patient verbalized understanding and all questions were  answered. . See orders for this visit as documented in the electronic medical record. . Patient received an After-Visit Summary.  CMA or LPN served as scribe during this visit. History, Physical, and Plan performed by medical provider. Documentation and orders reviewed and attested to.  Inda Coke, PA-C

## 2017-06-01 ENCOUNTER — Encounter: Payer: Self-pay | Admitting: Family Medicine

## 2017-06-01 ENCOUNTER — Ambulatory Visit: Payer: 59 | Admitting: Family Medicine

## 2017-06-01 VITALS — BP 99/68 | HR 85 | Temp 98.3°F | Resp 20 | Ht 65.0 in | Wt 182.5 lb

## 2017-06-01 DIAGNOSIS — R6889 Other general symptoms and signs: Secondary | ICD-10-CM

## 2017-06-01 DIAGNOSIS — B029 Zoster without complications: Secondary | ICD-10-CM | POA: Diagnosis not present

## 2017-06-01 LAB — POC INFLUENZA A&B (BINAX/QUICKVUE)
INFLUENZA A, POC: NEGATIVE
Influenza B, POC: NEGATIVE

## 2017-06-01 MED ORDER — PREDNISONE 20 MG PO TABS
ORAL_TABLET | ORAL | 0 refills | Status: DC
Start: 2017-06-01 — End: 2017-06-29

## 2017-06-01 MED ORDER — VALACYCLOVIR HCL 1 G PO TABS: 1000.0000 mg | ORAL_TABLET | Freq: Three times a day (TID) | ORAL | 0 refills | Status: DC

## 2017-06-01 MED FILL — valACYclovir HCL 1 GM TABS: 1 | 7 days supply | Qty: 21 | Fill #0

## 2017-06-01 MED FILL — predniSONE 20 MG TABS: 20 | 10 days supply | Qty: 18 | Fill #0

## 2017-06-01 NOTE — Patient Instructions (Signed)
Start valtrex and prednisone today.  You do have shingles.  You should consider your self infectious until blisters are not fluid filled and fever resolved for > 48 hours.  Try OTC lidocaine patches and shingles cream (numbing cream)   Shingles Shingles is an infection that causes a painful skin rash and fluid-filled blisters. Shingles is caused by the same virus that causes chickenpox. Shingles only develops in people who:  Have had chickenpox.  Have gotten the chickenpox vaccine. (This is rare.)  The first symptoms of shingles may be itching, tingling, or pain in an area on your skin. A rash will follow in a few days or weeks. The rash is usually on one side of the body in a bandlike or beltlike pattern. Over time, the rash turns into fluid-filled blisters that break open, scab over, and dry up. Medicines may:  Help you manage pain.  Help you recover more quickly.  Help to prevent long-term problems.  Follow these instructions at home: Medicines  Take medicines only as told by your doctor.  Apply an anti-itch or numbing cream to the affected area as told by your doctor. Blister and Rash Care  Take a cool bath or put cool compresses on the area of the rash or blisters as told by your doctor. This may help with pain and itching.  Keep your rash covered with a loose bandage (dressing). Wear loose-fitting clothing.  Keep your rash and blisters clean with mild soap and cool water or as told by your doctor.  Check your rash every day for signs of infection. These include redness, swelling, and pain that lasts or gets worse.  Do not pick your blisters.  Do not scratch your rash. General instructions  Rest as told by your doctor.  Keep all follow-up visits as told by your doctor. This is important.  Until your blisters scab over, your infection can cause chickenpox in people who have never had it or been vaccinated against it. To prevent this from happening, avoid touching  other people or being around other people, especially: ? Babies. ? Pregnant women. ? Children who have eczema. ? Elderly people who have transplants. ? People who have chronic illnesses, such as leukemia or AIDS. Contact a doctor if:  Your pain does not get better with medicine.  Your pain does not get better after the rash heals.  Your rash looks infected. Signs of infection include: ? Redness. ? Swelling. ? Pain that lasts or gets worse. Get help right away if:  The rash is on your face or nose.  You have pain in your face, pain around your eye area, or loss of feeling on one side of your face.  You have ear pain or you have ringing in your ear.  You have loss of taste.  Your condition gets worse. This information is not intended to replace advice given to you by your health care provider. Make sure you discuss any questions you have with your health care provider. Document Released: 08/25/2007 Document Revised: 11/02/2015 Document Reviewed: 12/18/2013 Elsevier Interactive Patient Education  [employer].

## 2017-06-01 NOTE — Progress Notes (Signed)
Brenda Frost , 1965-12-11, 52 y.o., female MRN: [id number] Patient Care Team    Relationship Specialty Notifications Start End  Briscoe Deutscher, DO PCP - General Family Medicine  05/10/17     Chief Complaint  Patient presents with  . URI    fever,body aches,congestion,headache  . Rash    started friday under breasts     Subjective: Pt presents for an OV with complaints of rash of 1 day duration. Patient was treated for a rash 05/10/2017 with a prednisone taper. She reports this rash is different in presentation. Her rash prior responded to the prednisone taper and appeared more of a dermatitis-like reaction. The current rash that started with a discomfort first underneath her arm 2 days ago. She reports she thought she had a abrasion under her arm causing discomfort. Towards the end of the day yesterday she started having a low-grade fever, body aches and a headache. She thought maybe she was having symptoms of influenza since many people in her office have had the flu. She had her Flu shot UTD 2018. This morning she noticed the rash is blistered and she is concerned she has shingles. The rash is now located between her breast, under left axilla. She does endorse increased stress and anxiety. Her thyroid medication was recently adjusted due to elevated TSH. She has had gynecological issues, and which is being followed up by gynecology.  Depression screen [facility] 2/9 06/01/2017 02/14/2016 08/11/2015  Decreased Interest 0 0 0  Down, Depressed, Hopeless 0 0 0  PHQ - 2 Score 0 0 0    No Known Allergies Social History   Tobacco Use  . Smoking status: Never Smoker  . Smokeless tobacco: Never Used  Substance Use Topics  . Alcohol use: Yes    Alcohol/week: 1.2 oz    Types: 2 Cans of beer per week   Past Medical History:  Diagnosis Date  . Hyperlipidemia   . Hypothyroidism    Past Surgical History:  Procedure Laterality Date  . CESAREAN SECTION    . ORIF ELBOW FRACTURE Right 02/15/2016    Procedure: OPEN REDUCTION INTERNAL FIXATION (ORIF) ELBOW/OLECRANON FRACTURE;  Surgeon: Iran Planas, MD;  Location: [facility];  Service: Orthopedics;  Laterality: Right;   Family History  Problem Relation Age of Onset  . Diabetes Mother   . Hyperlipidemia Mother   . Hypertension Mother   . Heart disease Maternal Grandmother   . CAD Neg Hx    Allergies as of 06/01/2017   No Known Allergies     Medication List        Accurate as of 06/01/17 12:25 PM. Always use your most recent med list.          levothyroxine 88 MCG tablet Commonly known as:  SYNTHROID, LEVOTHROID Take 1 tablet (88 mcg total) by mouth daily.   predniSONE 20 MG tablet Commonly known as:  DELTASONE 60 mg x3d, 40 mg x3d, 20 mg x2d, 10 mg x2d   valACYclovir 1000 MG tablet Commonly known as:  VALTREX Take 1 tablet (1,000 mg total) by mouth 3 (three) times daily.       All past medical history, surgical history, allergies, family history, immunizations andmedications were updated in the EMR today and reviewed under the history and medication portions of their EMR.     ROS: Negative, with the exception of above mentioned in HPI   Objective:  BP 99/68 (BP Location: Left Arm, Patient Position: Sitting, Cuff Size: Large)   Pulse  85   Temp 98.3 F (36.8 C)   Resp 20   Ht 5\' 5"  (1.651 m)   Wt 182 lb 8 oz (82.8 kg)   SpO2 100%   BMI 30.37 kg/m  Body mass index is 30.37 kg/m. Gen: Afebrile. No acute distress. Nontoxic in appearance, well developed, well nourished.  HENT: AT. [place]. Bilateral TM visualized without erythema or fullness. MMM, no oral lesions. Bilateral nares mild erythema, mild drainage no swelling. Throat without erythema or exudates. No cough, no hoarseness, no sinus pressure. Eyes:Pupils Equal Round Reactive to light, Extraocular movements intact,  Conjunctiva without redness, discharge or icterus. Neck/lymp/endocrine: Supple,no lymphadenopathy CV: RRR  Chest: CTAB, no wheeze or crackles. Good  air movement, normal resp effort.  Skin: between breast on sternum, underneath left axilla and scapular rash present. Appearance of rash erythema base with vesicles. No drainage or signs of superinfection., no purpura or petechiae.  Neuro: Normal gait. PERLA. EOMi. Alert. Oriented x3   No exam data present No results found. Results for orders placed or performed in visit on 06/01/17 (from the past 24 hour(s))  POC Influenza A&B (Binax test)     Status: Normal   Collection Time: 06/01/17 11:07 AM  Result Value Ref Range   Influenza A, POC Negative Negative   Influenza B, POC Negative Negative    Assessment/Plan: Brenda Frost is a 52 y.o. female present for OV for  Flu-like symptoms - POC Influenza A&B (Binax test)--> negative Herpes zoster without complication Patient's rash is consistent with shingles outbreak following approximately left T5 dermatome.  - Valtrex and prednisone prescribed. Education on shingles provided. Patient advised to avoid presence around elderly, babies in pregnant women until 48 hours without fever and blisters are crusting over. Patient reports she did not need a work excuse today.  Recommended she try over the counter lidocaine patches or creams for comfort. - recommended she follow-up with her gynecologist and PCP concerning ongoing medical conditions that could be causing her to be immunocompromised including her thyroid condition.  - she is also under increased stress currently which also could be contributing to shingles outbreak. - follow-up with PCP in one week.   Reviewed expectations re: course of current medical issues.  Discussed self-management of symptoms.  Outlined signs and symptoms indicating need for more acute intervention.  Patient verbalized understanding and all questions were answered.  Patient received an After-Visit Summary.    Orders Placed This Encounter  Procedures  . POC Influenza A&B (Binax test)     Note is  dictated utilizing voice recognition software. Although note has been proof read prior to signing, occasional typographical errors still can be missed. If any questions arise, please do not hesitate to call for verification.   electronically signed by:  Howard Pouch, DO  [place]

## 2017-06-02 NOTE — Progress Notes (Signed)
Called patient states she is feeling better. Will call if any questions.

## 2017-06-03 NOTE — Progress Notes (Signed)
Spoke with patient. She will monitor her rash over the weekend. If not better she will call. New patient appointment 06/29/17.

## 2017-06-29 ENCOUNTER — Encounter: Payer: Self-pay | Admitting: Family Medicine

## 2017-06-29 ENCOUNTER — Ambulatory Visit (INDEPENDENT_AMBULATORY_CARE_PROVIDER_SITE_OTHER): Payer: 59 | Admitting: Family Medicine

## 2017-06-29 VITALS — BP 122/74 | HR 86 | Temp 98.2°F | Ht 65.0 in | Wt 180.4 lb

## 2017-06-29 DIAGNOSIS — N951 Menopausal and female climacteric states: Secondary | ICD-10-CM

## 2017-06-29 DIAGNOSIS — E049 Nontoxic goiter, unspecified: Secondary | ICD-10-CM

## 2017-06-29 DIAGNOSIS — Z0000 Encounter for general adult medical examination without abnormal findings: Secondary | ICD-10-CM

## 2017-06-29 DIAGNOSIS — Z13220 Encounter for screening for lipoid disorders: Secondary | ICD-10-CM | POA: Diagnosis not present

## 2017-06-29 DIAGNOSIS — E559 Vitamin D deficiency, unspecified: Secondary | ICD-10-CM

## 2017-06-29 DIAGNOSIS — R635 Abnormal weight gain: Secondary | ICD-10-CM

## 2017-06-29 LAB — LIPID PANEL
Cholesterol: 307 mg/dL — ABNORMAL HIGH (ref 0–200)
HDL: 83.1 mg/dL (ref >39.00)
LDL Cholesterol: 212 mg/dL — ABNORMAL HIGH (ref 0–99)
NonHDL: 224
Total CHOL/HDL Ratio: 4
Triglycerides: 62 mg/dL (ref 0.0–149.0)
VLDL: 12.4 mg/dL (ref 0.0–40.0)

## 2017-06-29 LAB — TSH: TSH: 0.56 u[IU]/mL (ref 0.35–4.50)

## 2017-06-29 LAB — T4, FREE: Free T4: 1.14 ng/dL (ref 0.60–1.60)

## 2017-06-29 LAB — T3, FREE: T3, Free: 3.2 pg/mL (ref 2.3–4.2)

## 2017-06-29 NOTE — Progress Notes (Signed)
Subjective:    Phylliss MELONEY FELD is a 52 y.o. female and is here for a comprehensive physical exam.  Health Maintenance Due  Topic Date Due  . MAMMOGRAM  09/03/2016  . COLONOSCOPY  09/03/2016   PMHx, SurgHx, SocialHx, Medications, and Allergies were reviewed in the Visit Navigator and updated as appropriate.   Past Medical History:  Diagnosis Date  . Hyperlipidemia   . Hypothyroidism    Past Surgical History:  Procedure Laterality Date  . CESAREAN SECTION    . ORIF ELBOW FRACTURE Right 02/15/2016   Procedure: OPEN REDUCTION INTERNAL FIXATION (ORIF) ELBOW/OLECRANON FRACTURE;  Surgeon: Iran Planas, MD;  Location: [facility];  Service: Orthopedics;  Laterality: Right;   Family History  Problem Relation Age of Onset  . Diabetes Mother   . Hyperlipidemia Mother   . Hypertension Mother   . Heart disease Maternal Grandmother   . CAD Neg Hx    Social History   Tobacco Use  . Smoking status: Never Smoker  . Smokeless tobacco: Never Used  Substance Use Topics  . Alcohol use: Yes    Alcohol/week: 1.2 oz    Types: 2 Cans of beer per week  . Drug use: No    Review of Systems:   Pertinent items are noted in the HPI. Otherwise, ROS is negative.  Objective:   BP 122/74   Pulse 86   Temp 98.2 F (36.8 C) (Oral)   Ht 5\' 5"  (1.651 m)   Wt 180 lb 6.4 oz (81.8 kg)   SpO2 98%   BMI 30.02 kg/m    Wt Readings from Last 3 Encounters:  06/29/17 180 lb 6.4 oz (81.8 kg)  06/01/17 182 lb 8 oz (82.8 kg)  05/10/17 181 lb 4 oz (82.2 kg)     Ht Readings from Last 3 Encounters:  06/29/17 5\' 5"  (1.651 m)  06/01/17 5\' 5"  (1.651 m)  05/10/17 5\' 5"  (1.651 m)   General appearance: alert, cooperative and appears stated age. Head: normocephalic, without obvious abnormality, atraumatic. Neck: no adenopathy, supple, symmetrical, trachea midline; thyroid not enlarged, symmetric, no tenderness/mass/nodules. Lungs: clear to auscultation bilaterally. Heart: regular rate and rhythm Abdomen:  soft, non-tender; no masses,  no organomegaly. Extremities: extremities normal, atraumatic, no cyanosis or edema. Skin: skin color, texture, turgor normal, no rashes or lesions. Lymph: cervical, supraclavicular, and axillary nodes normal; no abnormal inguinal nodes palpated. Neurologic: grossly normal.  Assessment/Plan:   Lacee was seen today for establish care.  Diagnoses and all orders for this visit:  Weight gain  Goiter -     TSH -     T4, free -     T3, free -     Thyroid peroxidase antibody  Perimenopausal vasomotor symptoms -     FSH/LH  Vitamin D deficiency -     Vitamin D 1,25 dihydroxy  Lipid screening -     Lipid panel   Patient Counseling: [x]    Nutrition: Stressed importance of moderation in sodium/caffeine intake, saturated fat and cholesterol, caloric balance, sufficient intake of fresh fruits, vegetables, fiber, calcium, iron, and 1 mg of folate supplement per day (for females capable of pregnancy).  [x]    Stressed the importance of regular exercise.   [x]    Substance Abuse: Discussed cessation/primary prevention of tobacco, alcohol, or other drug use; driving or other dangerous activities under the influence; availability of treatment for abuse.   [x]    Injury prevention: Discussed safety belts, safety helmets, smoke detector, smoking near bedding or upholstery.   [  x]   Sexuality: Discussed sexually transmitted diseases, partner selection, use of condoms, avoidance of unintended pregnancy  and contraceptive alternatives.  [x]    Dental health: Discussed importance of regular tooth brushing, flossing, and dental visits.  [x]    Health maintenance and immunizations reviewed. Please refer to Health maintenance section.   Briscoe Deutscher, DO [place]

## 2017-07-02 LAB — VITAMIN D 1,25 DIHYDROXY
Vitamin D 1, 25 (OH)2 Total: 40 pg/mL (ref 18–72)
Vitamin D2 1, 25 (OH)2: <8 pg/mL
Vitamin D3 1, 25 (OH)2: 40 pg/mL

## 2017-07-02 LAB — FSH/LH
FSH: 152.8 m[IU]/mL — ABNORMAL HIGH
LH: 62.9 m[IU]/mL

## 2017-07-02 LAB — THYROID PEROXIDASE ANTIBODY: Thyroperoxidase Ab SerPl-aCnc: 1 IU/mL (ref <9)

## 2017-07-05 DIAGNOSIS — Z6830 Body mass index (BMI) 30.0-30.9, adult: Secondary | ICD-10-CM | POA: Diagnosis not present

## 2017-07-05 DIAGNOSIS — Z1231 Encounter for screening mammogram for malignant neoplasm of breast: Secondary | ICD-10-CM | POA: Diagnosis not present

## 2017-07-05 DIAGNOSIS — Z01419 Encounter for gynecological examination (general) (routine) without abnormal findings: Secondary | ICD-10-CM | POA: Diagnosis not present

## 2017-07-05 MED FILL — PROGESTERONE 100 MG CAPSULE: 100 | 30 days supply | Qty: 30 | Fill #0

## 2017-07-05 MED FILL — ESTRADIOL 0.05 MG/24HR PTTW: 0.05 | 28 days supply | Qty: 8 | Fill #0

## 2017-07-06 ENCOUNTER — Other Ambulatory Visit: Payer: Self-pay | Admitting: Obstetrics and Gynecology

## 2017-07-06 DIAGNOSIS — R928 Other abnormal and inconclusive findings on diagnostic imaging of breast: Secondary | ICD-10-CM

## 2017-07-12 MED FILL — LEVOTHYROXINE 88 MCG TABLET: 88 | 90 days supply | Qty: 90 | Fill #1

## 2017-07-13 ENCOUNTER — Ambulatory Visit
Admission: RE | Admit: 2017-07-13 | Discharge: 2017-07-13 | Disposition: A | Discharge status: Home / Self Care | Payer: 59 | Source: Ambulatory Visit | Attending: Obstetrics and Gynecology | Admitting: Obstetrics and Gynecology

## 2017-07-13 DIAGNOSIS — R928 Other abnormal and inconclusive findings on diagnostic imaging of breast: Secondary | ICD-10-CM

## 2017-07-13 DIAGNOSIS — N6320 Unspecified lump in the left breast, unspecified quadrant: Secondary | ICD-10-CM | POA: Diagnosis not present

## 2017-07-13 DIAGNOSIS — R922 Inconclusive mammogram: Secondary | ICD-10-CM | POA: Diagnosis not present

## 2017-07-29 MED FILL — PROGESTERONE 100 MG CAPSULE: 100 | 30 days supply | Qty: 30 | Fill #1

## 2017-08-04 MED FILL — ESTRADIOL 0.05 MG/24HR PTTW: 0.05 | 28 days supply | Qty: 8 | Fill #1

## 2017-08-12 ENCOUNTER — Encounter: Payer: Self-pay | Admitting: Obstetrics and Gynecology

## 2017-08-25 ENCOUNTER — Encounter (INDEPENDENT_AMBULATORY_CARE_PROVIDER_SITE_OTHER): Payer: 59

## 2017-09-01 ENCOUNTER — Encounter (INDEPENDENT_AMBULATORY_CARE_PROVIDER_SITE_OTHER): Payer: Self-pay | Admitting: Family Medicine

## 2017-09-01 ENCOUNTER — Ambulatory Visit (INDEPENDENT_AMBULATORY_CARE_PROVIDER_SITE_OTHER): Payer: 59 | Admitting: Family Medicine

## 2017-09-01 VITALS — BP 95/64 | HR 65 | Temp 97.6°F | Ht 65.0 in | Wt 183.0 lb

## 2017-09-01 DIAGNOSIS — R0602 Shortness of breath: Secondary | ICD-10-CM

## 2017-09-01 DIAGNOSIS — Z1331 Encounter for screening for depression: Secondary | ICD-10-CM

## 2017-09-01 DIAGNOSIS — E038 Other specified hypothyroidism: Secondary | ICD-10-CM | POA: Diagnosis not present

## 2017-09-01 DIAGNOSIS — Z9189 Other specified personal risk factors, not elsewhere classified: Secondary | ICD-10-CM | POA: Diagnosis not present

## 2017-09-01 DIAGNOSIS — E669 Obesity, unspecified: Secondary | ICD-10-CM | POA: Diagnosis not present

## 2017-09-01 DIAGNOSIS — Z6830 Body mass index (BMI) 30.0-30.9, adult: Secondary | ICD-10-CM | POA: Diagnosis not present

## 2017-09-01 DIAGNOSIS — R5383 Other fatigue: Secondary | ICD-10-CM | POA: Diagnosis not present

## 2017-09-01 DIAGNOSIS — E66811 Obesity, class 1: Secondary | ICD-10-CM

## 2017-09-01 DIAGNOSIS — Z0289 Encounter for other administrative examinations: Secondary | ICD-10-CM

## 2017-09-01 NOTE — Progress Notes (Signed)
Office: [phone]  /  Fax: [phone]   Dear Dr. Juleen Frost,   Thank you for referring Brenda Frost to our clinic. The following note includes my evaluation and treatment recommendations.  HPI:   Chief Complaint: OBESITY    Brenda Frost has been referred by Brenda Deutscher, DO for consultation regarding her obesity and obesity related comorbidities.    Brenda Frost (MR# [id number]) is a 52 y.o. female who presents on 09/01/2017 for obesity evaluation and treatment. Current BMI is Body mass index is 30.45 kg/m.Marland Kitchen Brenda Frost has been struggling with her weight for many years and has been unsuccessful in either losing weight, maintaining weight loss, or reaching her healthy weight goal.     Brenda Frost attended our information session and states she is currently in the action stage of change and ready to dedicate time achieving and maintaining a healthier weight. Brenda Frost is interested in becoming our patient and working on intensive lifestyle modifications including (but not limited to) diet, exercise and weight loss.    Brenda Frost states her family eats meals together she thinks her family will eat healthier with  her she struggles with family and or coworkers weight loss sabotage her desired weight loss is 48 lbs she started gaining weight at 41 her heaviest weight ever was 185 lbs she has significant food cravings issues  she snacks frequently in the evenings she skips meals frequently she is frequently drinking liquids with calories she frequently makes poor food choices she struggles with emotional eating    Brenda Frost feels her energy is lower than it should be. This has worsened with weight gain and has not worsened recently. Brenda Frost admits to daytime somnolence and  admits to waking up still tired. Patient is at risk for obstructive sleep apnea. Patent has a history of symptoms of daytime Brenda and morning headache. Patient generally gets 5 hours of sleep per night, and states they  generally have generally restful sleep. Snoring is not present. Apneic episodes are not present. Epworth Sleepiness Score is 12.  Dyspnea on exertion Brenda Frost notes increasing shortness of breath with exercising and seems to be worsening over time with weight gain. She notes getting out of breath sooner with activity than she used to. This has not gotten worse recently. Brenda Frost denies orthopnea.  Hyperlipidemia Brenda Frost has hyperlipidemia, she is not on statin, and attempting to improve her cholesterol levels with intensive lifestyle modification including a low saturated fat diet, exercise and weight loss. She denies any chest pain, claudication or myalgias.  Hypothyroidism Brenda Frost has a diagnosis of hypothyroidism. She is on Synthroid, mostly controlled. She still notes Brenda and heat intolerance but denies palpitations.  At risk for cardiovascular disease Brenda Frost is at a higher than average risk for cardiovascular disease due to obesity, hyperlipidemia, and hypothyroidism. She currently denies any chest pain.  Depression Screen Brenda Frost (modified PHQ-9) score was  Depression screen PHQ 2/9 09/01/2017  Decreased Interest 3  Down, Depressed, Hopeless 3  PHQ - 2 Score 6  Altered sleeping 1  Tired, decreased energy 2  Change in appetite 2  Feeling bad or failure about yourself  2  Trouble concentrating 3  Moving slowly or fidgety/restless 0  Suicidal thoughts 0  PHQ-9 Score 16  Difficult doing work/chores Not difficult at all    ALLERGIES: No Known Allergies  MEDICATIONS: Current Outpatient Medications on File Prior to Visit  Medication Sig Dispense Refill  . levothyroxine (SYNTHROID, LEVOTHROID) 88 MCG tablet Take 1 tablet (88  mcg total) by mouth daily. 90 tablet 3   No current facility-administered medications on file prior to visit.     PAST MEDICAL HISTORY: Past Medical History:  Diagnosis Date  . Hyperlipidemia   . Hypothyroidism     PAST SURGICAL  HISTORY: Past Surgical History:  Procedure Laterality Date  . CESAREAN SECTION    . ORIF ELBOW FRACTURE Right 02/15/2016   Procedure: OPEN REDUCTION INTERNAL FIXATION (ORIF) ELBOW/OLECRANON FRACTURE;  Surgeon: Brenda Planas, MD;  Location: [facility];  Service: Orthopedics;  Laterality: Right;    SOCIAL HISTORY: Social History   Tobacco Use  . Smoking status: Never Smoker  . Smokeless tobacco: Never Used  Substance Use Topics  . Alcohol use: Yes    Alcohol/week: 1.2 oz    Types: 2 Cans of beer per week  . Drug use: No    FAMILY HISTORY: Family History  Problem Relation Age of Onset  . Diabetes Mother   . Hyperlipidemia Mother   . Hypertension Mother   . Kidney disease Mother   . Heart disease Maternal Grandmother   . CAD Neg Hx   . Breast cancer Neg Hx     ROS: Review of Systems  Constitutional: Positive for malaise/Brenda. Negative for weight loss.  Eyes:       + Vision changes  Respiratory: Positive for shortness of breath (with exertion).   Cardiovascular: Negative for chest pain, palpitations, orthopnea and claudication.  Musculoskeletal: Negative for myalgias.  Skin: Positive for rash.  Endo/Heme/Allergies: Bruises/bleeds easily.       + Heat/cold intolerance    PHYSICAL EXAM: Blood pressure 95/64, pulse 65, temperature 97.6 F (36.4 C), temperature source Oral, height 5\' 5"  (1.651 m), weight 183 lb (83 kg), last menstrual period 04/25/2016, SpO2 100 %. Body mass index is 30.45 kg/m. Physical Exam  Constitutional: She is oriented to person, place, and time. She appears well-developed and well-nourished.  HENT:  Head: Normocephalic and atraumatic.  Nose: Nose normal.  Eyes: EOM are normal. No scleral icterus.  Neck: Normal range of motion. Neck supple. No thyromegaly present.  Cardiovascular: Normal rate and regular rhythm.  Pulmonary/Chest: Effort normal. No respiratory distress.  Abdominal: Soft. There is no tenderness.  + Obesity  Musculoskeletal:   Range of Motion normal in all 4 extremities Trace edema noted in bilateral lower extremities  Neurological: She is alert and oriented to person, place, and time. Coordination normal.  Skin: Skin is warm and dry.  Psychiatric: She has a normal Frost and affect. Her behavior is normal.  Vitals reviewed.   RECENT LABS AND TESTS: BMET    Component Value Date/Time   NA 138 04/01/2017 1357   K 3.9 04/01/2017 1357   CL 103 04/01/2017 1357   CO2 30 04/01/2017 1357   GLUCOSE 99 04/01/2017 1357   BUN 23 04/01/2017 1357   CREATININE 0.87 04/01/2017 1357   CREATININE 0.82 08/11/2015 1530   CALCIUM 9.5 04/01/2017 1357   GFRNONAA 85 08/11/2015 1530   GFRAA >89 08/11/2015 1530   No results found for: HGBA1C No results found for: INSULIN CBC    Component Value Date/Time   WBC 6.0 04/01/2017 1357   RBC 5.02 04/01/2017 1357   HGB 14.9 04/01/2017 1357   HCT 45.7 04/01/2017 1357   PLT 196.0 04/01/2017 1357   MCV 91.1 04/01/2017 1357   MCV 86.9 02/14/2016 1031   MCH 30.6 02/14/2016 1031   MCH 30.3 08/10/2013 0134   MCHC 32.7 04/01/2017 1357   RDW 13.4 04/01/2017  1357   LYMPHSABS 1.9 08/09/2013 1111   MONOABS 0.6 08/09/2013 1111   EOSABS 0.0 08/09/2013 1111   BASOSABS 0.0 08/09/2013 1111   Iron/TIBC/Ferritin/ %Sat No results found for: IRON, TIBC, FERRITIN, IRONPCTSAT Lipid Panel     Component Value Date/Time   CHOL 307 (H) 06/29/2017 0836   TRIG 62.0 06/29/2017 0836   HDL 83.10 06/29/2017 0836   CHOLHDL 4 06/29/2017 0836   VLDL 12.4 06/29/2017 0836   LDLCALC 212 (H) 06/29/2017 0836   Hepatic Function Panel     Component Value Date/Time   PROT 7.1 04/01/2017 1357   ALBUMIN 4.1 04/01/2017 1357   AST 13 04/01/2017 1357   ALT 13 04/01/2017 1357   ALKPHOS 84 04/01/2017 1357   BILITOT 0.8 04/01/2017 1357      Component Value Date/Time   TSH 0.56 06/29/2017 0836   TSH 5.62 (H) 04/01/2017 1357   TSH 2.36 08/11/2015 1530    ECG  shows NSR with a rate of 73 BPM INDIRECT  CALORIMETER done today shows a VO2 of 203 and a REE of 1416.  Her calculated basal metabolic rate is 7124 thus her basal metabolic rate is worse than expected.    ASSESSMENT AND PLAN: Other Brenda - Plan: EKG 12-Lead, Vitamin B12, CBC With Differential, Comprehensive metabolic panel, Folate, Hemoglobin A1c, Insulin, random, Lipid Panel With LDL/HDL Ratio, VITAMIN D 25 Hydroxy (Vit-D Deficiency, Fractures)  Shortness of breath on exertion - Plan: CBC With Differential  Other specified hypothyroidism - Plan: T3, T4, free, TSH  Depression screening  At risk for heart disease  Class 1 obesity with serious comorbidity and body mass index (BMI) of 30.0 to 30.9 in adult, unspecified obesity type  PLAN:  Brenda Brenda Frost was informed that her Brenda may be related to obesity, depression or many other causes. Labs will be ordered, and in the meanwhile Brenda Frost has agreed to work on diet, exercise and weight loss to help with Brenda. Proper sleep hygiene was discussed including the need for 7-8 hours of quality sleep each night. A sleep study was not ordered based on symptoms and Epworth score.  Dyspnea on exertion Brenda Frost shortness of breath appears to be obesity related and exercise induced. She has agreed to work on weight loss and gradually increase exercise to treat her exercise induced shortness of breath. If Brenda Frost follows our instructions and loses weight without improvement of her shortness of breath, we will plan to refer to pulmonology. We will monitor this condition regularly. Xian agrees to this plan.  Hyperlipidemia Brenda Frost was informed of the American Heart Association Guidelines emphasizing intensive lifestyle modifications as the first line treatment for hyperlipidemia. We discussed many lifestyle modifications today in depth, and Holli will continue to work on decreasing saturated fats such as fatty red meat, butter and many fried foods. She will start diet and also increase vegetables  and lean protein in her diet and continue to work on exercise and weight loss efforts. We will check labs and Brenda Frost agrees to follow up with our clinic in 2 weeks.  Hypothyroidism Brenda Frost was informed of the importance of good thyroid control to help with weight loss efforts. She was also informed that supertheraputic thyroid levels are dangerous and will not improve weight loss results. Brenda Frost agrees to continue taking Synthroid, we will check labs and she agrees to follow up with our clinic in 2 weeks.  Cardiovascular risk counselling Brenda Frost was given extended (15 minutes) coronary artery disease prevention counseling today. She is 52 y.o.  female and has risk factors for heart disease including obesity, hyperlipidemia, and hypothyroidism. We discussed intensive lifestyle modifications today with an emphasis on specific weight loss instructions and strategies. Pt was also informed of the importance of increasing exercise and decreasing saturated fats to help prevent heart disease.  Depression Screen Brenda Frost had a strongly positive depression screening. Depression is commonly associated with obesity and often results in emotional eating behaviors. We will monitor this closely and work on CBT to help improve the non-hunger eating patterns. Referral to Psychology may be required if no improvement is seen as she continues in our clinic.  Obesity Brenda Frost is currently in the action stage of change and her goal is to continue with weight loss efforts. I recommend Brenda Frost begin the structured treatment plan as follows:  She has agreed to follow the Category 2 plan Fayne has been instructed to eventually work up to a goal of 150 minutes of combined cardio and strengthening exercise per week for weight loss and overall health benefits. We discussed the following Behavioral Modification Stratagies today: increasing lean protein intake, decreasing simple carbohydrates  and work on meal planning and easy cooking plans    She was informed of the importance of frequent follow up visits to maximize her success with intensive lifestyle modifications for her multiple health conditions. She was informed we would discuss her lab results at her next visit unless there is a critical issue that needs to be addressed sooner. Candice agreed to keep her next visit at the agreed upon time to discuss these results.    OBESITY BEHAVIORAL INTERVENTION VISIT  Today's visit was # 1 out of 22.  Starting weight: 183 lbs Starting date: 09/01/17 Today's weight : 183 lbs  Today's date: 09/01/2017 Total lbs lost to date: 0 (Patients must lose 7 lbs in the first 6 months to continue with counseling)   ASK: We discussed the diagnosis of obesity with Merril Abbe today and Elanora agreed to give [place] permission to discuss obesity behavioral modification therapy today.  ASSESS: Mahala has the diagnosis of obesity and her BMI today is 30.45 Saidee is in the action stage of change   ADVISE: Erina was educated on the multiple health risks of obesity as well as the benefit of weight loss to improve her health. She was advised of the need for long term treatment and the importance of lifestyle modifications.  AGREE: Multiple dietary modification options and treatment options were discussed and  Ladonne agreed to the above obesity treatment plan.   I, Trixie Dredge, am acting as transcriptionist for Dennard Nip, MD  I have reviewed the above documentation for accuracy and completeness, and I agree with the above. -Dennard Nip, MD

## 2017-09-02 LAB — COMPREHENSIVE METABOLIC PANEL
ALBUMIN: 3.9 g/dL (ref 3.5–5.5)
ALK PHOS: 108 IU/L (ref 39–117)
ALT: 19 IU/L (ref 0–32)
AST: 19 IU/L (ref 0–40)
Albumin/Globulin Ratio: 1.6 (ref 1.2–2.2)
BILIRUBIN TOTAL: 0.7 mg/dL (ref 0.0–1.2)
BUN / CREAT RATIO: 16 (ref 9–23)
BUN: 13 mg/dL (ref 6–24)
CO2: 24 mmol/L (ref 20–29)
CREATININE: 0.82 mg/dL (ref 0.57–1.00)
Calcium: 9.7 mg/dL (ref 8.7–10.2)
Chloride: 103 mmol/L (ref 96–106)
GFR calc non Af Amer: 84 mL/min/{1.73_m2} (ref >59)
GFR, EST AFRICAN AMERICAN: 96 mL/min/{1.73_m2} (ref >59)
GLOBULIN, TOTAL: 2.4 g/dL (ref 1.5–4.5)
Glucose: 98 mg/dL (ref 65–99)
Potassium: 4.6 mmol/L (ref 3.5–5.2)
SODIUM: 139 mmol/L (ref 134–144)
TOTAL PROTEIN: 6.3 g/dL (ref 6.0–8.5)

## 2017-09-02 LAB — FOLATE: Folate: 11.7 ng/mL (ref >3.0)

## 2017-09-02 LAB — CBC WITH DIFFERENTIAL
Basophils Absolute: 0 10*3/uL (ref 0.0–0.2)
Basos: 0 %
EOS (ABSOLUTE): 0.1 10*3/uL (ref 0.0–0.4)
EOS: 1 %
HEMATOCRIT: 43.8 % (ref 34.0–46.6)
Hemoglobin: 14.2 g/dL (ref 11.1–15.9)
Immature Grans (Abs): 0 10*3/uL (ref 0.0–0.1)
Immature Granulocytes: 0 %
LYMPHS ABS: 1.9 10*3/uL (ref 0.7–3.1)
Lymphs: 33 %
MCH: 29.5 pg (ref 26.6–33.0)
MCHC: 32.4 g/dL (ref 31.5–35.7)
MCV: 91 fL (ref 79–97)
MONOCYTES: 8 %
Monocytes Absolute: 0.5 10*3/uL (ref 0.1–0.9)
NEUTROS ABS: 3.2 10*3/uL (ref 1.4–7.0)
Neutrophils: 58 %
RBC: 4.82 x10E6/uL (ref 3.77–5.28)
RDW: 13.7 % (ref 12.3–15.4)
WBC: 5.7 10*3/uL (ref 3.4–10.8)

## 2017-09-02 LAB — VITAMIN B12: Vitamin B-12: 525 pg/mL (ref 232–1245)

## 2017-09-02 LAB — LIPID PANEL WITH LDL/HDL RATIO
CHOLESTEROL TOTAL: 265 mg/dL — AB (ref 100–199)
HDL: 79 mg/dL (ref >39)
LDL CALC: 173 mg/dL — AB (ref 0–99)
LDl/HDL Ratio: 2.2 ratio (ref 0.0–3.2)
Triglycerides: 67 mg/dL (ref 0–149)
VLDL CHOLESTEROL CAL: 13 mg/dL (ref 5–40)

## 2017-09-02 LAB — T4, FREE: FREE T4: 1.12 ng/dL (ref 0.82–1.77)

## 2017-09-02 LAB — T3: T3, Total: 73 ng/dL (ref 71–180)

## 2017-09-02 LAB — VITAMIN D 25 HYDROXY (VIT D DEFICIENCY, FRACTURES): Vit D, 25-Hydroxy: 15.8 ng/mL — ABNORMAL LOW (ref 30.0–100.0)

## 2017-09-02 LAB — HEMOGLOBIN A1C
Est. average glucose Bld gHb Est-mCnc: 120 mg/dL
Hgb A1c MFr Bld: 5.8 % — ABNORMAL HIGH (ref 4.8–5.6)

## 2017-09-02 LAB — INSULIN, RANDOM: INSULIN: 7.6 u[IU]/mL (ref 2.6–24.9)

## 2017-09-02 LAB — TSH: TSH: 0.89 u[IU]/mL (ref 0.450–4.500)

## 2017-09-15 ENCOUNTER — Ambulatory Visit (INDEPENDENT_AMBULATORY_CARE_PROVIDER_SITE_OTHER): Payer: 59 | Admitting: Family Medicine

## 2017-09-15 VITALS — BP 107/74 | HR 76 | Temp 97.7°F | Ht 65.0 in | Wt 181.0 lb

## 2017-09-15 DIAGNOSIS — E559 Vitamin D deficiency, unspecified: Secondary | ICD-10-CM

## 2017-09-15 DIAGNOSIS — E669 Obesity, unspecified: Secondary | ICD-10-CM | POA: Diagnosis not present

## 2017-09-15 DIAGNOSIS — Z9189 Other specified personal risk factors, not elsewhere classified: Secondary | ICD-10-CM

## 2017-09-15 DIAGNOSIS — E7849 Other hyperlipidemia: Secondary | ICD-10-CM | POA: Diagnosis not present

## 2017-09-15 DIAGNOSIS — Z6830 Body mass index (BMI) 30.0-30.9, adult: Secondary | ICD-10-CM

## 2017-09-15 MED ORDER — VITAMIN D (ERGOCALCIFEROL) 1.25 MG (50000 UNIT) PO CAPS: 50000.0000 [IU] | ORAL_CAPSULE | ORAL | 0 refills | Status: DC

## 2017-09-16 MED FILL — VIT D2 1.25 MG (50,000 UNIT: 1.25 MG | 28 days supply | Qty: 4 | Fill #0

## 2017-09-19 NOTE — Progress Notes (Signed)
Office: [phone]  /  Fax: [phone]   HPI:   Chief Complaint: OBESITY Brenda Frost is here to discuss her progress with her obesity treatment plan. She is on the Category 2 plan and is following her eating plan approximately 80 % of the time. She states she is doing crossfit and walking for 15-20 minutes 2 times per week. Brenda Frost did well with weight loss on Category 2 plan but struggled to follow her plan (especially dinner), after getting bad news about her daughter's health.  Her weight is 181 lb (82.1 kg) today and has had a weight loss of 2 pounds over a period of 2 weeks since her last visit. She has lost 2 lbs since starting treatment with [place].  Hyperlipidemia Brenda Frost has hyperlipidemia and LDL elevated but improved from 2 months ago, she has been trying to improve her cholesterol levels with intensive lifestyle modification including a low saturated fat diet, exercise and weight loss. She denies any chest pain, claudication or myalgias.  Vitamin D Deficiency Brenda Frost has a new diagnosis of vitamin D deficiency. She is not on Vit D, she notes fatigue and denies nausea, vomiting or muscle weakness.  At risk for diabetes Brenda Frost is at higher than average risk for developing diabetes due to her obesity. She currently denies polyuria or polydipsia.  ALLERGIES: No Known Allergies  MEDICATIONS: Current Outpatient Medications on File Prior to Visit  Medication Sig Dispense Refill  . levothyroxine (SYNTHROID, LEVOTHROID) 88 MCG tablet Take 1 tablet (88 mcg total) by mouth daily. 90 tablet 3   No current facility-administered medications on file prior to visit.     PAST MEDICAL HISTORY: Past Medical History:  Diagnosis Date  . Hyperlipidemia   . Hypothyroidism     PAST SURGICAL HISTORY: Past Surgical History:  Procedure Laterality Date  . CESAREAN SECTION    . ORIF ELBOW FRACTURE Right 02/15/2016   Procedure: OPEN REDUCTION INTERNAL FIXATION (ORIF) ELBOW/OLECRANON FRACTURE;   Surgeon: Iran Planas, MD;  Location: [facility];  Service: Orthopedics;  Laterality: Right;    SOCIAL HISTORY: Social History   Tobacco Use  . Smoking status: Never Smoker  . Smokeless tobacco: Never Used  Substance Use Topics  . Alcohol use: Yes    Alcohol/week: 1.2 oz    Types: 2 Cans of beer per week  . Drug use: No    FAMILY HISTORY: Family History  Problem Relation Age of Onset  . Diabetes Mother   . Hyperlipidemia Mother   . Hypertension Mother   . Kidney disease Mother   . Heart disease Maternal Grandmother   . CAD Neg Hx   . Breast cancer Neg Hx     ROS: Review of Systems  Constitutional: Positive for malaise/fatigue and weight loss.  Cardiovascular: Negative for chest pain and claudication.  Gastrointestinal: Negative for nausea and vomiting.  Genitourinary: Negative for frequency.  Musculoskeletal: Negative for myalgias.       Negative muscle weakness  Endo/Heme/Allergies: Negative for polydipsia.    PHYSICAL EXAM: Blood pressure 107/74, pulse 76, temperature 97.7 F (36.5 C), height 5\' 5"  (1.651 m), weight 181 lb (82.1 kg), last menstrual period 04/25/2016, SpO2 96 %. Body mass index is 30.12 kg/m. Physical Exam  Constitutional: She is oriented to person, place, and time. She appears well-developed and well-nourished.  Cardiovascular: Normal rate.  Pulmonary/Chest: Effort normal.  Musculoskeletal: Normal range of motion.  Neurological: She is oriented to person, place, and time.  Skin: Skin is warm and dry.  Psychiatric: She has  a normal mood and affect. Her behavior is normal.  Vitals reviewed.   RECENT LABS AND TESTS: BMET    Component Value Date/Time   NA 139 09/01/2017 0942   K 4.6 09/01/2017 0942   CL 103 09/01/2017 0942   CO2 24 09/01/2017 0942   GLUCOSE 98 09/01/2017 0942   GLUCOSE 99 04/01/2017 1357   BUN 13 09/01/2017 0942   CREATININE 0.82 09/01/2017 0942   CREATININE 0.82 08/11/2015 1530   CALCIUM 9.7 09/01/2017 0942   GFRNONAA 84  09/01/2017 0942   GFRNONAA 85 08/11/2015 1530   GFRAA 96 09/01/2017 0942   GFRAA >89 08/11/2015 1530   Lab Results  Component Value Date   HGBA1C 5.8 (H) 09/01/2017   Lab Results  Component Value Date   INSULIN 7.6 09/01/2017   CBC    Component Value Date/Time   WBC 5.7 09/01/2017 0942   WBC 6.0 04/01/2017 1357   RBC 4.82 09/01/2017 0942   RBC 5.02 04/01/2017 1357   HGB 14.2 09/01/2017 0942   HCT 43.8 09/01/2017 0942   PLT 196.0 04/01/2017 1357   MCV 91 09/01/2017 0942   MCH 29.5 09/01/2017 0942   MCH 30.6 02/14/2016 1031   MCH 30.3 08/10/2013 0134   MCHC 32.4 09/01/2017 0942   MCHC 32.7 04/01/2017 1357   RDW 13.7 09/01/2017 0942   LYMPHSABS 1.9 09/01/2017 0942   MONOABS 0.6 08/09/2013 1111   EOSABS 0.1 09/01/2017 0942   BASOSABS 0.0 09/01/2017 0942   Iron/TIBC/Ferritin/ %Sat No results found for: IRON, TIBC, FERRITIN, IRONPCTSAT Lipid Panel     Component Value Date/Time   CHOL 265 (H) 09/01/2017 0942   TRIG 67 09/01/2017 0942   HDL 79 09/01/2017 0942   CHOLHDL 4 06/29/2017 0836   VLDL 12.4 06/29/2017 0836   LDLCALC 173 (H) 09/01/2017 0942   Hepatic Function Panel     Component Value Date/Time   PROT 6.3 09/01/2017 0942   ALBUMIN 3.9 09/01/2017 0942   AST 19 09/01/2017 0942   ALT 19 09/01/2017 0942   ALKPHOS 108 09/01/2017 0942   BILITOT 0.7 09/01/2017 0942      Component Value Date/Time   TSH 0.890 09/01/2017 0942   TSH 0.56 06/29/2017 0836   TSH 5.62 (H) 04/01/2017 1357  Results for KAYTIE, RATCLIFFE (MRN [id number]) as of 09/19/2017 09:58  Ref. Range 09/01/2017 09:42  Vitamin D, 25-Hydroxy Latest Ref Range: 30.0 - 100.0 ng/mL 15.8 (L)    ASSESSMENT AND PLAN: Other hyperlipidemia  Vitamin D deficiency - Plan: Vitamin D, Ergocalciferol, (DRISDOL) 50000 units CAPS capsule  At risk for diabetes mellitus  Class 1 obesity with serious comorbidity and body mass index (BMI) of 30.0 to 30.9 in adult, unspecified obesity  type  PLAN:  Hyperlipidemia Brenda Frost was informed of the American Heart Association Guidelines emphasizing intensive lifestyle modifications as the first line treatment for hyperlipidemia. We discussed many lifestyle modifications today in depth, and Shareese will continue to work on decreasing saturated fats such as fatty red meat, butter and many fried foods. She will also increase vegetables and lean protein in her diet and continue to work on diet, exercise, and weight loss efforts. We will recheck labs in 3 months and Tameah agrees to follow up with our clinic in 2 to 3 weeks.  Vitamin D Deficiency Brenda Frost was informed that low vitamin D levels contributes to fatigue and are associated with obesity, breast, and colon cancer. Brenda Frost agrees to start prescription Vit D @50 ,000 IU every week #4 with  no refill. She will follow up for routine testing of vitamin D, at least 2-3 times per year. She was informed of the risk of over-replacement of vitamin D and agrees to not increase her dose unless she discusses this with [place] first. We will recheck labs in 3 months and Brenda Frost agrees to follow up with our clinic in 2 to 3 weeks.  Diabetes risk counselling Brenda Frost was given extended (30 minutes) diabetes prevention counseling today. She is 52 y.o. female and has risk factors for diabetes including obesity. We discussed intensive lifestyle modifications today with an emphasis on weight loss as well as increasing exercise and decreasing simple carbohydrates in her diet.  Obesity Brenda Frost is currently in the action stage of change. As such, her goal is to continue with weight loss efforts She has agreed to follow the Category 2 plan Brenda Frost has been instructed to work up to a goal of 150 minutes of combined cardio and strengthening exercise per week for weight loss and overall health benefits. We discussed the following Behavioral Modification Strategies today: increasing lean protein intake and no skipping meals   Brenda Frost  has agreed to follow up with our clinic in 2 to 3 weeks. She was informed of the importance of frequent follow up visits to maximize her success with intensive lifestyle modifications for her multiple health conditions.   OBESITY BEHAVIORAL INTERVENTION VISIT  Today's visit was # 2 out of 22.  Starting weight: 183 lbs Starting date: 09/01/17 Today's weight : 181 lbs  Today's date: 09/15/2017 Total lbs lost to date: 2 (Patients must lose 7 lbs in the first 6 months to continue with counseling)   ASK: We discussed the diagnosis of obesity with Brenda Frost today and Brenda Frost agreed to give [place] permission to discuss obesity behavioral modification therapy today.  ASSESS: Brenda Frost has the diagnosis of obesity and her BMI today is 30.12 Brenda Frost is in the action stage of change   ADVISE: Brenda Frost was educated on the multiple health risks of obesity as well as the benefit of weight loss to improve her health. She was advised of the need for long term treatment and the importance of lifestyle modifications.  AGREE: Multiple dietary modification options and treatment options were discussed and  Brenda Frost agreed to the above obesity treatment plan.  I, Trixie Dredge, am acting as transcriptionist for Dennard Nip, MD  I have reviewed the above documentation for accuracy and completeness, and I agree with the above. -Dennard Nip, MD

## 2017-09-25 ENCOUNTER — Other Ambulatory Visit: Payer: Self-pay | Admitting: Family Medicine

## 2017-09-28 ENCOUNTER — Encounter: Payer: Self-pay | Admitting: Family Medicine

## 2017-09-28 ENCOUNTER — Ambulatory Visit: Payer: 59 | Admitting: Family Medicine

## 2017-09-28 VITALS — BP 110/68 | HR 82 | Temp 98.4°F | Ht 65.0 in | Wt 184.4 lb

## 2017-09-28 DIAGNOSIS — Z79890 Hormone replacement therapy (postmenopausal): Secondary | ICD-10-CM | POA: Insufficient documentation

## 2017-09-28 DIAGNOSIS — E039 Hypothyroidism, unspecified: Secondary | ICD-10-CM

## 2017-09-28 DIAGNOSIS — F439 Reaction to severe stress, unspecified: Secondary | ICD-10-CM

## 2017-09-28 DIAGNOSIS — E8881 Metabolic syndrome: Secondary | ICD-10-CM

## 2017-09-28 DIAGNOSIS — Z1211 Encounter for screening for malignant neoplasm of colon: Secondary | ICD-10-CM

## 2017-09-28 DIAGNOSIS — E7800 Pure hypercholesterolemia, unspecified: Secondary | ICD-10-CM | POA: Diagnosis not present

## 2017-09-28 DIAGNOSIS — E559 Vitamin D deficiency, unspecified: Secondary | ICD-10-CM | POA: Insufficient documentation

## 2017-09-28 DIAGNOSIS — E88819 Insulin resistance, unspecified: Secondary | ICD-10-CM

## 2017-09-28 NOTE — Progress Notes (Signed)
Brenda Frost is a 52 y.o. female is here for follow up.  History of Present Illness:   HPI: Labs reviewed with patient. Chart updated today.  Much discussion tabled today due to her daughter's recent diagnosis of lymphoma. Severely stressful to her family. Daughter currently undergoing treatment. Up many nights to help daughter with her pain. Crying spells. Does not like medication.   Health Maintenance Due  Topic Date Due  . COLON CANCER SCREENING ANNUAL FOBT  09/03/2016  . COLONOSCOPY  09/03/2016   Depression screen [facility] 2/9 09/28/2017 09/01/2017 06/01/2017  Decreased Interest 0 3 0  Down, Depressed, Hopeless 0 3 0  PHQ - 2 Score 0 6 0  Altered sleeping 1 1 -  Tired, decreased energy 1 2 -  Change in appetite 1 2 -  Feeling bad or failure about yourself  0 2 -  Trouble concentrating 0 3 -  Moving slowly or fidgety/restless 0 0 -  Suicidal thoughts 0 0 -  PHQ-9 Score 3 16 -  Difficult doing work/chores Not difficult at all Not difficult at all -   PMHx, SurgHx, SocialHx, FamHx, Medications, and Allergies were reviewed in the Visit Navigator and updated as appropriate.   Patient Active Problem List   Diagnosis Date Noted  . Insulin resistance 09/28/2017  . Vitamin D deficiency 09/28/2017  . Hormone replacement therapy (HRT) 09/28/2017  . Nonspecific (abnormal) findings on radiological and other examination of biliary tract 08/09/2013   Social History   Tobacco Use  . Smoking status: Never Smoker  . Smokeless tobacco: Never Used  Substance Use Topics  . Alcohol use: Yes    Alcohol/week: 1.2 oz    Types: 2 Cans of beer per week  . Drug use: No   Current Medications and Allergies:   Current Outpatient Medications:  .  levothyroxine (SYNTHROID, LEVOTHROID) 88 MCG tablet, Take 1 tablet (88 mcg total) by mouth daily., Disp: 90 tablet, Rfl: 3 .  Vitamin D, Ergocalciferol, (DRISDOL) 50000 units CAPS capsule, Take 1 capsule (50,000 Units total) by mouth every 7 (seven)  days., Disp: 4 capsule, Rfl: 0  No Known Allergies Review of Systems   Pertinent items are noted in the HPI. Otherwise, ROS is negative.  Vitals:   Vitals:   09/28/17 0814  BP: 110/68  Pulse: 82  Temp: 98.4 F (36.9 C)  TempSrc: Oral  SpO2: 97%  Weight: 184 lb 6.4 oz (83.6 kg)  Height: 5\' 5"  (1.651 m)     Body mass index is 30.69 kg/m.  Physical Exam:   Physical Exam  Constitutional: She appears well-nourished.  HENT:  Head: Normocephalic and atraumatic.  Eyes: Pupils are equal, round, and reactive to light. EOM are normal.  Neck: Normal range of motion. Neck supple.  Cardiovascular: Normal rate, regular rhythm, normal heart sounds and intact distal pulses.  Pulmonary/Chest: Effort normal.  Abdominal: Soft.  Skin: Skin is warm.  Psychiatric: She has a normal mood and affect. Her behavior is normal.  Nursing note and vitals reviewed.   Assessment and Plan:   Brenda Frost was seen today for follow-up.  Diagnoses and all orders for this visit:  Situational stress Comments: Offered medication, counseling. She declined today. She will increase exercise and practive breathing techniques.   Pure hypercholesterolemia  Insulin resistance Comments: Reassured patient that Metformin is a good medication if she'd like to try it in the future.   Acquired hypothyroidism  Vitamin D deficiency  Hormone replacement therapy (HRT)  Screening for colon cancer    .  Reviewed expectations re: course of current medical issues. . Discussed self-management of symptoms. . Outlined signs and symptoms indicating need for more acute intervention. . Patient verbalized understanding and all questions were answered. Marland Kitchen Health Maintenance issues including appropriate healthy diet, exercise, and smoking avoidance were discussed with patient. . See orders for this visit as documented in the electronic medical record. . Patient received an After Visit Summary.  Brenda Deutscher, DO ,  [place] 09/28/2017  Future Appointments  Date Time Provider [place]  10/10/2017  4:40 PM Starlyn Skeans, MD [facility] None

## 2017-10-04 ENCOUNTER — Other Ambulatory Visit: Payer: 59

## 2017-10-05 ENCOUNTER — Other Ambulatory Visit (INDEPENDENT_AMBULATORY_CARE_PROVIDER_SITE_OTHER): Payer: 59

## 2017-10-05 DIAGNOSIS — Z1211 Encounter for screening for malignant neoplasm of colon: Secondary | ICD-10-CM | POA: Diagnosis not present

## 2017-10-05 LAB — FECAL OCCULT BLOOD, IMMUNOCHEMICAL: Fecal Occult Bld: NEGATIVE

## 2017-10-10 ENCOUNTER — Ambulatory Visit (INDEPENDENT_AMBULATORY_CARE_PROVIDER_SITE_OTHER): Payer: 59 | Admitting: Family Medicine

## 2017-10-10 VITALS — BP 101/68 | HR 73 | Temp 97.9°F | Ht 65.0 in | Wt 177.0 lb

## 2017-10-10 DIAGNOSIS — E559 Vitamin D deficiency, unspecified: Secondary | ICD-10-CM

## 2017-10-10 DIAGNOSIS — R5383 Other fatigue: Secondary | ICD-10-CM | POA: Diagnosis not present

## 2017-10-10 DIAGNOSIS — Z6830 Body mass index (BMI) 30.0-30.9, adult: Secondary | ICD-10-CM

## 2017-10-10 DIAGNOSIS — Z9189 Other specified personal risk factors, not elsewhere classified: Secondary | ICD-10-CM

## 2017-10-10 DIAGNOSIS — E669 Obesity, unspecified: Secondary | ICD-10-CM

## 2017-10-10 MED ORDER — ESCITALOPRAM OXALATE 5 MG PO TABS: 5.0000 mg | ORAL_TABLET | Freq: Every day | ORAL | 0 refills | Status: DC

## 2017-10-10 MED ORDER — VITAMIN D (ERGOCALCIFEROL) 1.25 MG (50000 UNIT) PO CAPS: 50000.0000 [IU] | ORAL_CAPSULE | ORAL | 0 refills | Status: DC

## 2017-10-11 MED FILL — VIT D2 1.25 MG (50,000 UNIT: 1.25 MG | 28 days supply | Qty: 4 | Fill #0

## 2017-10-11 MED FILL — ESCITALOPRAM 5 MG TABLET: 5 | 30 days supply | Qty: 30 | Fill #0

## 2017-10-11 NOTE — Progress Notes (Signed)
Office: [phone]  /  Fax: [phone]   HPI:   Chief Complaint: OBESITY Sherae is here to discuss her progress with her obesity treatment plan. She is on the Category 2 plan and is following her eating plan approximately 75 % of the time. She states she is exercising 0 minutes 0 times per week. Aniza continues to do well with weight loss but partly due to decreased appetite and missing all her meals with daughters chemotherapy and almost daily visits due to complications.  Her weight is 177 lb (80.3 kg) today and has had a weight loss of 4 pounds over a period of 3 to 4 weeks since her last visit. She has lost 6 lbs since starting treatment with [place].  Vitamin D Deficiency Faithlynn has a diagnosis of vitamin D deficiency. She is stable on prescription Vit D, not yet at goal. She denies nausea, vomiting or muscle weakness.  At risk for osteopenia and osteoporosis Aviya is at higher risk of osteopenia and osteoporosis due to vitamin D deficiency.   Caregiver with Fatigue Bradee's daughter is undergoing chemotherapy and Teniola has had increased stress and decreased sleep.  ALLERGIES: No Known Allergies  MEDICATIONS: Current Outpatient Medications on File Prior to Visit  Medication Sig Dispense Refill  . levothyroxine (SYNTHROID, LEVOTHROID) 88 MCG tablet Take 1 tablet (88 mcg total) by mouth daily. 90 tablet 3   No current facility-administered medications on file prior to visit.     PAST MEDICAL HISTORY: Past Medical History:  Diagnosis Date  . Hyperlipidemia   . Hypothyroidism     PAST SURGICAL HISTORY: Past Surgical History:  Procedure Laterality Date  . CESAREAN SECTION    . ORIF ELBOW FRACTURE Right 02/15/2016   Procedure: OPEN REDUCTION INTERNAL FIXATION (ORIF) ELBOW/OLECRANON FRACTURE;  Surgeon: Iran Planas, MD;  Location: [place];  Service: Orthopedics;  Laterality: Right;    SOCIAL HISTORY: Social History   Tobacco Use  . Smoking status: Never Smoker  .  Smokeless tobacco: Never Used  Substance Use Topics  . Alcohol use: Yes    Alcohol/week: 1.2 oz    Types: 2 Cans of beer per week  . Drug use: No    FAMILY HISTORY: Family History  Problem Relation Age of Onset  . Diabetes Mother   . Hyperlipidemia Mother   . Hypertension Mother   . Kidney disease Mother   . Heart disease Maternal Grandmother   . CAD Neg Hx   . Breast cancer Neg Hx     ROS: Review of Systems  Constitutional: Positive for malaise/fatigue and weight loss.  Gastrointestinal: Negative for nausea and vomiting.  Musculoskeletal:       Negative muscle weakness    PHYSICAL EXAM: Blood pressure 101/68, pulse 73, temperature 97.9 F (36.6 C), temperature source Oral, height 5\' 5"  (1.651 m), weight 177 lb (80.3 kg), last menstrual period 04/25/2016, SpO2 100 %. Body mass index is 29.45 kg/m. Physical Exam  Constitutional: She is oriented to person, place, and time. She appears well-developed and well-nourished.  Cardiovascular: Normal rate.  Pulmonary/Chest: Effort normal.  Musculoskeletal: Normal range of motion.  Neurological: She is oriented to person, place, and time.  Skin: Skin is warm and dry.  Psychiatric: She has a normal mood and affect. Her behavior is normal.  Vitals reviewed.   RECENT LABS AND TESTS: BMET    Component Value Date/Time   NA 139 09/01/2017 0942   K 4.6 09/01/2017 0942   CL 103 09/01/2017 0942   CO2  24 09/01/2017 0942   GLUCOSE 98 09/01/2017 0942   GLUCOSE 99 04/01/2017 1357   BUN 13 09/01/2017 0942   CREATININE 0.82 09/01/2017 0942   CREATININE 0.82 08/11/2015 1530   CALCIUM 9.7 09/01/2017 0942   GFRNONAA 84 09/01/2017 0942   GFRNONAA 85 08/11/2015 1530   GFRAA 96 09/01/2017 0942   GFRAA >89 08/11/2015 1530   Lab Results  Component Value Date   HGBA1C 5.8 (H) 09/01/2017   Lab Results  Component Value Date   INSULIN 7.6 09/01/2017   CBC    Component Value Date/Time   WBC 5.7 09/01/2017 0942   WBC 6.0 04/01/2017  1357   RBC 4.82 09/01/2017 0942   RBC 5.02 04/01/2017 1357   HGB 14.2 09/01/2017 0942   HCT 43.8 09/01/2017 0942   PLT 196.0 04/01/2017 1357   MCV 91 09/01/2017 0942   MCH 29.5 09/01/2017 0942   MCH 30.6 02/14/2016 1031   MCH 30.3 08/10/2013 0134   MCHC 32.4 09/01/2017 0942   MCHC 32.7 04/01/2017 1357   RDW 13.7 09/01/2017 0942   LYMPHSABS 1.9 09/01/2017 0942   MONOABS 0.6 08/09/2013 1111   EOSABS 0.1 09/01/2017 0942   BASOSABS 0.0 09/01/2017 0942   Iron/TIBC/Ferritin/ %Sat No results found for: IRON, TIBC, FERRITIN, IRONPCTSAT Lipid Panel     Component Value Date/Time   CHOL 265 (H) 09/01/2017 0942   TRIG 67 09/01/2017 0942   HDL 79 09/01/2017 0942   CHOLHDL 4 06/29/2017 0836   VLDL 12.4 06/29/2017 0836   LDLCALC 173 (H) 09/01/2017 0942   Hepatic Function Panel     Component Value Date/Time   PROT 6.3 09/01/2017 0942   ALBUMIN 3.9 09/01/2017 0942   AST 19 09/01/2017 0942   ALT 19 09/01/2017 0942   ALKPHOS 108 09/01/2017 0942   BILITOT 0.7 09/01/2017 0942      Component Value Date/Time   TSH 0.890 09/01/2017 0942   TSH 0.56 06/29/2017 0836   TSH 5.62 (H) 04/01/2017 1357  Results for JENEL, GIERKE (MRN [id number]) as of 10/11/2017 13:22  Ref. Range 09/01/2017 09:42  Vitamin D, 25-Hydroxy Latest Ref Range: 30.0 - 100.0 ng/mL 15.8 (L)    ASSESSMENT AND PLAN: Vitamin D deficiency - Plan: Vitamin D, Ergocalciferol, (DRISDOL) 50000 units CAPS capsule  Caregiver with fatigue - Plan: escitalopram (LEXAPRO) 5 MG tablet  At risk for osteoporosis  Class 1 obesity with serious comorbidity and body mass index (BMI) of 30.0 to 30.9 in adult, unspecified obesity type - Starting BMI greater then 30  PLAN:  Vitamin D Deficiency Aneyah was informed that low vitamin D levels contributes to fatigue and are associated with obesity, breast, and colon cancer. Margaurite agrees to continue taking prescription Vit D @50 ,000 IU every week #4 and we will refill for 1 month. She will  follow up for routine testing of vitamin D, at least 2-3 times per year. She was informed of the risk of over-replacement of vitamin D and agrees to not increase her dose unless she discusses this with [place] first. Sara agrees to follow up with our clinic in 4 weeks.  At risk for osteopenia and osteoporosis Telly is at risk for osteopenia and osteoporsis due to her vitamin D deficiency. She was encouraged to take her vitamin D and follow her higher calcium diet and increase strengthening exercise to help strengthen her bones and decrease her risk of osteopenia and osteoporosis.  Caregiver with Fatigue Petrice agrees to start Lexapro 5 mg qd #30 with no  refills and she agrees to follow up with our clinic in 4 weeks.  Obesity Jaelah is currently in the action stage of change. As such, her goal is to continue with weight loss efforts She has agreed to follow the Category 2 plan Lennox has been instructed to work up to a goal of 150 minutes of combined cardio and strengthening exercise per week for weight loss and overall health benefits. We discussed the following Behavioral Modification Strategies today: increasing lean protein intake, increasing vegetables, emotional eating strategies, and no skipping meals Quinesha is to try to remember to take care of herself too. We discussed ways to supplement when she misses meals.  Thy has agreed to follow up with our clinic in 4 weeks. She was informed of the importance of frequent follow up visits to maximize her success with intensive lifestyle modifications for her multiple health conditions.   OBESITY BEHAVIORAL INTERVENTION VISIT  Today's visit was # 3 out of 22.  Starting weight: 183 lbs Starting date: 09/01/17 Today's weight : 177 lbs  Today's date: 10/10/2017 Total lbs lost to date: 6    ASK: We discussed the diagnosis of obesity with Merril Abbe today and Wendelin agreed to give [place] permission to discuss obesity behavioral modification therapy  today.  ASSESS: Adrianna has the diagnosis of obesity and her BMI today is 29.45 Jirah is in the action stage of change   ADVISE: Elaiza was educated on the multiple health risks of obesity as well as the benefit of weight loss to improve her health. She was advised of the need for long term treatment and the importance of lifestyle modifications.  AGREE: Multiple dietary modification options and treatment options were discussed and  Lenise agreed to the above obesity treatment plan.  I, Trixie Dredge, am acting as transcriptionist for Dennard Nip, MD  I have reviewed the above documentation for accuracy and completeness, and I agree with the above. -Dennard Nip, MD

## 2017-10-25 MED FILL — LEVOTHYROXINE 88 MCG TABLET: 88 | 90 days supply | Qty: 90 | Fill #2

## 2017-11-07 ENCOUNTER — Ambulatory Visit (INDEPENDENT_AMBULATORY_CARE_PROVIDER_SITE_OTHER): Payer: Self-pay | Admitting: Family Medicine

## 2017-11-07 ENCOUNTER — Encounter (INDEPENDENT_AMBULATORY_CARE_PROVIDER_SITE_OTHER): Payer: Self-pay

## 2017-11-23 ENCOUNTER — Ambulatory Visit (INDEPENDENT_AMBULATORY_CARE_PROVIDER_SITE_OTHER): Payer: 59 | Admitting: Family Medicine

## 2017-11-23 VITALS — BP 113/76 | HR 81 | Temp 98.1°F | Ht 65.0 in | Wt 178.0 lb

## 2017-11-23 DIAGNOSIS — E559 Vitamin D deficiency, unspecified: Secondary | ICD-10-CM | POA: Diagnosis not present

## 2017-11-23 DIAGNOSIS — E669 Obesity, unspecified: Secondary | ICD-10-CM | POA: Diagnosis not present

## 2017-11-23 DIAGNOSIS — Z6830 Body mass index (BMI) 30.0-30.9, adult: Secondary | ICD-10-CM | POA: Diagnosis not present

## 2017-11-23 DIAGNOSIS — Z9189 Other specified personal risk factors, not elsewhere classified: Secondary | ICD-10-CM

## 2017-11-23 MED ORDER — VITAMIN D (ERGOCALCIFEROL) 1.25 MG (50000 UNIT) PO CAPS: 50000.0000 [IU] | ORAL_CAPSULE | ORAL | 0 refills | Status: DC

## 2017-11-23 MED FILL — VIT D2 1.25 MG (50,000 UNIT: 1.25 MG | 28 days supply | Qty: 4 | Fill #0

## 2017-11-24 NOTE — Progress Notes (Signed)
Office: [phone]  /  Fax: [phone]   HPI:   Chief Complaint: OBESITY Brenda Frost is here to discuss her progress with her obesity treatment plan. She is on the Category 2 plan and is following her eating plan approximately 50 % of the time. She states she is exercising 0 minutes 0 times per week. Brenda Frost has struggled with meal planning while her daughter is undergoing chemotherapy with complications. She wants to take care of her health but she is stressed with everything she has to do for her family and her job.  Her weight is 178 lb (80.7 kg) today and has gained 1 pound since her last visit. She has lost 5 lbs since starting treatment with [place].  Vitamin D Deficiency Brenda Frost has a diagnosis of vitamin D deficiency. She is stable on prescription Vit D, but last level was not at goal. She denies nausea, vomiting or muscle weakness.  At risk for diabetes Brenda Frost is at higher than average risk for developing diabetes due to her obesity. She currently denies polyuria or polydipsia.  ALLERGIES: No Known Allergies  MEDICATIONS: Current Outpatient Medications on File Prior to Visit  Medication Sig Dispense Refill  . escitalopram (LEXAPRO) 5 MG tablet Take 1 tablet (5 mg total) by mouth daily. 30 tablet 0  . levothyroxine (SYNTHROID, LEVOTHROID) 88 MCG tablet Take 1 tablet (88 mcg total) by mouth daily. 90 tablet 3   No current facility-administered medications on file prior to visit.     PAST MEDICAL HISTORY: Past Medical History:  Diagnosis Date  . Hyperlipidemia   . Hypothyroidism     PAST SURGICAL HISTORY: Past Surgical History:  Procedure Laterality Date  . CESAREAN SECTION    . ORIF ELBOW FRACTURE Right 02/15/2016   Procedure: OPEN REDUCTION INTERNAL FIXATION (ORIF) ELBOW/OLECRANON FRACTURE;  Surgeon: Iran Planas, MD;  Location: [place];  Service: Orthopedics;  Laterality: Right;    SOCIAL HISTORY: Social History   Tobacco Use  . Smoking status: Never Smoker  . Smokeless  tobacco: Never Used  Substance Use Topics  . Alcohol use: Yes    Alcohol/week: 2.0 standard drinks    Types: 2 Cans of beer per week  . Drug use: No    FAMILY HISTORY: Family History  Problem Relation Age of Onset  . Diabetes Mother   . Hyperlipidemia Mother   . Hypertension Mother   . Kidney disease Mother   . Heart disease Maternal Grandmother   . CAD Neg Hx   . Breast cancer Neg Hx     ROS: Review of Systems  Constitutional: Negative for weight loss.  Gastrointestinal: Negative for nausea and vomiting.  Genitourinary: Negative for frequency.  Musculoskeletal:       Negative muscle weakness  Endo/Heme/Allergies: Negative for polydipsia.    PHYSICAL EXAM: Blood pressure 113/76, pulse 81, temperature 98.1 F (36.7 C), temperature source Oral, height 5\' 5"  (1.651 m), weight 178 lb (80.7 kg), last menstrual period 04/25/2016, SpO2 98 %. Body mass index is 29.62 kg/m. Physical Exam  Constitutional: She is oriented to person, place, and time. She appears well-developed and well-nourished.  Cardiovascular: Normal rate.  Pulmonary/Chest: Effort normal.  Musculoskeletal: Normal range of motion.  Neurological: She is oriented to person, place, and time.  Skin: Skin is warm and dry.  Psychiatric: She has a normal mood and affect. Her behavior is normal.  Vitals reviewed.   RECENT LABS AND TESTS: BMET    Component Value Date/Time   NA 139 09/01/2017 0942  K 4.6 09/01/2017 0942   CL 103 09/01/2017 0942   CO2 24 09/01/2017 0942   GLUCOSE 98 09/01/2017 0942   GLUCOSE 99 04/01/2017 1357   BUN 13 09/01/2017 0942   CREATININE 0.82 09/01/2017 0942   CREATININE 0.82 08/11/2015 1530   CALCIUM 9.7 09/01/2017 0942   GFRNONAA 84 09/01/2017 0942   GFRNONAA 85 08/11/2015 1530   GFRAA 96 09/01/2017 0942   GFRAA >89 08/11/2015 1530   Lab Results  Component Value Date   HGBA1C 5.8 (H) 09/01/2017   Lab Results  Component Value Date   INSULIN 7.6 09/01/2017   CBC      Component Value Date/Time   WBC 5.7 09/01/2017 0942   WBC 6.0 04/01/2017 1357   RBC 4.82 09/01/2017 0942   RBC 5.02 04/01/2017 1357   HGB 14.2 09/01/2017 0942   HCT 43.8 09/01/2017 0942   PLT 196.0 04/01/2017 1357   MCV 91 09/01/2017 0942   MCH 29.5 09/01/2017 0942   MCH 30.6 02/14/2016 1031   MCH 30.3 08/10/2013 0134   MCHC 32.4 09/01/2017 0942   MCHC 32.7 04/01/2017 1357   RDW 13.7 09/01/2017 0942   LYMPHSABS 1.9 09/01/2017 0942   MONOABS 0.6 08/09/2013 1111   EOSABS 0.1 09/01/2017 0942   BASOSABS 0.0 09/01/2017 0942   Iron/TIBC/Ferritin/ %Sat No results found for: IRON, TIBC, FERRITIN, IRONPCTSAT Lipid Panel     Component Value Date/Time   CHOL 265 (H) 09/01/2017 0942   TRIG 67 09/01/2017 0942   HDL 79 09/01/2017 0942   CHOLHDL 4 06/29/2017 0836   VLDL 12.4 06/29/2017 0836   LDLCALC 173 (H) 09/01/2017 0942   Hepatic Function Panel     Component Value Date/Time   PROT 6.3 09/01/2017 0942   ALBUMIN 3.9 09/01/2017 0942   AST 19 09/01/2017 0942   ALT 19 09/01/2017 0942   ALKPHOS 108 09/01/2017 0942   BILITOT 0.7 09/01/2017 0942      Component Value Date/Time   TSH 0.890 09/01/2017 0942   TSH 0.56 06/29/2017 0836   TSH 5.62 (H) 04/01/2017 1357  Results for DESHUNDA, THACKSTON (MRN [id number]) as of 11/24/2017 10:06  Ref. Range 09/01/2017 09:42  Vitamin D, 25-Hydroxy Latest Ref Range: 30.0 - 100.0 ng/mL 15.8 (L)    ASSESSMENT AND PLAN: Vitamin D deficiency - Plan: Vitamin D, Ergocalciferol, (DRISDOL) 50000 units CAPS capsule  At risk for diabetes mellitus  Class 1 obesity with serious comorbidity and body mass index (BMI) of 30.0 to 30.9 in adult, unspecified obesity type - Starting BMI greater then 30  PLAN:  Vitamin D Deficiency Brenda Frost was informed that low vitamin D levels contributes to fatigue and are associated with obesity, breast, and colon cancer. Brenda Frost agrees to continue taking prescription Vit D @50 ,000 IU every week #4 and we will refill for 1 month.  She will follow up for routine testing of vitamin D, at least 2-3 times per year. She was informed of the risk of over-replacement of vitamin D and agrees to not increase her dose unless she discusses this with [place] first. Brenda Frost agrees to follow up with our clinic in 2 to 3 weeks.  Diabetes risk counselling Brenda Frost was given extended (15 minutes) diabetes prevention counseling today. She is 52 y.o. female and has risk factors for diabetes including obesity. We discussed intensive lifestyle modifications today with an emphasis on weight loss as well as increasing exercise and decreasing simple carbohydrates in her diet.  Obesity Brenda Frost is currently in the action stage of  change. As such, her goal is to continue with weight loss efforts She has agreed to continue to follow the Category 2 plan Brenda Frost has been instructed to work up to a goal of 150 minutes of combined cardio and strengthening exercise per week for weight loss and overall health benefits. We discussed the following Behavioral Modification Strategies today: work on meal planning and easy cooking plans, dealing with family or coworker sabotage, and better snacking choices   Brenda Frost has agreed to follow up with our clinic in 2 to 3 weeks. She was informed of the importance of frequent follow up visits to maximize her success with intensive lifestyle modifications for her multiple health conditions.   OBESITY BEHAVIORAL INTERVENTION VISIT  Today's visit was # 4   Starting weight: 183 lbs Starting date: 09/01/17 Today's weight : 178 lbs Today's date: 11/23/2017 Total lbs lost to date: 5    ASK: We discussed the diagnosis of obesity with Brenda Frost today and Brenda Frost agreed to give [place] permission to discuss obesity behavioral modification therapy today.  ASSESS: Brenda Frost has the diagnosis of obesity and her BMI today is 29.62 Brenda Frost is in the action stage of change   ADVISE: Brenda Frost was educated on the multiple health risks of obesity as  well as the benefit of weight loss to improve her health. She was advised of the need for long term treatment and the importance of lifestyle modifications to improve her current health and to decrease her risk of future health problems.  AGREE: Multiple dietary modification options and treatment options were discussed and  Brenda Frost agreed to follow the recommendations documented in the above note.  ARRANGE: Brenda Frost was educated on the importance of frequent visits to treat obesity as outlined per CMS and USPSTF guidelines and agreed to schedule her next follow up appointment today.  I, Trixie Dredge, am acting as transcriptionist for Dennard Nip, MD  I have reviewed the above documentation for accuracy and completeness, and I agree with the above. -Dennard Nip, MD

## 2017-12-08 ENCOUNTER — Ambulatory Visit (INDEPENDENT_AMBULATORY_CARE_PROVIDER_SITE_OTHER): Payer: 59 | Admitting: Family Medicine

## 2017-12-08 VITALS — BP 106/71 | HR 63 | Temp 97.7°F | Ht 65.0 in | Wt 179.0 lb

## 2017-12-08 DIAGNOSIS — E66811 Obesity, class 1: Secondary | ICD-10-CM

## 2017-12-08 DIAGNOSIS — Z9189 Other specified personal risk factors, not elsewhere classified: Secondary | ICD-10-CM | POA: Diagnosis not present

## 2017-12-08 DIAGNOSIS — E669 Obesity, unspecified: Secondary | ICD-10-CM

## 2017-12-08 DIAGNOSIS — Z6830 Body mass index (BMI) 30.0-30.9, adult: Secondary | ICD-10-CM | POA: Diagnosis not present

## 2017-12-08 DIAGNOSIS — R7303 Prediabetes: Secondary | ICD-10-CM | POA: Diagnosis not present

## 2017-12-08 DIAGNOSIS — E7849 Other hyperlipidemia: Secondary | ICD-10-CM

## 2017-12-08 DIAGNOSIS — E559 Vitamin D deficiency, unspecified: Secondary | ICD-10-CM

## 2017-12-08 DIAGNOSIS — E038 Other specified hypothyroidism: Secondary | ICD-10-CM | POA: Diagnosis not present

## 2017-12-09 LAB — COMPREHENSIVE METABOLIC PANEL
ALK PHOS: 113 IU/L (ref 39–117)
ALT: 10 IU/L (ref 0–32)
AST: 10 IU/L (ref 0–40)
Albumin/Globulin Ratio: 1.6 (ref 1.2–2.2)
Albumin: 4 g/dL (ref 3.5–5.5)
BUN/Creatinine Ratio: 15 (ref 9–23)
BUN: 13 mg/dL (ref 6–24)
Bilirubin Total: 0.6 mg/dL (ref 0.0–1.2)
CO2: 24 mmol/L (ref 20–29)
CREATININE: 0.88 mg/dL (ref 0.57–1.00)
Calcium: 9.7 mg/dL (ref 8.7–10.2)
Chloride: 104 mmol/L (ref 96–106)
GFR calc Af Amer: 88 mL/min/{1.73_m2} (ref >59)
GFR calc non Af Amer: 76 mL/min/{1.73_m2} (ref >59)
GLUCOSE: 111 mg/dL — AB (ref 65–99)
Globulin, Total: 2.5 g/dL (ref 1.5–4.5)
Potassium: 4.3 mmol/L (ref 3.5–5.2)
Sodium: 142 mmol/L (ref 134–144)
Total Protein: 6.5 g/dL (ref 6.0–8.5)

## 2017-12-09 LAB — INSULIN, RANDOM: INSULIN: 11 u[IU]/mL (ref 2.6–24.9)

## 2017-12-09 LAB — T4, FREE: FREE T4: 1.36 ng/dL (ref 0.82–1.77)

## 2017-12-09 LAB — LIPID PANEL WITH LDL/HDL RATIO
CHOLESTEROL TOTAL: 293 mg/dL — AB (ref 100–199)
HDL: 86 mg/dL (ref >39)
LDL Calculated: 192 mg/dL — ABNORMAL HIGH (ref 0–99)
LDL/HDL RATIO: 2.2 ratio (ref 0.0–3.2)
TRIGLYCERIDES: 76 mg/dL (ref 0–149)
VLDL Cholesterol Cal: 15 mg/dL (ref 5–40)

## 2017-12-09 LAB — VITAMIN D 25 HYDROXY (VIT D DEFICIENCY, FRACTURES): VIT D 25 HYDROXY: 27.5 ng/mL — AB (ref 30.0–100.0)

## 2017-12-09 LAB — T3: T3, Total: 74 ng/dL (ref 71–180)

## 2017-12-09 LAB — TSH: TSH: 1.71 u[IU]/mL (ref 0.450–4.500)

## 2017-12-09 LAB — HEMOGLOBIN A1C
ESTIMATED AVERAGE GLUCOSE: 128 mg/dL
HEMOGLOBIN A1C: 6.1 % — AB (ref 4.8–5.6)

## 2017-12-12 NOTE — Progress Notes (Signed)
Office: [phone]  /  Fax: [phone]   HPI:   Chief Complaint: OBESITY Brenda Frost is here to discuss her progress with her obesity treatment plan. She is on the  follow the Category 2 plan and is following her eating plan approximately 80 % of the time. She states she is exercising 0 minutes 0 times per week. Brenda Frost is currently struggling with following her Cat 2 plan. She has recently had an increase with work stress, she notes skipping meals and struggling to eat all her protein and vegetables.  Her weight is 179 lb (81.2 kg) today and has gained 1 lb since her last visit. She has lost 5 lbs since starting treatment with [place].  Vitamin D deficiency Brenda Frost has a diagnosis of vitamin D deficiency. She is currently taking vit D and denies nausea, vomiting or muscle weakness.  Pre-Diabetes Brenda Frost has a diagnosis of prediabetes based on her elevated HgA1c and was informed this puts her at greater risk of developing diabetes. She is not taking metformin currently and continues to work on diet and exercise to decrease risk of diabetes. She denies nausea or hypoglycemia.  Hyperlipidemia Brenda Frost has hyperlipidemia and has been trying to improve her cholesterol levels with intensive lifestyle modification including a low saturated fat diet, exercise and weight loss. She denies any chest pain, claudication or myalgias.  Hypothyroidism Brenda Frost [name] is a 52 y.o. female who presents for follow up of hypothyroidism. Current symptoms: none . Patient denies heat / cold intolerance and palpitations. Symptoms have been well-controlled.  At risk for diabetes Brenda Frost is at higher than averagerisk for developing diabetes due to her obesity. She currently denies polyuria or polydipsia.   ALLERGIES: No Known Allergies  MEDICATIONS: Current Outpatient Medications on File Prior to Visit  Medication Sig Dispense Refill  . levothyroxine (SYNTHROID, LEVOTHROID) 88 MCG tablet Take 1 tablet (88 mcg total) by  mouth daily. 90 tablet 3  . Vitamin D, Ergocalciferol, (DRISDOL) 50000 units CAPS capsule Take 1 capsule (50,000 Units total) by mouth every 7 (seven) days. 4 capsule 0   No current facility-administered medications on file prior to visit.     PAST MEDICAL HISTORY: Past Medical History:  Diagnosis Date  . Hyperlipidemia   . Hypothyroidism     PAST SURGICAL HISTORY: Past Surgical History:  Procedure Laterality Date  . CESAREAN SECTION    . ORIF ELBOW FRACTURE Right 02/15/2016   Procedure: OPEN REDUCTION INTERNAL FIXATION (ORIF) ELBOW/OLECRANON FRACTURE;  Surgeon: Iran Planas, MD;  Location: [place] [facility];  Service: Orthopedics;  Laterality: Right;    SOCIAL HISTORY: Social History   Tobacco Use  . Smoking status: Never Smoker  . Smokeless tobacco: Never Used  Substance Use Topics  . Alcohol use: Yes    Alcohol/week: 2.0 standard drinks    Types: 2 Cans of beer per week  . Drug use: No    FAMILY HISTORY: Family History  Problem Relation Age of Onset  . Diabetes Mother   . Hyperlipidemia Mother   . Hypertension Mother   . Kidney disease Mother   . Heart disease Maternal Grandmother   . CAD Neg Hx   . Breast cancer Neg Hx     ROS: Review of Systems  Constitutional: Negative for weight loss.  All other systems reviewed and are negative.   PHYSICAL EXAM: Blood pressure 106/71, pulse 63, temperature 97.7 F (36.5 C), temperature source Oral, height 5\' 5"  (1.651 m), weight 179 lb (81.2 kg), last menstrual period 04/25/2016, SpO2 100 %.  Body mass index is 29.79 kg/m. Physical Exam  Constitutional: She appears well-developed and well-nourished.  HENT:  Head: Normocephalic.  Eyes: EOM are normal.  Neck: Normal range of motion.  Pulmonary/Chest: Effort normal.  Skin: Skin is warm and dry.  Psychiatric: She has a normal mood and affect. Her behavior is normal.  Vitals reviewed.   RECENT LABS AND TESTS: BMET    Component Value Date/Time   NA 142 12/08/2017 0820    K 4.3 12/08/2017 0820   CL 104 12/08/2017 0820   CO2 24 12/08/2017 0820   GLUCOSE 111 (H) 12/08/2017 0820   GLUCOSE 99 04/01/2017 1357   BUN 13 12/08/2017 0820   CREATININE 0.88 12/08/2017 0820   CREATININE 0.82 08/11/2015 1530   CALCIUM 9.7 12/08/2017 0820   GFRNONAA 76 12/08/2017 0820   GFRNONAA 85 08/11/2015 1530   GFRAA 88 12/08/2017 0820   GFRAA >89 08/11/2015 1530   Lab Results  Component Value Date   HGBA1C 6.1 (H) 12/08/2017   HGBA1C 5.8 (H) 09/01/2017   Lab Results  Component Value Date   INSULIN 11.0 12/08/2017   INSULIN 7.6 09/01/2017   CBC    Component Value Date/Time   WBC 5.7 09/01/2017 0942   WBC 6.0 04/01/2017 1357   RBC 4.82 09/01/2017 0942   RBC 5.02 04/01/2017 1357   HGB 14.2 09/01/2017 0942   HCT 43.8 09/01/2017 0942   PLT 196.0 04/01/2017 1357   MCV 91 09/01/2017 0942   MCH 29.5 09/01/2017 0942   MCH 30.6 02/14/2016 1031   MCH 30.3 08/10/2013 0134   MCHC 32.4 09/01/2017 0942   MCHC 32.7 04/01/2017 1357   RDW 13.7 09/01/2017 0942   LYMPHSABS 1.9 09/01/2017 0942   MONOABS 0.6 08/09/2013 1111   EOSABS 0.1 09/01/2017 0942   BASOSABS 0.0 09/01/2017 0942   Iron/TIBC/Ferritin/ %Sat No results found for: IRON, TIBC, FERRITIN, IRONPCTSAT Lipid Panel     Component Value Date/Time   CHOL 293 (H) 12/08/2017 0820   TRIG 76 12/08/2017 0820   HDL 86 12/08/2017 0820   CHOLHDL 4 06/29/2017 0836   VLDL 12.4 06/29/2017 0836   LDLCALC 192 (H) 12/08/2017 0820   Hepatic Function Panel     Component Value Date/Time   PROT 6.5 12/08/2017 0820   ALBUMIN 4.0 12/08/2017 0820   AST 10 12/08/2017 0820   ALT 10 12/08/2017 0820   ALKPHOS 113 12/08/2017 0820   BILITOT 0.6 12/08/2017 0820      Component Value Date/Time   TSH 1.710 12/08/2017 0820   TSH 0.890 09/01/2017 0942   TSH 0.56 06/29/2017 0836    ASSESSMENT AND PLAN: Vitamin D deficiency - Plan: VITAMIN D 25 Hydroxy (Vit-D Deficiency, Fractures)  Prediabetes - Plan: Comprehensive metabolic  panel, Hemoglobin A1c, Insulin, random  Other hyperlipidemia - Plan: Lipid Panel With LDL/HDL Ratio  Other specified hypothyroidism - Plan: T3, T4, free, TSH  At risk for diabetes mellitus  Class 1 obesity with serious comorbidity and body mass index (BMI) of 30.0 to 30.9 in adult, unspecified obesity type - Starting BMI greater then 30  PLAN: Vitamin D Deficiency Brenda Frost was informed that low vitamin D levels contributes to fatigue and are associated with obesity, breast, and colon cancer. She agrees to continue to take prescription Vit D @50 ,000 IU every week and will follow up for routine testing of vitamin D, at least 2-3 times per year. She was informed of the risk of over-replacement of vitamin D and agrees to not increase her dose  unless she discusses this with [place] first.  Pre-Diabetes Brenda Frost will continue to work on weight loss, exercise, and decreasing simple carbohydrates in her diet to help decrease the risk of diabetes. We dicussed metformin including benefits and risks. She was informed that eating too many simple carbohydrates or too many calories at one sitting increases the likelihood of GI side effects. Brenda Frost declined metformin for now and a prescription was not written today. Brenda Frost agreed to follow up with [place] as directed to monitor her progress.  Hyperlipidemia Brenda Frost was informed of the American Heart Association Guidelines emphasizing intensive lifestyle modifications as the first line treatment for hyperlipidemia. We discussed many lifestyle modifications today in depth, and Brenda Frost will continue to work on decreasing saturated fats such as fatty red meat, butter and many fried foods. She will also increase vegetables and lean protein in her diet and continue to work on exercise and weight loss efforts.  Hypothyroid Brenda Frost was informed of the importance of good thyroid control to help with weight loss efforts. She was also informed that supertheraputic thyroid levels are dangerous  and will not improve weight loss results.   Diabetes risk counselling Brenda Frost was given extended (15 minutes) diabetes prevention counseling today. She is 52 y.o. female and has risk factors for diabetes including obesity. We discussed intensive lifestyle modifications today with an emphasis on weight loss as well as increasing exercise and decreasing simple carbohydrates in her diet.     Brenda Frost is currently in the action stage of change. As such, her goal is to continue with weight loss efforts She has agreed to keep a food journal with 1200 calories and 75 protein  Brenda Frost has been instructed to work up to a goal of 150 minutes of combined cardio and strengthening exercise per week for weight loss and overall health benefits. We discussed the following Behavioral Modification Stratagies today: increasing lean protein intake, decreasing simple carbohydrates  and work on meal planning, easy cooking plans and keep a strict food journal.  Brenda Frost has agreed to follow up with our clinic in 3 weeks. She was informed of the importance of frequent follow up visits to maximize her success with intensive lifestyle modifications for her multiple health conditions.   OBESITY BEHAVIORAL INTERVENTION VISIT  Today's visit was # 5   Starting weight: 183 lbs Starting date: 09/01/17 Today's weight : Weight: 179 lb (81.2 kg)  Today's date: 12/08/17 Total lbs lost to date: 5    ASK: We discussed the diagnosis of obesity with Brenda Frost today and Brenda Frost agreed to give [place] permission to discuss obesity behavioral modification therapy today.  ASSESS: Crystin has the diagnosis of obesity and her BMI today is @TBMI @ My[place] is in the action stage of change   ADVISE: Brenda Frost was educated on the multiple health risks of obesity as well as the benefit of weight loss to improve her health. She was advised of the need for long term treatment and the importance of lifestyle modifications to improve her current health  and to decrease her risk of future health problems.  AGREE: Multiple dietary modification options and treatment options were discussed and  Brenda Frost agreed to follow the recommendations documented in the above note.  ARRANGE: Keelee was educated on the importance of frequent visits to treat obesity as outlined per CMS and USPSTF guidelines and agreed to schedule her next follow up appointment today.  I, Melisa Donofrio , am acting as transcriptionist for Dr Dennard Nip  I have reviewed the above  documentation for accuracy and completeness, and I agree with the above. -Dennard Nip, MD

## 2017-12-29 ENCOUNTER — Ambulatory Visit (INDEPENDENT_AMBULATORY_CARE_PROVIDER_SITE_OTHER): Payer: 59 | Admitting: Family Medicine

## 2018-02-27 MED FILL — LEVOTHYROXINE 88 MCG TABLET: 88 | 90 days supply | Qty: 90 | Fill #3

## 2018-06-02 ENCOUNTER — Ambulatory Visit: Payer: Self-pay | Admitting: *Deleted

## 2018-06-02 ENCOUNTER — Ambulatory Visit (INDEPENDENT_AMBULATORY_CARE_PROVIDER_SITE_OTHER): Payer: 59 | Admitting: Family Medicine

## 2018-06-02 ENCOUNTER — Encounter: Payer: Self-pay | Admitting: Family Medicine

## 2018-06-02 ENCOUNTER — Other Ambulatory Visit: Payer: Self-pay

## 2018-06-02 VITALS — BP 120/81 | HR 84 | Temp 98.5°F | Resp 16 | Ht 65.0 in | Wt 185.2 lb

## 2018-06-02 DIAGNOSIS — B349 Viral infection, unspecified: Secondary | ICD-10-CM | POA: Diagnosis not present

## 2018-06-02 DIAGNOSIS — J019 Acute sinusitis, unspecified: Secondary | ICD-10-CM | POA: Diagnosis not present

## 2018-06-02 MED ORDER — AMOXICILLIN 875 MG PO TABS: 875.0000 mg | ORAL_TABLET | Freq: Two times a day (BID) | ORAL | 0 refills | Status: DC

## 2018-06-02 NOTE — Progress Notes (Signed)
OFFICE VISIT  06/02/2018   CC:  Chief Complaint  Patient presents with  . Cough   HPI:    Patient is a 53 y.o. Caucasian female who presents for "head cold". About 5 d of mild malaise, nasal congestion, R side of face hurts, R ear hurts, coughs a lot.  No fever. +Diffuse maxillary teeth pain bilat. Throat raw from coughing.  No rattle or wheeze in chest.  Hurts in center of chest when coughing.  No SOB.   Mild diffuse achiness.  Some HA on and off.  Slight nausea yesterday, no vomiting or diarrhea.  No rash.  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   Past Medical History:  Diagnosis Date  . Hyperlipidemia   . Hypothyroidism     Past Surgical History:  Procedure Laterality Date  . CESAREAN SECTION    . ORIF ELBOW FRACTURE Right 02/15/2016   Procedure: OPEN REDUCTION INTERNAL FIXATION (ORIF) ELBOW/OLECRANON FRACTURE;  Surgeon: Iran Planas, MD;  Location: [place];  Service: Orthopedics;  Laterality: Right;    Outpatient Medications Prior to Visit  Medication Sig Dispense Refill  . levothyroxine (SYNTHROID, LEVOTHROID) 88 MCG tablet Take 1 tablet (88 mcg total) by mouth daily. 90 tablet 3  . Vitamin D, Ergocalciferol, (DRISDOL) 50000 units CAPS capsule Take 1 capsule (50,000 Units total) by mouth every 7 (seven) days. (Patient not taking: Reported on 06/02/2018) 4 capsule 0   No facility-administered medications prior to visit.     No Known Allergies  ROS As per HPI  PE: Blood pressure 120/81, pulse 84, temperature 98.5 F (36.9 C), temperature source Oral, resp. rate 16, height 5\' 5"  (1.651 m), weight 185 lb 4 oz (84 kg), last menstrual period 04/25/2016, SpO2 99 %. VS: noted--normal. Gen: alert, NAD, NONTOXIC APPEARING. HEENT: eyes without injection, drainage, or swelling.  Ears: EACs clear, TMs with normal light reflex and landmarks.  Nose: Clear rhinorrhea, with some dried, crusty exudate adherent to mildly injected mucosa.  No purulent d/c.  No paranasal sinus TTP.   No facial swelling.  Throat and mouth without focal lesion.  No pharyngial swelling, erythema, or exudate.   Neck: supple, no LAD.   LUNGS: CTA bilat, nonlabored resps.   CV: RRR, no m/r/g. EXT: no c/c/e SKIN: no rash  LABS:    Chemistry      Component Value Date/Time   NA 142 12/08/2017 0820   K 4.3 12/08/2017 0820   CL 104 12/08/2017 0820   CO2 24 12/08/2017 0820   BUN 13 12/08/2017 0820   CREATININE 0.88 12/08/2017 0820   CREATININE 0.82 08/11/2015 1530      Component Value Date/Time   CALCIUM 9.7 12/08/2017 0820   ALKPHOS 113 12/08/2017 0820   AST 10 12/08/2017 0820   ALT 10 12/08/2017 0820   BILITOT 0.6 12/08/2017 0820       IMPRESSION AND PLAN:  Viral syndrome, now with evidence of acute sinusitis: Amoxil 875mg  bid x 10d. Get otc generic robitussin DM OR Mucinex DM and use as directed on the packaging for cough and congestion. Use otc generic saline nasal spray 2-3 times per day to irrigate/moisturize your nasal passages. Push fluids. REST!  An After Visit Summary was printed and given to the patient.  FOLLOW UP: Return if symptoms worsen or fail to improve.  Signed:  Crissie Sickles, MD           06/02/2018

## 2018-06-02 NOTE — Telephone Encounter (Signed)
Pt's husband, Alvester Chou calling with concerns of when to take the pt to ED for fever . Pt's husband reports that fever was 99 and is now 100. Pt also asking if testing was done in the office for corona virus. Pt's husband advised that testing for corona virus was not available in the office at this time. Pt was seen in the office today with Dr. Anitra Lauth and was diagnosed with sinusitis and prescribed an antibiotic. Pt's husband given home care advice and advised that if fever does not decrease with home care advice to take the pt to the ED. Understanding verbalized.  Reason for Disposition . [1] Fever AND [2] no signs of serious infection or localizing symptoms (all other triage questions negative)  Answer Assessment - Initial Assessment Questions 1. TEMPERATURE: "What is the most recent temperature?"  "How was it measured?"      100.0 2. ONSET: "When did the fever start?"      today 3. SYMPTOMS: "Do you have any other symptoms besides the fever?"  (e.g., colds, headache, sore throat, earache, cough, rash, diarrhea, vomiting, abdominal pain)     Achy, sore and coughing 4. CAUSE: If there are no symptoms, ask: "What do you think is causing the fever?"      Seen today in the office and was treated with antibiotics for a sinus infection 5. CONTACTS: "Does anyone else in the family have an infection?"     Healthcare worker 6. TREATMENT: "What have you done so far to treat this fever?" (e.g., medications)     Nothing at this time  Protocols used: FEVER-A-AH

## 2018-06-02 NOTE — Patient Instructions (Signed)
Get otc generic robitussin DM OR Mucinex DM and use as directed on the packaging for cough and congestion. Use otc generic saline nasal spray 2-3 times per day to irrigate/moisturize your nasal passages.   

## 2018-06-05 ENCOUNTER — Telehealth: Payer: Self-pay

## 2018-06-05 ENCOUNTER — Encounter: Payer: Self-pay | Admitting: Family Medicine

## 2018-06-05 ENCOUNTER — Other Ambulatory Visit: Payer: Self-pay

## 2018-06-05 ENCOUNTER — Ambulatory Visit (INDEPENDENT_AMBULATORY_CARE_PROVIDER_SITE_OTHER): Payer: 59 | Admitting: Family Medicine

## 2018-06-05 VITALS — BP 110/76 | HR 87 | Temp 98.6°F

## 2018-06-05 DIAGNOSIS — R059 Cough, unspecified: Secondary | ICD-10-CM

## 2018-06-05 DIAGNOSIS — R05 Cough: Secondary | ICD-10-CM | POA: Diagnosis not present

## 2018-06-05 LAB — POCT INFLUENZA A/B
Influenza A, POC: NEGATIVE
Influenza B, POC: NEGATIVE

## 2018-06-05 NOTE — Progress Notes (Signed)
Questions for Screening COVID-19  Symptom onset: 4 days ago  Travel or Contacts: no travel, no known contacts  During this illness, did/does the patient experience any of the following symptoms? Fever >100.45F []   Yes [x]   No []   Unknown Subjective fever (felt feverish) []   Yes [x]   No []   Unknown Chills []   Yes [x]   No []   Unknown Muscle aches (myalgia) []   Yes [x]   No []   Unknown Runny nose (rhinorrhea) []   Yes [x]   No []   Unknown Sore throat [x]   Yes []   No []   Unknown Cough (new onset or worsening of chronic cough) [x]   Yes []   No []   Unknown Shortness of breath (dyspnea) []   Yes [x]   No []   Unknown Nausea or vomiting [x]   Yes []   No []   Unknown Headache []   Yes [x]   No []   Unknown Abdominal pain  []   Yes [x]   No []   Unknown Diarrhea (?3 loose/looser than normal stools/24hr period) []   Yes [x]   No []   Unknown Other, specify:_____________________________________________   Patient risk factors: Smoker? []   Current []   Former [x]   Never If female, currently pregnant? []   Yes [x]   No  Patient Active Problem List   Diagnosis Date Noted  . Insulin resistance 09/28/2017  . Vitamin D deficiency 09/28/2017  . Hormone replacement therapy (HRT) 09/28/2017  . Nonspecific (abnormal) findings on radiological and other examination of biliary tract 08/09/2013    Plan:  []   High risk for COVID-19 with red flags go to ED (with CP, SOB, weak/lightheaded, or fever > 101.5). Call ahead.  []   High risk for COVID-19 but stable will have car visit. Inform provider and coordinate time. Will be completed in afternoon. [x]   No red flags but URI signs or symptoms will go through side door and be seen in dedicated room.  Note: Referral to telemedicine is an appropriate alternative disposition for higher risk but stable. [facility]/e-Visit: [phone].

## 2018-06-05 NOTE — Progress Notes (Deleted)
Questions for Screening COVID-19  Symptom onset: Last week  Travel or Contacts: Works in healthcare  During this illness, did/does the patient experience any of the following symptoms? Fever >100.31F [x]   Yes []   No []   Unknown Subjective fever (felt feverish) []   Yes []   No []   Unknown Chills [x]   Yes []   No []   Unknown Muscle aches (myalgia) [x]   Yes []   No []   Unknown Runny nose (rhinorrhea) [x]   Yes []   No []   Unknown Sore throat [x]   Yes []   No []   Unknown Cough (new onset or worsening of chronic cough) [x]   Yes []   No []   Unknown Shortness of breath (dyspnea) []   Yes [x]   No []   Unknown Nausea or vomiting []   Yes [x]   No []   Unknown Headache []   Yes [x]   No []   Unknown Abdominal pain  []   Yes [x]   No []   Unknown Diarrhea (?3 loose/looser than normal stools/24hr period) []   Yes [x]   No []   Unknown Other, specify:_____________________________________________   Patient risk factors: Smoker? []   Current []   Former [x]   Never If female, currently pregnant? []   Yes [x]   No  Patient Active Problem List   Diagnosis Date Noted  . Insulin resistance 09/28/2017  . Vitamin D deficiency 09/28/2017  . Hormone replacement therapy (HRT) 09/28/2017  . Nonspecific (abnormal) findings on radiological and other examination of biliary tract 08/09/2013   Plan:  []   High risk for COVID-19 with red flags go to ED (with CP, SOB, weak/lightheaded, or fever > 101.5). Call ahead.  []   High risk for COVID-19 but stable will have car visit. Inform provider and coordinate time. Will be completed in afternoon. [x]   No red flags but URI signs or symptoms will go through side door and be seen in dedicated room.  Note: Referral to telemedicine is an appropriate alternative disposition for higher risk but stable. [facility]/e-Visit: [phone].

## 2018-06-05 NOTE — Progress Notes (Signed)
   ACUTE VISIT, COVID-19  Subjective:   HPI: Patient is in today for symptoms concerning for COVID-19. Please see prescreen note for history and ROS.  Patient Active Problem List   Diagnosis Date Noted  . Insulin resistance 09/28/2017  . Vitamin D deficiency 09/28/2017  . Hormone replacement therapy (HRT) 09/28/2017  . Nonspecific (abnormal) findings on radiological and other examination of biliary tract 08/09/2013   Social History   Tobacco Use  . Smoking status: Never Smoker  . Smokeless tobacco: Never Used  Substance Use Topics  . Alcohol use: Yes    Alcohol/week: 2.0 standard drinks    Types: 2 Cans of beer per week   Current Outpatient Medications:  .  amoxicillin (AMOXIL) 875 MG tablet, Take 1 tablet (875 mg total) by mouth 2 (two) times daily., Disp: 20 tablet, Rfl: 0 .  levothyroxine (SYNTHROID, LEVOTHROID) 88 MCG tablet, Take 1 tablet (88 mcg total) by mouth daily., Disp: 90 tablet, Rfl: 3 .  Vitamin D, Ergocalciferol, (DRISDOL) 50000 units CAPS capsule, Take 1 capsule (50,000 Units total) by mouth every 7 (seven) days. (Patient not taking: Reported on 06/02/2018), Disp: 4 capsule, Rfl: 0  No Known Allergies  Objective:    Blood pressure 110/76, pulse 87, temperature 98.6 F (37 C), temperature source Oral, last menstrual period 04/25/2016, SpO2 97 %.   General appearance: alert, cooperative, and no distress. Head: normocephalic, without obvious abnormality, atraumatic. Eyes: conjunctivae/corneas clear. PERRL, EOM's intact. Ears: normal TM's and external ear canals both ears. Nose: Nares normal. Septum midline. Mucosa normal. No drainage or sinus tenderness.. Throat: lips, mucosa, and tongue normal; teeth and gums normal. Lungs: clear to auscultation bilaterally. Heart: regular rate and rhythm, no murmur appreciated. Extremities: extremities normal, atraumatic, no cyanosis or edema.  Assessment & Plan:   1. Cough    Low suspicion of COVID-19: Patient has a  mild respiratory illness without signs of acute distress or respiratory compromise at this time. This is likely a viral infection, which can come from a number of respiratory viruses. They have not had immediate known contact with a suspected or proven COVID-19 case. At this time, will hold off on COVID-19 testing. As a precaution, they have been advised to remain home for 2 weeks from symptom onset or 48 hours after resolution of fever, whichever is longer. Advised if they experience a "second sickening" or worsening symptoms as the illness progresses, they are to call the office for further instructions or seek emergent evaluation for any severe symptoms.   Orders Placed This Encounter  Procedures  . POCT Influenza A/B   . Reviewed expectations re: course of current medical issues. . Discussed self-management of symptoms. . Outlined signs and symptoms indicating need for more acute intervention. . Patient verbalized understanding and all questions were answered. . See orders for this visit as documented in the electronic medical record. . Patient received an After Visit Summary.  Briscoe Deutscher, DO 06/06/2018

## 2018-06-05 NOTE — Telephone Encounter (Signed)
Called and spoke to patient regarding covid-19 prescreening.    FYI pt states that she was seen at [facility] on 3/13 and dx w/sinus infection and put on Amox.  Not feeling much better and wanted to make sure that she is on the right course of treatment.

## 2018-06-05 NOTE — Patient Instructions (Signed)

## 2018-06-06 ENCOUNTER — Encounter: Payer: Self-pay | Admitting: Family Medicine

## 2018-06-14 ENCOUNTER — Encounter: Payer: Self-pay | Admitting: Family Medicine

## 2018-06-15 ENCOUNTER — Other Ambulatory Visit: Payer: Self-pay | Admitting: Family Medicine

## 2018-06-16 MED FILL — LEVOTHYROXINE 88 MCG TABLET: 88 | 90 days supply | Qty: 90 | Fill #0

## 2018-08-02 DIAGNOSIS — R87619 Unspecified abnormal cytological findings in specimens from cervix uteri: Secondary | ICD-10-CM | POA: Insufficient documentation

## 2018-08-02 DIAGNOSIS — Z6830 Body mass index (BMI) 30.0-30.9, adult: Secondary | ICD-10-CM | POA: Diagnosis not present

## 2018-08-02 DIAGNOSIS — Z1231 Encounter for screening mammogram for malignant neoplasm of breast: Secondary | ICD-10-CM | POA: Diagnosis not present

## 2018-08-02 DIAGNOSIS — Z01419 Encounter for gynecological examination (general) (routine) without abnormal findings: Secondary | ICD-10-CM | POA: Diagnosis not present

## 2018-08-02 DIAGNOSIS — Z13820 Encounter for screening for osteoporosis: Secondary | ICD-10-CM | POA: Diagnosis not present

## 2018-09-13 MED FILL — LEVOTHYROXINE 88 MCG TABLET: 88 | 90 days supply | Qty: 90 | Fill #0

## 2018-11-30 ENCOUNTER — Encounter: Payer: Self-pay | Admitting: Family Medicine

## 2018-12-12 MED FILL — LEVOTHYROXINE 88 MCG TABLET: 88 | 90 days supply | Qty: 90 | Fill #1

## 2018-12-13 ENCOUNTER — Encounter: Payer: Self-pay | Admitting: Family Medicine

## 2018-12-20 ENCOUNTER — Ambulatory Visit (INDEPENDENT_AMBULATORY_CARE_PROVIDER_SITE_OTHER): Payer: 59 | Admitting: Family Medicine

## 2018-12-20 ENCOUNTER — Other Ambulatory Visit: Payer: Self-pay

## 2018-12-20 ENCOUNTER — Encounter: Payer: Self-pay | Admitting: Family Medicine

## 2018-12-20 VITALS — BP 128/82 | HR 65 | Temp 97.5°F | Ht 65.0 in | Wt 190.4 lb

## 2018-12-20 DIAGNOSIS — E669 Obesity, unspecified: Secondary | ICD-10-CM

## 2018-12-20 DIAGNOSIS — M546 Pain in thoracic spine: Secondary | ICD-10-CM | POA: Diagnosis not present

## 2018-12-20 DIAGNOSIS — E559 Vitamin D deficiency, unspecified: Secondary | ICD-10-CM

## 2018-12-20 DIAGNOSIS — R1013 Epigastric pain: Secondary | ICD-10-CM

## 2018-12-20 DIAGNOSIS — Z6830 Body mass index (BMI) 30.0-30.9, adult: Secondary | ICD-10-CM

## 2018-12-20 DIAGNOSIS — E7800 Pure hypercholesterolemia, unspecified: Secondary | ICD-10-CM | POA: Diagnosis not present

## 2018-12-20 DIAGNOSIS — E8881 Metabolic syndrome: Secondary | ICD-10-CM

## 2018-12-20 DIAGNOSIS — R5383 Other fatigue: Secondary | ICD-10-CM | POA: Diagnosis not present

## 2018-12-20 DIAGNOSIS — Z79890 Hormone replacement therapy (postmenopausal): Secondary | ICD-10-CM | POA: Diagnosis not present

## 2018-12-20 DIAGNOSIS — F439 Reaction to severe stress, unspecified: Secondary | ICD-10-CM | POA: Diagnosis not present

## 2018-12-20 DIAGNOSIS — E88819 Insulin resistance, unspecified: Secondary | ICD-10-CM

## 2018-12-20 LAB — URINALYSIS, ROUTINE W REFLEX MICROSCOPIC
Bilirubin Urine: NEGATIVE
Hgb urine dipstick: NEGATIVE
Ketones, ur: NEGATIVE
Nitrite: NEGATIVE
Specific Gravity, Urine: 1.02 (ref 1.000–1.030)
Total Protein, Urine: NEGATIVE
Urine Glucose: NEGATIVE
Urobilinogen, UA: 0.2 (ref 0.0–1.0)
pH: 6.5 (ref 5.0–8.0)

## 2018-12-20 LAB — COMPREHENSIVE METABOLIC PANEL
ALT: 18 U/L (ref 0–35)
AST: 16 U/L (ref 0–37)
Albumin: 4.2 g/dL (ref 3.5–5.2)
Alkaline Phosphatase: 117 U/L (ref 39–117)
BUN: 16 mg/dL (ref 6–23)
CO2: 30 mEq/L (ref 19–32)
Calcium: 10.3 mg/dL (ref 8.4–10.5)
Chloride: 104 mEq/L (ref 96–112)
Creatinine, Ser: 0.81 mg/dL (ref 0.40–1.20)
GFR: 74.17 mL/min (ref >60.00)
Glucose, Bld: 96 mg/dL (ref 70–99)
Potassium: 4.4 mEq/L (ref 3.5–5.1)
Sodium: 142 mEq/L (ref 135–145)
Total Bilirubin: 0.7 mg/dL (ref 0.2–1.2)
Total Protein: 7.1 g/dL (ref 6.0–8.3)

## 2018-12-20 LAB — LIPID PANEL
Cholesterol: 307 mg/dL — ABNORMAL HIGH (ref 0–200)
HDL: 90.6 mg/dL (ref >39.00)
LDL Cholesterol: 203 mg/dL — ABNORMAL HIGH (ref 0–99)
NonHDL: 216.83
Total CHOL/HDL Ratio: 3
Triglycerides: 67 mg/dL (ref 0.0–149.0)
VLDL: 13.4 mg/dL (ref 0.0–40.0)

## 2018-12-20 LAB — TSH: TSH: 0.2 u[IU]/mL — ABNORMAL LOW (ref 0.35–4.50)

## 2018-12-20 LAB — HEMOGLOBIN A1C: Hgb A1c MFr Bld: 6.2 % (ref 4.6–6.5)

## 2018-12-20 LAB — MAGNESIUM: Magnesium: 2.2 mg/dL (ref 1.5–2.5)

## 2018-12-20 LAB — VITAMIN B12: Vitamin B-12: 260 pg/mL (ref 211–911)

## 2018-12-20 LAB — CBC WITH DIFFERENTIAL/PLATELET
Basophils Absolute: 0 10*3/uL (ref 0.0–0.1)
Basophils Relative: 0.3 % (ref 0.0–3.0)
Eosinophils Absolute: 0.1 10*3/uL (ref 0.0–0.7)
Eosinophils Relative: 1.6 % (ref 0.0–5.0)
HCT: 42.8 % (ref 36.0–46.0)
Hemoglobin: 14.1 g/dL (ref 12.0–15.0)
Lymphocytes Relative: 35.9 % (ref 12.0–46.0)
Lymphs Abs: 2 10*3/uL (ref 0.7–4.0)
MCHC: 32.9 g/dL (ref 30.0–36.0)
MCV: 88.7 fl (ref 78.0–100.0)
Monocytes Absolute: 0.4 10*3/uL (ref 0.1–1.0)
Monocytes Relative: 7.2 % (ref 3.0–12.0)
Neutro Abs: 3.1 10*3/uL (ref 1.4–7.7)
Neutrophils Relative %: 55 % (ref 43.0–77.0)
Platelets: 213 10*3/uL (ref 150.0–400.0)
RBC: 4.82 Mil/uL (ref 3.87–5.11)
RDW: 13.7 % (ref 11.5–15.5)
WBC: 5.7 10*3/uL (ref 4.0–10.5)

## 2018-12-20 LAB — VITAMIN D 25 HYDROXY (VIT D DEFICIENCY, FRACTURES): VITD: 21.75 ng/mL — ABNORMAL LOW (ref 30.00–100.00)

## 2018-12-20 LAB — T4, FREE: Free T4: 1.1 ng/dL (ref 0.60–1.60)

## 2018-12-20 LAB — LIPASE: Lipase: 186 U/L — ABNORMAL HIGH (ref 11.0–59.0)

## 2018-12-20 MED ORDER — ESOMEPRAZOLE MAGNESIUM 40 MG PO CPDR: 40.0000 mg | DELAYED_RELEASE_CAPSULE | Freq: Every day | ORAL | 3 refills | Status: DC

## 2018-12-20 MED ORDER — SUCRALFATE 1 G PO TABS: 1.0000 g | ORAL_TABLET | Freq: Three times a day (TID) | ORAL | 2 refills | Status: DC

## 2018-12-20 MED FILL — SUCRALFATE 1 GM TABLET: 1 | 22 days supply | Qty: 90 | Fill #0

## 2018-12-20 MED FILL — ESOMEPRAZOLE MAG DR 40 MG C: 40 | 30 days supply | Qty: 30 | Fill #0

## 2018-12-20 NOTE — Progress Notes (Signed)
Brenda Frost is a 53 y.o. female is here for follow up.  History of Present Illness:   HPI: Very stressed. Daughter's lymphoma returned. Going to have stem cell transplant in a few weeks. Some PTSD with this since they went through her first bout of cancer treatment last October. Working 10-12 hours daily to keep up with workload. Not sleeping well.   Pain in upper abdomen and mid back. Hx of dyspepsia but has not been taking any medication.   Health Maintenance Due  Topic Date Due  . PAP SMEAR-Modifier  09/03/1996  . COLONOSCOPY  09/03/2016  . COLON CANCER SCREENING ANNUAL FOBT  10/06/2018   Depression screen [facility] 2/9 12/20/2018 09/28/2017 09/01/2017  Decreased Interest 1 0 3  Down, Depressed, Hopeless 1 0 3  PHQ - 2 Score 2 0 6  Altered sleeping 2 1 1   Tired, decreased energy 2 1 2   Change in appetite 2 1 2   Feeling bad or failure about yourself  1 0 2  Trouble concentrating 2 0 3  Moving slowly or fidgety/restless 2 0 0  Suicidal thoughts 0 0 0  PHQ-9 Score 13 3 16   Difficult doing work/chores Somewhat difficult Not difficult at all Not difficult at all   PMHx, SurgHx, SocialHx, FamHx, Medications, and Allergies were reviewed in the Visit Navigator and updated as appropriate.   Patient Active Problem List   Diagnosis Date Noted  . Abnormal cervical Papanicolaou smear 08/02/2018  . Insulin resistance 09/28/2017  . Vitamin D deficiency 09/28/2017  . Hormone replacement therapy (HRT) 09/28/2017  . Nonspecific (abnormal) findings on radiological and other examination of biliary tract 08/09/2013   Social History   Tobacco Use  . Smoking status: Never Smoker  . Smokeless tobacco: Never Used  Substance Use Topics  . Alcohol use: Yes    Alcohol/week: 2.0 standard drinks    Types: 2 Cans of beer per week  . Drug use: No   Current Medications and Allergies   .  levothyroxine (SYNTHROID, LEVOTHROID) 88 MCG tablet, TAKE 1 TABLET (88 MCG TOTAL) BY MOUTH DAILY., Disp: 90  tablet, Rfl: 3  No Known Allergies   Review of Systems   Pertinent items are noted in the HPI. Otherwise, a complete ROS is negative.  Vitals   Vitals:   12/20/18 0942  BP: 128/82  Pulse: 65  Temp: (!) 97.5 F (36.4 C)  TempSrc: Temporal  SpO2: 99%  Weight: 190 lb 6.4 oz (86.4 kg)  Height: 5\' 5"  (1.651 m)     Body mass index is 31.68 kg/m.  Physical Exam   Physical Exam Vitals signs and nursing note reviewed.  HENT:     Head: Normocephalic and atraumatic.  Eyes:     Pupils: Pupils are equal, round, and reactive to light.  Neck:     Musculoskeletal: Normal range of motion and neck supple.  Cardiovascular:     Rate and Rhythm: Normal rate and regular rhythm.     Heart sounds: Normal heart sounds.  Pulmonary:     Effort: Pulmonary effort is normal.  Abdominal:     Palpations: Abdomen is soft.  Skin:    General: Skin is warm.  Psychiatric:        Behavior: Behavior normal.    Assessment and Plan   Brenda Frost was seen today for follow-up and heartburn.  Diagnoses and all orders for this visit:  Insulin resistance -     Hemoglobin A1c  Vitamin D deficiency -  VITAMIN D 25 Hydroxy (Vit-D Deficiency, Fractures)  Hormone replacement therapy (HRT)  Class 1 obesity with serious comorbidity and body mass index (BMI) of 30.0 to 30.9 in adult, unspecified obesity type  Other fatigue -     CBC with Differential/Platelet -     Comprehensive metabolic panel -     Iron, TIBC and Ferritin Panel -     Magnesium -     Vitamin B12 -     TSH -     T4, free -     Thyroid Peroxidase Antibodies (TPO) (REFL)  Situational stress  Caregiver with fatigue  Dyspepsia -     CBC with Differential/Platelet -     Comprehensive metabolic panel -     Lipase -     esomeprazole (NEXIUM) 40 MG capsule; Take 1 capsule (40 mg total) by mouth daily. -     sucralfate (CARAFATE) 1 g tablet; Take 1 tablet (1 g total) by mouth 4 (four) times daily -  with meals and at  bedtime.  Acute midline thoracic back pain -     Urine Culture -     Urinalysis, Routine w reflex microscopic -     Lipase  Pure hypercholesterolemia -     Lipid panel   . Orders and follow up as documented in [place], reviewed diet, exercise and weight control, cardiovascular risk and specific lipid/LDL goals reviewed, reviewed medications and side effects in detail.  . Reviewed expectations re: course of current medical issues. . Outlined signs and symptoms indicating need for more acute intervention. . Patient verbalized understanding and all questions were answered. . Patient received an After Visit Summary.  Briscoe Deutscher, DO [place], [place] 12/21/2018

## 2018-12-21 ENCOUNTER — Encounter: Payer: Self-pay | Admitting: Family Medicine

## 2018-12-21 ENCOUNTER — Telehealth: Payer: Self-pay | Admitting: Family Medicine

## 2018-12-21 NOTE — Telephone Encounter (Signed)
She would like a referral for an ultrasound.  She said that she and Dr Juleen China have spoken about this.

## 2018-12-22 ENCOUNTER — Other Ambulatory Visit: Payer: Self-pay

## 2018-12-22 ENCOUNTER — Other Ambulatory Visit: Payer: Self-pay | Admitting: Family Medicine

## 2018-12-22 ENCOUNTER — Telehealth: Payer: Self-pay | Admitting: Physical Therapy

## 2018-12-22 DIAGNOSIS — R899 Unspecified abnormal finding in specimens from other organs, systems and tissues: Secondary | ICD-10-CM

## 2018-12-22 LAB — THYROID PEROXIDASE ANTIBODIES (TPO) (REFL): Thyroperoxidase Ab SerPl-aCnc: 1 IU/mL (ref <9)

## 2018-12-22 LAB — URINE CULTURE
MICRO NUMBER:: 939386
Result:: NO GROWTH
SPECIMEN QUALITY:: ADEQUATE

## 2018-12-22 LAB — IRON,TIBC AND FERRITIN PANEL
%SAT: 28 % (calc) (ref 16–45)
Ferritin: 85 ng/mL (ref 16–232)
Iron: 88 ug/dL (ref 45–160)
TIBC: 312 mcg/dL (calc) (ref 250–450)

## 2018-12-22 MED ORDER — LEVOTHYROXINE SODIUM 75 MCG PO TABS: 75.0000 ug | ORAL_TABLET | Freq: Every day | ORAL | 3 refills | Status: DC

## 2018-12-22 MED FILL — LEVOTHYROXINE 75 MCG TABLET: 75 | 90 days supply | Qty: 90 | Fill #0

## 2018-12-22 NOTE — Telephone Encounter (Signed)
Called and reviewed all with patient.

## 2018-12-22 NOTE — Telephone Encounter (Signed)
Sending to [place] r.e. referral status and to [facility] for the rest. Brenda Frost  Copied from [place] [id number]. Topic: General - Inquiry >> Dec 22, 2018 11:21 AM Brenda Frost wrote: Reason for CRM:   Pt calling to check on status of referral for ultrasound - spoke with [facility]. Pt states that she was told via MyChart to decrease the synthroid but did not give pt the appropriate dose to go down to.  Pt states she needs to check and see when FMLA paperwork will be done as she is on a time stamp at work. Pt wants to know if liver function tests need to be done and if so can that be added to the bloodwork done on the 30th.

## 2018-12-22 NOTE — Telephone Encounter (Signed)
Called and made a lab appointment.

## 2018-12-22 NOTE — Telephone Encounter (Signed)
Addressed in another message.

## 2018-12-22 NOTE — Telephone Encounter (Signed)
Referral done

## 2018-12-25 ENCOUNTER — Other Ambulatory Visit: Payer: Self-pay

## 2018-12-25 ENCOUNTER — Other Ambulatory Visit: Payer: 59

## 2018-12-25 ENCOUNTER — Ambulatory Visit (HOSPITAL_COMMUNITY)
Admission: RE | Admit: 2018-12-25 | Discharge: 2018-12-25 | Disposition: A | Discharge status: Home / Self Care | Payer: 59 | Source: Ambulatory Visit | Attending: Family Medicine | Admitting: Family Medicine

## 2018-12-25 DIAGNOSIS — R899 Unspecified abnormal finding in specimens from other organs, systems and tissues: Secondary | ICD-10-CM | POA: Insufficient documentation

## 2018-12-25 DIAGNOSIS — K861 Other chronic pancreatitis: Secondary | ICD-10-CM

## 2018-12-25 DIAGNOSIS — K824 Cholesterolosis of gallbladder: Secondary | ICD-10-CM | POA: Diagnosis not present

## 2018-12-25 NOTE — Telephone Encounter (Signed)
See note  Copied from [place] [id number]. Topic: General - Other >> Dec 25, 2018 11:24 AM Leward Quan A wrote: Reason for CRM: Patient called to say that she is at an appointment with her daughter at [facility]  and not sure if she can come later in the afternoon for her LFT test but also want to know if she need to get an MRCT / MRI to evaluate what is going on with her Gall Bladder if there are stones or not. Please call Ph# [phone]

## 2018-12-26 ENCOUNTER — Other Ambulatory Visit (INDEPENDENT_AMBULATORY_CARE_PROVIDER_SITE_OTHER): Payer: 59

## 2018-12-26 ENCOUNTER — Telehealth: Payer: Self-pay | Admitting: Family Medicine

## 2018-12-26 ENCOUNTER — Encounter: Payer: Self-pay | Admitting: Family Medicine

## 2018-12-26 DIAGNOSIS — R899 Unspecified abnormal finding in specimens from other organs, systems and tissues: Secondary | ICD-10-CM | POA: Diagnosis not present

## 2018-12-26 LAB — COMPREHENSIVE METABOLIC PANEL
ALT: 15 U/L (ref 0–35)
AST: 14 U/L (ref 0–37)
Albumin: 4.1 g/dL (ref 3.5–5.2)
Alkaline Phosphatase: 114 U/L (ref 39–117)
BUN: 14 mg/dL (ref 6–23)
CO2: 30 mEq/L (ref 19–32)
Calcium: 10.1 mg/dL (ref 8.4–10.5)
Chloride: 105 mEq/L (ref 96–112)
Creatinine, Ser: 0.85 mg/dL (ref 0.40–1.20)
GFR: 70.15 mL/min (ref >60.00)
Glucose, Bld: 111 mg/dL — ABNORMAL HIGH (ref 70–99)
Potassium: 4.1 mEq/L (ref 3.5–5.1)
Sodium: 140 mEq/L (ref 135–145)
Total Bilirubin: 0.8 mg/dL (ref 0.2–1.2)
Total Protein: 6.9 g/dL (ref 6.0–8.3)

## 2018-12-26 LAB — LIPASE: Lipase: 39 U/L (ref 11.0–59.0)

## 2018-12-26 NOTE — Telephone Encounter (Signed)
Have l/m to call office make sure all issues have been addressed. Has had several types of messages in the last few days.

## 2018-12-26 NOTE — Telephone Encounter (Signed)
Copied from [place] [id number]. Topic: General - Other >> Dec 25, 2018 11:24 AM Leward Quan A wrote: Reason for CRM: Patient called to say that she is at an appointment with her daughter at [facility]  and not sure if she can come later in the afternoon for her LFT test but also want to know if she need to get an MRCT / MRI to evaluate what is going on with her Gall Bladder if there are stones or not. Please call Ph# [phone] >> Dec 26, 2018  9:06 AM Leward Quan A wrote: Patient called to find out about referral to a surgeon, Thyroid medication have not been received at the pharmacy, also was to have labs on 12/25/2018. Please advise

## 2018-12-26 NOTE — Telephone Encounter (Signed)
See note  Copied from [place] [phone]. Topic: General - Other >> Dec 26, 2018  1:19 PM Celene Kras A wrote: Reason for CRM: Pt called regarding her FMLA paperwork. Please contact pt and advise.

## 2018-12-26 NOTE — Telephone Encounter (Signed)
Pt stopped by - she would like a GI referral to Dr Rush Landmark at [place].

## 2018-12-27 ENCOUNTER — Encounter: Payer: Self-pay | Admitting: Gastroenterology

## 2018-12-27 NOTE — Telephone Encounter (Signed)
Please advise. Patient would like Dr. Alcario Drought opinion on should she have a referral to GI, based on previous MyChart message sent to Dr. Juleen China.

## 2018-12-27 NOTE — Telephone Encounter (Signed)
There is another Mychart Message sent to Dr. Juleen China in regards to this same issue.  Please see other encounter.

## 2018-12-28 NOTE — Telephone Encounter (Signed)
Okay referral.

## 2018-12-29 ENCOUNTER — Other Ambulatory Visit: Payer: Self-pay

## 2018-12-29 ENCOUNTER — Other Ambulatory Visit: Payer: 59

## 2018-12-29 ENCOUNTER — Ambulatory Visit (INDEPENDENT_AMBULATORY_CARE_PROVIDER_SITE_OTHER): Payer: 59 | Admitting: Gastroenterology

## 2018-12-29 ENCOUNTER — Encounter: Payer: Self-pay | Admitting: Gastroenterology

## 2018-12-29 VITALS — BP 108/74 | HR 76 | Temp 97.8°F | Ht 65.0 in | Wt 188.4 lb

## 2018-12-29 DIAGNOSIS — K8500 Idiopathic acute pancreatitis without necrosis or infection: Secondary | ICD-10-CM

## 2018-12-29 DIAGNOSIS — R748 Abnormal levels of other serum enzymes: Secondary | ICD-10-CM | POA: Diagnosis not present

## 2018-12-29 DIAGNOSIS — K838 Other specified diseases of biliary tract: Secondary | ICD-10-CM

## 2018-12-29 DIAGNOSIS — R1010 Upper abdominal pain, unspecified: Secondary | ICD-10-CM

## 2018-12-29 DIAGNOSIS — R932 Abnormal findings on diagnostic imaging of liver and biliary tract: Secondary | ICD-10-CM | POA: Diagnosis not present

## 2018-12-29 NOTE — Progress Notes (Signed)
GASTROENTEROLOGY OUTPATIENT CLINIC VISIT   Primary Care Provider Briscoe Deutscher, [place] [address] [phone]  Referring Provider Briscoe Deutscher, [place],   [address] [phone]  Patient Profile: Brenda Frost is a 53 y.o. female with a pmh significant for hypothyroidism, hyperlipidemia (diet controlled).  The patient presents to the [facility] for an evaluation and management of problem(s) noted below:  Problem List 1. Idiopathic acute pancreatitis without infection or necrosis   2. Pain of upper abdomen   3. Elevated lipase   4. Dilated bile duct   5. Abnormal liver ultrasound     History of Present Illness This is the patient's first visit to the outpatient [facility] clinic.  She is referred by her primary care provider for further evaluation.  Approximately 2 weeks ago she began to have/experience recurrent heartburn and acid reflux symptoms in her chest.  Within a couple of days she then began to have abdominal discomfort in her upper abdomen.  She was planning to follow-up with her primary care provider for [facility] paperwork and told her primary care doctor about her symptoms.  Laboratories were ordered and subsequently a liver ultrasound was performed.  The pain itself is resolved although she still has some soreness in her back.  The patient was initiated on Protonix and Carafate however did not actually get the prescriptions until the day before her visit to clinic.  Interestingly in 2014 in 2015 she had similar presentation was of significant abdominal discomfort but without findings of lipase elevation.  In 2015 she underwent a cardiac work-up with a negative stress test.  In 2014 she did not have any additional work-up performed at the pain subsided.  She was treated with PPI therapy in 2015 for approximately 2 weeks.  The patient 2 weeks prior to her abdominal discomfort had been drinking apple  cider vinegar at times and she was also initiating a new antioxidant vitamin drink which she took 2 times.  She has not continued either of these since the onset of her pain.  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  No family history of GI issues.  She had undergone Cologuard testing last year and was negative per report.  In 2015 she was found to have a dilated CBD of approximately 7 to 8 mm without LFT abnormalities.  She had no gallstones noted on her ultrasound.  Her ultrasound here has not shown any evidence of gallstones but potentially a small gallbladder polyp with CBD dilation up to 6 mm.  Her triglycerides are normal.  She has never had imaging that has documented true pancreatitis.  She currently has no nausea or vomiting.  She has been losing weight.  The reason for her weight loss is due to changes in her schedule.  As well, she has to be with her daughter who is undergoing a stem cell transplant work-up over the course the next few weeks for recurrent lymphoma and will be out of work with [employer] for the next few weeks.  She has never had an upper or lower endoscopy.  She has taken nonsteroidals very infrequently.  GI Review of Systems Positive as above Negative for current pyrosis, dysphagia, odynophagia, early satiety, change in bowel habits, melena, hematochezia  Review of Systems General: Denies fevers/chills HEENT: Denies oral lesions Cardiovascular: Denies chest pain/palpitations Pulmonary: Denies shortness of  breath/nocturnal cough Gastroenterological: See HPI Genitourinary: Denies darkened urine Hematological: Denies easy bruising/bleeding Endocrine: Positive for temperature intolerance at times due to her thyroid disease Dermatological: Denies skin changes Psychological: Mood is anxious about her daughter's medical issues   Medications Current Outpatient Medications  Medication Sig  Dispense Refill   levothyroxine (SYNTHROID) 75 MCG tablet Take 1 tablet (75 mcg total) by mouth daily. 90 tablet 3   esomeprazole (NEXIUM) 40 MG capsule Take 1 capsule (40 mg total) by mouth daily. (Patient not taking: Reported on 12/29/2018) 30 capsule 3   sucralfate (CARAFATE) 1 g tablet Take 1 tablet (1 g total) by mouth 4 (four) times daily -  with meals and at bedtime. (Patient not taking: Reported on 12/29/2018) 90 tablet 2   No current facility-administered medications for this visit.     Allergies No Known Allergies  Histories Past Medical History:  Diagnosis Date   Hyperlipidemia    Hypothyroidism    Past Surgical History:  Procedure Laterality Date   CESAREAN SECTION     ORIF ELBOW FRACTURE Right 02/15/2016   Procedure: OPEN REDUCTION INTERNAL FIXATION (ORIF) ELBOW/OLECRANON FRACTURE;  Surgeon: Iran Planas, MD;  Location: [facility];  Service: Orthopedics;  Laterality: Right;   Social History   Socioeconomic History   Marital status: Married    Spouse name: Not on file   Number of children: 2   Years of education: Not on file   Highest education level: Not on file  Occupational History   Occupation: Nurse/ Research officer, political party: [place] resource strain: Not on file   Food insecurity    Worry: Not on file    Inability: Not on file   Transportation needs    Medical: Not on file    Non-medical: Not on file  Tobacco Use   Smoking status: Never Smoker   Smokeless tobacco: Never Used  Substance and Sexual Activity   Alcohol use: Yes    Alcohol/week: 1.0 standard drinks    Types: 1 Cans of beer per week   Drug use: No   Sexual activity: Not on file  Lifestyle   Physical activity    Days per week: Not on file    Minutes per session: Not on file   Stress: Not on file  Relationships   Social connections    Talks on phone: Not on file    Gets together: Not on file    Attends religious service: Not on file     Active member of club or organization: Not on file    Attends meetings of clubs or organizations: Not on file    Relationship status: Not on file   Intimate partner violence    Fear of current or ex partner: Not on file    Emotionally abused: Not on file    Physically abused: Not on file    Forced sexual activity: Not on file  Other Topics Concern   Not on file  Social History Narrative   Not on file   Family History  Problem Relation Age of Onset   Diabetes Mother    Hyperlipidemia Mother    Hypertension Mother    Kidney disease Mother    Heart disease Maternal Grandmother    CAD Neg Hx    Breast cancer Neg Hx    Colon cancer Neg Hx    Esophageal cancer Neg Hx    Inflammatory bowel disease Neg Hx    Liver disease  Neg Hx    Pancreatic cancer Neg Hx    Rectal cancer Neg Hx    Stomach cancer Neg Hx    I have reviewed her medical, social, and family history in detail and updated the electronic medical record as necessary.    PHYSICAL EXAMINATION  BP 108/74 (BP Location: Left Arm, Patient Position: Sitting, Cuff Size: Normal)    Pulse 76    Temp 97.8 F (36.6 C)    Ht 5\' 5"  (1.651 m) Comment: height measured without shoes   Wt 188 lb 6 oz (85.4 kg)    LMP 04/25/2016    BMI 31.35 kg/m  Wt Readings from Last 3 Encounters:  12/29/18 188 lb 6 oz (85.4 kg)  12/20/18 190 lb 6.4 oz (86.4 kg)  06/02/18 185 lb 4 oz (84 kg)  GEN: NAD, appears stated age, doesn't appear chronically ill PSYCH: Cooperative, without pressured speech EYE: Conjunctivae pink, sclerae anicteric ENT: MMM, without oral ulcers, no erythema or exudates noted NECK: Supple CV: RR without R/Gs  RESP: CTAB posteriorly, without wheezing GI: NABS, soft, protuberant abdomen, rounded, minimal tenderness to deep palpation in the right upper quadrant, no rebound or guarding, hepatosplenomegaly not appreciated MSK/EXT: No lower extremity edema SKIN: No jaundice NEURO:  Alert & Oriented x 3, no  focal deficits   REVIEW OF DATA  I reviewed the following data at the time of this encounter:  GI Procedures and Studies  No relevant studies to review  Laboratory Studies  Reviewed those in epic  Imaging Studies  September 2020 right upper quadrant ultrasound IMPRESSION: 1. The common bile duct measures 6.1 mm which is borderline. Recommend correlation with labs. If there is concern for obstruction based on labs or clinical assessment, an MRCP could be performed. 2. 3.5 mm polyp in the gallbladder, not requiring follow-up. 3. Probable hepatic steatosis.  2015 abdominal ultrasound IMPRESSION: Contracted gallbladder without definite visualization of gallstones. Mildly prominent CBD 7-8 mm diameter, recommend correlation with LFTs.   ASSESSMENT  Ms. Lasko is a 53 y.o. female with a pmh significant for hypothyroidism, hyperlipidemia (diet controlled).  The patient is seen today for evaluation and management of:  1. Idiopathic acute pancreatitis without infection or necrosis   2. Pain of upper abdomen   3. Elevated lipase   4. Dilated bile duct   5. Abnormal liver ultrasound    The patient is hemodynamically and clinically stable at this time.  Her presentation, in the setting of midepigastric upper abdominal discomfort with elevation in lipase greater than 3 times the upper limit of normal is suggestive of a clinical diagnosis of pancreatitis.  Interesting, that she has had this exact type of presentation without the lipase elevations on at least 2 other occasions back in 2014 2015.  Patient's liver test did not rise in any of these events.  Ultrasound imaging shows a potential gallbladder polyp but does not show any evidence of overt stones.  Interestingly however she does have some CBD dilation which has fluctuated from 2015 to now but still remains dilated.  She has been completely asymptomatic between years.  It is entirely possible that she has had concomitant secondary  gastritis or inflammation but in the interim does not have significant heartburn symptoms.  I think okay that she does not initiate her medications at this point in time unless she redevelops increased acid reflux or heartburn symptoms or recurrent pain.  If she does not have recurrent pain she should return to obtain labs within 6 to  10 hours (lipase/amylase/LFTs).  We will begin her work-up with an MRI/MRCP to further delineate the biliary ductal dilation and get a better sense of whether any gallstones are noted in the imaging.  If so then I think a surgical referral for consideration of cholecystectomy is reasonable.  However, I likely will need to perform an EGD/EUS to evaluate the biliary ductal dilation if nothing overt seen and manipulation is truly present to confirm that there is no evidence of an ampullary obstructing lesion.  Patient has had colon cancer screening previously via Cologuard.  We will obtain the MRI/MRCP in the coming weeks based on patient's availability.  She expresses desire of undergoing endoscopic evaluation but would like to wait a few weeks to couple months as she has to be there for her daughter over the course of the next few months in the setting of her stem cell transplant.  We will try to accommodate as much as we can her work-up and potential imaging studies and procedures that could be required.  Although she is only had one documented episode of lipase elevation greater than 2 times the upper limit of normal because of the multiple episodes of the same an ANA and an IgG4 we will send an ionized calcium.  Hereditary pancreatitis work-up is not recommended unless she truly shows evidence of recurrent lipase elevation of an unclear etiology.  All patient questions were answered, to the best of my ability, and the patient agrees to the aforementioned plan of action with follow-up as indicated.   PLAN  Laboratories as outlined below Patient has recurrent pain discomfort  similar to prior she should obtain laboratories (amylase/lipase/hepatic function panel) within 6 to 10 hours of pain MRI/MRCP to better delineate biliary ductal dilation If any evidence of gallstones noted on MRI/MRCP then referral to surgery for cholecystectomy will be worthwhile to consider Likely will need EGD/EUS to rule out obstructive lesion if true biliary dilation is noted (after MRI/MRCP is completed)   Orders Placed This Encounter  Procedures   MR ABDOMEN MRCP W WO CONTAST   Calcium, ionized   ANA   IgG 4   Amylase   Lipase   Hepatic function panel    New Prescriptions   No medications on file   Modified Medications   No medications on file    Planned Follow Up No follow-ups on file.   Justice Britain, MD [facility] Office # [phone]

## 2018-12-29 NOTE — Patient Instructions (Addendum)
Your provider has requested that you go to the basement level for lab work before leaving today. Press "B" on the elevator. The lab is located at the first door on the left as you exit the elevator.  If you are age 52 or younger, your body mass index should be between 19-25. Your Body mass index is 31.35 kg/m. If this is out of the aformentioned range listed, please consider follow up with your Primary Care Provider.   You have been scheduled for an MRI/MRCP at [facility] on 01/09/19. Your appointment time is 2:00pm. Please arrive 30 minutes prior to your appointment time for registration purposes. Please make certain not to have anything to eat or drink 4 hours prior( 10:00am) to your test. In addition, if you have any metal in your body, have a pacemaker or defibrillator, please be sure to let your ordering physician know. This test typically takes 45 minutes to 1 hour to complete. Should you need to reschedule, please call [phone] to do so.  .Thank you for choosing me and [facility].  Dr. Rush Landmark

## 2018-12-29 NOTE — Telephone Encounter (Signed)
Called patient she was seen today referral has been done

## 2018-12-30 ENCOUNTER — Encounter: Payer: Self-pay | Admitting: Gastroenterology

## 2018-12-30 DIAGNOSIS — K838 Other specified diseases of biliary tract: Secondary | ICD-10-CM | POA: Insufficient documentation

## 2018-12-30 DIAGNOSIS — R1010 Upper abdominal pain, unspecified: Secondary | ICD-10-CM | POA: Insufficient documentation

## 2018-12-30 DIAGNOSIS — K8500 Idiopathic acute pancreatitis without necrosis or infection: Secondary | ICD-10-CM | POA: Insufficient documentation

## 2018-12-30 DIAGNOSIS — R748 Abnormal levels of other serum enzymes: Secondary | ICD-10-CM | POA: Insufficient documentation

## 2018-12-30 DIAGNOSIS — R932 Abnormal findings on diagnostic imaging of liver and biliary tract: Secondary | ICD-10-CM | POA: Insufficient documentation

## 2018-12-30 LAB — IGG 4: IgG, Subclass 4: 42 mg/dL (ref 2–96)

## 2019-01-01 LAB — ANA: Anti Nuclear Antibody (ANA): NEGATIVE

## 2019-01-01 LAB — CALCIUM, IONIZED: Calcium, Ion: 5.49 mg/dL (ref 4.8–5.6)

## 2019-01-01 NOTE — Telephone Encounter (Signed)
PPW has been done and sent in last week.

## 2019-01-09 ENCOUNTER — Other Ambulatory Visit: Payer: Self-pay

## 2019-01-09 ENCOUNTER — Ambulatory Visit (HOSPITAL_COMMUNITY)
Admission: RE | Admit: 2019-01-09 | Discharge: 2019-01-09 | Disposition: A | Discharge status: Home / Self Care | Payer: 59 | Source: Ambulatory Visit | Attending: Gastroenterology | Admitting: Gastroenterology

## 2019-01-09 DIAGNOSIS — R748 Abnormal levels of other serum enzymes: Secondary | ICD-10-CM | POA: Diagnosis not present

## 2019-01-09 DIAGNOSIS — K859 Acute pancreatitis without necrosis or infection, unspecified: Secondary | ICD-10-CM | POA: Diagnosis not present

## 2019-01-09 DIAGNOSIS — K8500 Idiopathic acute pancreatitis without necrosis or infection: Secondary | ICD-10-CM | POA: Diagnosis not present

## 2019-01-09 DIAGNOSIS — R935 Abnormal findings on diagnostic imaging of other abdominal regions, including retroperitoneum: Secondary | ICD-10-CM | POA: Diagnosis not present

## 2019-01-09 DIAGNOSIS — K838 Other specified diseases of biliary tract: Secondary | ICD-10-CM | POA: Insufficient documentation

## 2019-01-09 MED ORDER — GADOBUTROL 1 MMOL/ML IV SOLN
8.0000 mL | Freq: Once | INTRAVENOUS | Status: AC | PRN
  Administered 2019-01-09: 17:00:00 8 mL via INTRAVENOUS

## 2019-02-19 ENCOUNTER — Encounter (INDEPENDENT_AMBULATORY_CARE_PROVIDER_SITE_OTHER): Payer: Self-pay | Admitting: Family Medicine

## 2019-02-19 NOTE — Telephone Encounter (Signed)
Please review

## 2019-02-20 ENCOUNTER — Telehealth: Payer: Self-pay | Admitting: Family Medicine

## 2019-02-20 ENCOUNTER — Ambulatory Visit (INDEPENDENT_AMBULATORY_CARE_PROVIDER_SITE_OTHER): Payer: 59 | Admitting: Family Medicine

## 2019-02-20 DIAGNOSIS — F439 Reaction to severe stress, unspecified: Secondary | ICD-10-CM | POA: Diagnosis not present

## 2019-02-20 NOTE — Telephone Encounter (Signed)
Spoke with patient who states that she did doxy visit with Dr. Jerline Pain this morn 12/1 and he will fill out FMLA ppw.

## 2019-02-20 NOTE — Progress Notes (Signed)
   Chief Complaint:  Brenda Frost is a 53 y.o. female who presents today for a virtual office visit with a chief complaint of stress.   Assessment/Plan:  Stress FMLA extended. Discussed referral to behavioral health - declined for the time being.     Subjective:  HPI:  Patient has been out of work on [employer] since 12/31/2018 helping her daughter recover from stem cell transplant.  She has been under increased stress with this.  Feels like she has been handling it well.  She was originally scheduled to go back on 12/11 however does not think she will be ready to go back by then.  She would like To have FMLA extended for another 10 days.  ROS: Per HPI  PMH: She reports that she has never smoked. She has never used smokeless tobacco. She reports current alcohol use of about 1.0 standard drinks of alcohol per week. She reports that she does not use drugs.      Objective/Observations  Physical Exam: Gen: NAD, resting comfortably Pulm: Normal work of breathing Neuro: Grossly normal, moves all extremities Psych: Normal affect and thought content  No results found for this or any previous visit (from the past 24 hour(s)).   Virtual Visit via Video   I connected with Brenda Frost on 02/20/19 at 11:00 AM EST by a video enabled telemedicine application and verified that I am speaking with the correct person using two identifiers. I discussed the limitations of evaluation and management by telemedicine and the availability of in person appointments. The patient expressed understanding and agreed to proceed.   Patient location: Home Provider location: [place] participating in the virtual visit: Myself and Patient     Algis Greenhouse. Jerline Pain, MD 02/20/2019 11:01 AM

## 2019-02-20 NOTE — Telephone Encounter (Signed)
Received FMLA paperwork for her under Dr. Juleen China. Dr. Juleen China is no longer in our clinic. I can not fill these out without seeing her. Can she please make appointment? Virtual is fine for this..   Thanks,  Dr. Rogers Blocker

## 2019-02-20 NOTE — Telephone Encounter (Signed)
Tried calling patient x3 and there seemed to be trouble with phone line.  Left vm message from patient requesting a call back.

## 2019-03-02 ENCOUNTER — Encounter: Payer: Self-pay | Admitting: Family Medicine

## 2019-03-07 ENCOUNTER — Telehealth: Payer: Self-pay

## 2019-03-07 NOTE — Telephone Encounter (Signed)
Copied from [place] [id number]. Topic: General - Inquiry >> Mar 07, 2019  1:43 PM Reyne Dumas L wrote: Reason for CRM:   Pt wants to extend her FMLA for 2 weeks and would like to have a return to work date of March 30, 2019. Pt wants to know if this is possible.

## 2019-03-08 ENCOUNTER — Encounter: Payer: Self-pay | Admitting: Family Medicine

## 2019-03-08 NOTE — Telephone Encounter (Signed)
Yes that is ok.  Brenda Frost. Jerline Pain, MD 03/08/2019 12:03 PM

## 2019-03-08 NOTE — Telephone Encounter (Signed)
Letter sent via my chart

## 2019-03-08 NOTE — Telephone Encounter (Signed)
Please advise 

## 2019-03-20 ENCOUNTER — Other Ambulatory Visit: Payer: Self-pay

## 2019-03-21 ENCOUNTER — Ambulatory Visit (INDEPENDENT_AMBULATORY_CARE_PROVIDER_SITE_OTHER): Payer: 59 | Admitting: Family Medicine

## 2019-03-21 ENCOUNTER — Encounter: Payer: Self-pay | Admitting: Family Medicine

## 2019-03-21 VITALS — BP 116/64 | HR 75 | Temp 98.4°F | Ht 65.0 in | Wt 187.0 lb

## 2019-03-21 DIAGNOSIS — E038 Other specified hypothyroidism: Secondary | ICD-10-CM | POA: Diagnosis not present

## 2019-03-21 DIAGNOSIS — R1010 Upper abdominal pain, unspecified: Secondary | ICD-10-CM

## 2019-03-21 DIAGNOSIS — E7849 Other hyperlipidemia: Secondary | ICD-10-CM | POA: Diagnosis not present

## 2019-03-21 LAB — CBC WITH DIFFERENTIAL/PLATELET
Basophils Absolute: 0 10*3/uL (ref 0.0–0.1)
Basophils Relative: 0.3 % (ref 0.0–3.0)
Eosinophils Absolute: 0.1 10*3/uL (ref 0.0–0.7)
Eosinophils Relative: 2 % (ref 0.0–5.0)
HCT: 42.8 % (ref 36.0–46.0)
Hemoglobin: 14 g/dL (ref 12.0–15.0)
Lymphocytes Relative: 30.3 % (ref 12.0–46.0)
Lymphs Abs: 1.8 10*3/uL (ref 0.7–4.0)
MCHC: 32.6 g/dL (ref 30.0–36.0)
MCV: 89.2 fl (ref 78.0–100.0)
Monocytes Absolute: 0.5 10*3/uL (ref 0.1–1.0)
Monocytes Relative: 8.3 % (ref 3.0–12.0)
Neutro Abs: 3.4 10*3/uL (ref 1.4–7.7)
Neutrophils Relative %: 59.1 % (ref 43.0–77.0)
Platelets: 192 10*3/uL (ref 150.0–400.0)
RBC: 4.8 Mil/uL (ref 3.87–5.11)
RDW: 14.2 % (ref 11.5–15.5)
WBC: 5.8 10*3/uL (ref 4.0–10.5)

## 2019-03-21 LAB — COMPREHENSIVE METABOLIC PANEL
ALT: 11 U/L (ref 0–35)
AST: 14 U/L (ref 0–37)
Albumin: 4 g/dL (ref 3.5–5.2)
Alkaline Phosphatase: 118 U/L — ABNORMAL HIGH (ref 39–117)
BUN: 19 mg/dL (ref 6–23)
CO2: 28 mEq/L (ref 19–32)
Calcium: 9.9 mg/dL (ref 8.4–10.5)
Chloride: 105 mEq/L (ref 96–112)
Creatinine, Ser: 0.77 mg/dL (ref 0.40–1.20)
GFR: 78.55 mL/min (ref >60.00)
Glucose, Bld: 107 mg/dL — ABNORMAL HIGH (ref 70–99)
Potassium: 4.4 mEq/L (ref 3.5–5.1)
Sodium: 141 mEq/L (ref 135–145)
Total Bilirubin: 0.7 mg/dL (ref 0.2–1.2)
Total Protein: 6.8 g/dL (ref 6.0–8.3)

## 2019-03-21 LAB — TSH: TSH: 0.77 u[IU]/mL (ref 0.35–4.50)

## 2019-03-21 LAB — HIGH SENSITIVITY CRP: CRP, High Sensitivity: 4.08 mg/L (ref 0.000–5.000)

## 2019-03-21 LAB — T4, FREE: Free T4: 1.33 ng/dL (ref 0.60–1.60)

## 2019-03-21 LAB — LIPASE: Lipase: 40 U/L (ref 11.0–59.0)

## 2019-03-21 LAB — AMYLASE: Amylase: 43 U/L (ref 27–131)

## 2019-03-21 NOTE — Patient Instructions (Signed)
Checking labs, i'll refill thyroid once I get your labs back tomorrow.   -they will call you with referral for lipid clinic and thyroid ultrasound.   So nice to meet you! Here's to 2021  Dr. Rogers Blocker

## 2019-03-21 NOTE — Progress Notes (Signed)
Patient: Brenda Frost MRN: [id number] DOB: 05/19/1965 PCP: Orma Flaming, MD     Subjective:  Chief Complaint  Patient presents with  . Transitions Of Care  . thyroid follow up    HPI: The patient is a 53 y.o. female who presents today for transition of care from Dr. Juleen China.  Concerned about her thyroid. She is due for labs for this. Dose was changed after abnormal labs in 11/2018.   Hypothyroidism: She is currently on 11mcg. It was decreased back in October. She feels like her thyroid may still be out of whack, but she isn't sure if it's due to all of her stress. She also feels like her neck/thyroid area looks like it is bulging ?goiter is developing. She has had no issues swallowing, vocal changes, weight changes or other symptoms of thyroid disease.   Hyperlipidemia- her LDL was over 200 and total cholesterol was 302 in 11/2018. Her risk is low, but her numbers are quite high. She states she has had cholesterol issues since she was 53 years of age. She has never been on medication. +hx of prediabetes. Does not smoke. Has seen cardiology in 2014 with work up for upper abdominal pain.   The 10-year ASCVD risk score Mikey Bussing DC Jr., et al., 2013) is: 1.2%  Review of Systems  Constitutional: Negative for fatigue.  Eyes: Negative for visual disturbance.  Respiratory: Negative for shortness of breath.   Cardiovascular: Negative for chest pain, palpitations and leg swelling.  Gastrointestinal: Negative for abdominal pain, diarrhea, nausea and vomiting.  Endocrine: Negative for cold intolerance and heat intolerance.  Neurological: Negative for dizziness and headaches.  Psychiatric/Behavioral: Negative for sleep disturbance.    Allergies Patient has No Known Allergies.  Past Medical History Patient  has a past medical history of Hyperlipidemia and Hypothyroidism.  Surgical History Patient  has a past surgical history that includes Cesarean section and ORIF elbow fracture (Right,  02/15/2016).  Family History Pateint's family history includes Diabetes in her mother; Heart disease in her maternal grandmother; Hyperlipidemia in her mother; Hypertension in her mother; Kidney disease in her mother.  Social History Patient  reports that she has never smoked. She has never used smokeless tobacco. She reports current alcohol use of about 1.0 standard drinks of alcohol per week. She reports that she does not use drugs.    Objective: Vitals:   03/21/19 0834  BP: 116/64  Pulse: 75  Temp: 98.4 F (36.9 C)  TempSrc: Skin  SpO2: 96%  Weight: 84.8 kg  Height: 5\' 5"  (1.651 m)    Body mass index is 31.12 kg/m.  Physical Exam Vitals reviewed.  Constitutional:      Appearance: Normal appearance.  HENT:     Head: Normocephalic and atraumatic.     Right Ear: Tympanic membrane, ear canal and external ear normal.     Left Ear: Tympanic membrane, ear canal and external ear normal.     Mouth/Throat:     Mouth: Mucous membranes are moist.  Eyes:     Extraocular Movements: Extraocular movements intact.     Conjunctiva/sclera: Conjunctivae normal.     Pupils: Pupils are equal, round, and reactive to light.  Neck:     Comments: No palpable goiter.  Cardiovascular:     Rate and Rhythm: Normal rate and regular rhythm.     Heart sounds: Normal heart sounds.  Pulmonary:     Effort: Pulmonary effort is normal.     Breath sounds: Normal breath sounds.  Abdominal:  General: Abdomen is flat. Bowel sounds are normal.     Palpations: Abdomen is soft.  Musculoskeletal:     Cervical back: Normal range of motion and neck supple.  Neurological:     General: No focal deficit present.     Mental Status: She is alert and oriented to person, place, and time.  Psychiatric:        Mood and Affect: Mood normal.        Behavior: Behavior normal.        Assessment/plan: 1. Other specified hypothyroidism Abnormal TSH and adjusted medication back in October. Due for recheck. I do  not feel a goiter, but family states she has changes in her neck. Will check ultrasound.  - TSH - T4, free - US THYROID; Future  2. Pain of upper abdomen Followed by GI. Make sure no luring pancreatitis.  - Comprehensive metabolic panel - CBC with Differential/Platelet - Lipase - Amylase  3. Other hyperlipidemia Extremely elevated cholesterol. Discussed even though risk is low, I tend to treat such high numbers. Will check high sensitivity CRP, refer to lipids clinic and likely start her on statin.  - High sensitivity CRP - AMB Referral to [facility]   This visit occurred during the SARS-CoV-2 public health emergency.  Safety protocols were in place, including screening questions prior to the visit, additional usage of staff PPE, and extensive cleaning of exam room while observing appropriate contact time as indicated for disinfecting solutions.    Return if symptoms worsen or fail to improve.   Orma Flaming, MD [place]   03/21/2019

## 2019-03-22 ENCOUNTER — Other Ambulatory Visit: Payer: Self-pay | Admitting: Family Medicine

## 2019-03-22 DIAGNOSIS — E782 Mixed hyperlipidemia: Secondary | ICD-10-CM

## 2019-03-22 MED ORDER — LEVOTHYROXINE SODIUM 75 MCG PO TABS: 75.0000 ug | ORAL_TABLET | Freq: Every day | ORAL | 3 refills | Status: DC

## 2019-03-22 MED ORDER — ROSUVASTATIN CALCIUM 10 MG PO TABS: 10.0000 mg | ORAL_TABLET | Freq: Every day | ORAL | 3 refills | Status: DC

## 2019-03-22 MED FILL — LEVOTHYROXINE SODIUM 75 MCG: 75 | 90 days supply | Qty: 90 | Fill #0

## 2019-03-22 MED FILL — ROSUVASTATIN CALCIUM 10 MG: 10 | 90 days supply | Qty: 90 | Fill #0

## 2019-04-06 ENCOUNTER — Ambulatory Visit (HOSPITAL_COMMUNITY)
Admission: RE | Admit: 2019-04-06 | Discharge: 2019-04-06 | Disposition: A | Discharge status: Home / Self Care | Payer: 59 | Source: Ambulatory Visit | Attending: Family Medicine | Admitting: Family Medicine

## 2019-04-06 ENCOUNTER — Other Ambulatory Visit: Payer: Self-pay

## 2019-04-06 DIAGNOSIS — E038 Other specified hypothyroidism: Secondary | ICD-10-CM | POA: Insufficient documentation

## 2019-04-06 DIAGNOSIS — E049 Nontoxic goiter, unspecified: Secondary | ICD-10-CM | POA: Diagnosis not present

## 2019-04-30 ENCOUNTER — Other Ambulatory Visit: Payer: Self-pay

## 2019-04-30 ENCOUNTER — Encounter: Payer: Self-pay | Admitting: Internal Medicine

## 2019-04-30 ENCOUNTER — Ambulatory Visit (INDEPENDENT_AMBULATORY_CARE_PROVIDER_SITE_OTHER): Payer: 59 | Admitting: Internal Medicine

## 2019-04-30 VITALS — BP 127/82 | HR 73 | Ht 66.0 in | Wt 188.0 lb

## 2019-04-30 DIAGNOSIS — E7849 Other hyperlipidemia: Secondary | ICD-10-CM

## 2019-04-30 NOTE — Patient Instructions (Signed)
Medication Instructions:  Your physician recommends that you continue on your current medications as directed. Please refer to the Current Medication list given to you today.  If you need a refill on your cardiac medications before your next appointment, please call your pharmacy.   Lab work: NONE If you have labs (blood work) drawn today and your tests are completely normal, you will receive your results only by: [place] (if you have MyChart) OR A paper copy in the mail If you have any lab test that is abnormal or we need to change your treatment, we will call you to review the results.  Testing/Procedures: Coronary Calcium Score  Follow-Up: At [facility], you and your health needs are our priority.  As part of our continuing mission to provide you with exceptional heart care, we have created designated Provider Care Teams.  These Care Teams include your primary Cardiologist (physician) and Advanced Practice Providers (APPs -  Physician Assistants and Nurse Practitioners) who all work together to provide you with the care you need, when you need it. You may see Dr. Debara Pickett or one of the following Advanced Practice Providers on your designated Care Team:    Almyra Deforest, PA-C  Fabian Sharp, [place] or   Roby Lofts, [place]  Your physician wants you to follow-up in: 3 months virtually with Dr. Debara Pickett  Any Other Special Instructions Will Be Listed Below (If Applicable).  Coronary Calcium Scan A coronary calcium scan is an imaging test used to look for deposits of plaque in the inner lining of the blood vessels of the heart (coronary arteries). Plaque is made up of calcium, protein, and fatty substances. These deposits of plaque can partly clog and narrow the coronary arteries without producing any symptoms or warning signs. This puts a person at risk for a heart attack. This test is recommended for people who are at moderate risk for heart disease. The test can find plaque deposits  before symptoms develop. Tell a health care provider about:  Any allergies you have.  All medicines you are taking, including vitamins, herbs, eye drops, creams, and over-the-counter medicines.  Any problems you or family members have had with anesthetic medicines.  Any blood disorders you have.  Any surgeries you have had.  Any medical conditions you have.  Whether you are pregnant or may be pregnant. What are the risks? Generally, this is a safe procedure. However, problems may occur, including:  Harm to a pregnant woman and her unborn baby. This test involves the use of radiation. Radiation exposure can be dangerous to a pregnant woman and her unborn baby. If you are pregnant or think you may be pregnant, you should not have this procedure done.  Slight increase in the risk of cancer. This is because of the radiation involved in the test. What happens before the procedure? Ask your health care provider for any specific instructions on how to prepare for this procedure. You may be asked to avoid products that contain caffeine, tobacco, or nicotine for 4 hours before the procedure. What happens during the procedure?   You will undress and remove any jewelry from your neck or chest.  You will put on a hospital gown.  Sticky electrodes will be placed on your chest. The electrodes will be connected to an electrocardiogram (ECG) machine to record a tracing of the electrical activity of your heart.  You will lie down on a curved bed that is attached to the Arab.  You may be given  medicine to slow down your heart rate so that clear pictures can be created.  You will be moved into the CT scanner, and the CT scanner will take pictures of your heart. During this time, you will be asked to lie still and hold your breath for 2-3 seconds at a time while each picture of your heart is being taken. The procedure may vary among health care providers and hospitals. What happens after the  procedure?  You can get dressed.  You can return to your normal activities.  It is up to you to get the results of your procedure. Ask your health care provider, or the department that is doing the procedure, when your results will be ready. Summary  A coronary calcium scan is an imaging test used to look for deposits of plaque in the inner lining of the blood vessels of the heart (coronary arteries). Plaque is made up of calcium, protein, and fatty substances.  Generally, this is a safe procedure. Tell your health care provider if you are pregnant or may be pregnant.  Ask your health care provider for any specific instructions on how to prepare for this procedure.  A CT scanner will take pictures of your heart.  You can return to your normal activities after the scan is done. This information is not intended to replace advice given to you by your health care provider. Make sure you discuss any questions you have with your health care provider. Document Revised: 09/26/2018 Document Reviewed: 09/26/2018 Elsevier Patient Education  [place].

## 2019-04-30 NOTE — Progress Notes (Signed)
LIPID CLINIC CONSULT NOTE  Chief Complaint:  Dyslipidemia  Primary Care Physician: Orma Flaming, MD  Primary Cardiologist:  No primary care provider on file.  HPI:  Brenda Frost is a 54 y.o. female who is being seen today for the evaluation of dyslipidemia at the request of Orma Flaming, MD. 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 She is of [place] ancestry and has been referred for evaluation of marked dyslipidemia. More recently her lipids have demonstrated significant elevation in total cholesterol at 307, triglycerides 67, HDL 90 and LDL of 203. She reports that this is actually been her lipid profile since her teenage years when she first had her cholesterol tested. She has no history of cardiovascular events and it sounds like no significant family history of coronary disease. She is not aware of either of her parents having abnormal cholesterol although these numbers are suggestive of familial hyperlipidemia. She has not been previously on any therapy but was prescribed rosuvastatin 10 mg daily. She has not started the medication yet pending this visit. Currently she is asymptomatic, denying chest pain or worsening shortness of breath. She has some issues with pancreatitis and/or abdominal discomfort which she seems to be managing with diet.  PMHx:  Past Medical History:  Diagnosis Date  . Hyperlipidemia   . Hypothyroidism     Past Surgical History:  Procedure Laterality Date  . CESAREAN SECTION    . ORIF ELBOW FRACTURE Right 02/15/2016   Procedure: OPEN REDUCTION INTERNAL FIXATION (ORIF) ELBOW/OLECRANON FRACTURE;  Surgeon: Iran Planas, MD;  Location: [facility];  Service: Orthopedics;  Laterality: Right;    FAMHx:  Family History  Problem Relation Age of Onset  . Diabetes Mother   . Hyperlipidemia Mother   . Hypertension Mother   . Kidney disease Mother   . Heart disease Maternal Grandmother   . CAD Neg Hx   .  Breast cancer Neg Hx   . Colon cancer Neg Hx   . Esophageal cancer Neg Hx   . Inflammatory bowel disease Neg Hx   . Liver disease Neg Hx   . Pancreatic cancer Neg Hx   . Rectal cancer Neg Hx   . Stomach cancer Neg Hx     SOCHx:   reports that she has never smoked. She has never used smokeless tobacco. She reports current alcohol use of about 1.0 standard drinks of alcohol per week. She reports that she does not use drugs.  ALLERGIES:  No Known Allergies  ROS: Pertinent items noted in HPI and remainder of comprehensive ROS otherwise negative.  HOME MEDS: Current Outpatient Medications on File Prior to Visit  Medication Sig Dispense Refill  . levothyroxine (SYNTHROID) 75 MCG tablet Take 1 tablet (75 mcg total) by mouth daily. 90 tablet 3  . rosuvastatin (CRESTOR) 10 MG tablet Take 1 tablet (10 mg total) by mouth daily. 90 tablet 3   No current facility-administered medications on file prior to visit.    LABS/IMAGING: No results found for this or any previous visit (from the past 48 hour(s)). No results found.  LIPID PANEL:    Component Value Date/Time   CHOL 307 (H) 12/20/2018 1013   CHOL 293 (H) 12/08/2017 0820   TRIG 67.0 12/20/2018 1013   HDL 90.60 12/20/2018 1013   HDL 86 12/08/2017 0820   CHOLHDL 3 12/20/2018 1013   VLDL 13.4 12/20/2018 1013   LDLCALC 203 (H) 12/20/2018 1013   LDLCALC 192 (  H) 12/08/2017 0820    WEIGHTS: Wt Readings from Last 3 Encounters:  04/30/19 188 lb (85.3 kg)  03/21/19 187 lb (84.8 kg)  12/29/18 188 lb 6 oz (85.4 kg)    VITALS: BP 127/82   Pulse 73   Ht 5\' 6"  (1.676 m)   Wt 188 lb (85.3 kg)   LMP 04/25/2016   SpO2 99%   BMI 30.34 kg/m   EXAM: General appearance: alert and no distress Neck: no carotid bruit, no JVD, thyroid not enlarged, symmetric, no tenderness/mass/nodules and No corneal arcus Lungs: clear to auscultation bilaterally Heart: regular rate and rhythm, S1, S2 normal, no murmur, click, rub or  gallop Extremities: extremities normal, atraumatic, no cyanosis or edema and No tendon xanthomas Neurologic: Grossly normal  EKG: Deferred  ASSESSMENT: 1. Probable familial hyperlipidemia  PLAN: 1.   [name] probably has a familial hyperlipidemia although has not had any early onset events. This can actually sometimes be seen with certain APO B mutations that actually are not associated with increased coronary risk. We could better restratify her by coronary artery calcium scoring. I would recommend we get this test and consider treatment options based on that. If there is significant calcification or extensive coronary disease, more aggressive therapy is warranted and may wish to pursue genetic testing. If there is no significant finding, then it is likely that her dyslipidemia is not necessarily pathogenic and perhaps her elevated HDL cholesterol is functional, lowering her risk of coronary disease. I still agree with current guideline recommendations which is a 50% reduction in LDL cholesterol with any patient that has LDL over 190. That would likely necessitate increasing her to 20 mg of rosuvastatin.  Thanks again for the kind referral.  Pixie Casino, MD, FACC, [place] Director of the [facility]  [facility] Diplomate of the American Board of Clinical Lipidology Attending Cardiologist  Direct Dial: [phone]  Fax: [phone]  Website:  [email or web address] [email or web address] 04/30/2019, 1:00 PM

## 2019-05-16 ENCOUNTER — Other Ambulatory Visit: Payer: Self-pay

## 2019-05-16 ENCOUNTER — Ambulatory Visit (INDEPENDENT_AMBULATORY_CARE_PROVIDER_SITE_OTHER)
Admission: RE | Admit: 2019-05-16 | Discharge: 2019-05-16 | Disposition: A | Discharge status: Home / Self Care | Payer: Self-pay | Source: Ambulatory Visit | Attending: Internal Medicine | Admitting: Internal Medicine

## 2019-05-16 DIAGNOSIS — E7849 Other hyperlipidemia: Secondary | ICD-10-CM

## 2019-05-18 ENCOUNTER — Other Ambulatory Visit: Payer: Self-pay | Admitting: *Deleted

## 2019-05-18 DIAGNOSIS — E7849 Other hyperlipidemia: Secondary | ICD-10-CM

## 2019-05-18 MED ORDER — ROSUVASTATIN CALCIUM 20 MG PO TABS: 20.0000 mg | ORAL_TABLET | Freq: Every day | ORAL | 1 refills | Status: DC

## 2019-07-02 DIAGNOSIS — Z9484 Stem cells transplant status: Secondary | ICD-10-CM | POA: Diagnosis not present

## 2019-07-02 DIAGNOSIS — R21 Rash and other nonspecific skin eruption: Secondary | ICD-10-CM | POA: Diagnosis not present

## 2019-07-02 DIAGNOSIS — L304 Erythema intertrigo: Secondary | ICD-10-CM | POA: Diagnosis not present

## 2019-07-02 DIAGNOSIS — L309 Dermatitis, unspecified: Secondary | ICD-10-CM | POA: Diagnosis not present

## 2019-07-02 DIAGNOSIS — L271 Localized skin eruption due to drugs and medicaments taken internally: Secondary | ICD-10-CM | POA: Diagnosis not present

## 2019-07-06 ENCOUNTER — Other Ambulatory Visit: Payer: Self-pay

## 2019-07-06 ENCOUNTER — Ambulatory Visit (INDEPENDENT_AMBULATORY_CARE_PROVIDER_SITE_OTHER): Payer: 59 | Admitting: Family Medicine

## 2019-07-06 ENCOUNTER — Encounter: Payer: Self-pay | Admitting: Family Medicine

## 2019-07-06 VITALS — BP 116/80 | HR 78 | Temp 97.4°F | Ht 66.0 in | Wt 189.8 lb

## 2019-07-06 DIAGNOSIS — R8290 Unspecified abnormal findings in urine: Secondary | ICD-10-CM

## 2019-07-06 LAB — POCT URINALYSIS DIPSTICK
Bilirubin, UA: NEGATIVE
Blood, UA: NEGATIVE
Glucose, UA: NEGATIVE
Ketones, UA: NEGATIVE
Leukocytes, UA: NEGATIVE
Nitrite, UA: NEGATIVE
Protein, UA: NEGATIVE
Spec Grav, UA: >=1.03 — AB (ref 1.010–1.025)
Urobilinogen, UA: 0.2 E.U./dL
pH, UA: 6 (ref 5.0–8.0)

## 2019-07-06 NOTE — Progress Notes (Signed)
Patient: Brenda Frost MRN: [id number] DOB: 04/09/1965 PCP: Orma Flaming, MD     Subjective:  Chief Complaint  Patient presents with  . Urinary Tract Infection    Possible    HPI: The patient is a 54 y.o. female who presents today for possible UTI. She states she went to the restroom and smelled a strong odor of ammonia. She states it was so strong and happened twice. She wanted to get it checked out. Denies any dysuria, urgency or frequency. Has had problems emptying bladder as she feels like she has to go and then only has a few drops. No CVA tenderness. No fever/chills. No blood in urine. No vaginal discharge and denies odor to this. Her urine is clear. She hasn't drank much water. She has started to eat a protein bar once a day about one week ago.   Review of Systems  Constitutional: Negative for chills and fever.  Genitourinary: Positive for difficulty urinating and frequency. Negative for dysuria, flank pain, hematuria and pelvic pain.       Patient mentioned that when she does go to the restroom that there's only a drop at a time. She also mentioned that she noticed a odor with urination that start today.   Skin: Negative for rash.    Allergies Patient has No Known Allergies.  Past Medical History Patient  has a past medical history of Hyperlipidemia and Hypothyroidism.  Surgical History Patient  has a past surgical history that includes Cesarean section and ORIF elbow fracture (Right, 02/15/2016).  Family History Pateint's family history includes Diabetes in her mother; Heart disease in her maternal grandmother; Hyperlipidemia in her mother; Hypertension in her mother; Kidney disease in her mother.  Social History Patient  reports that she has never smoked. She has never used smokeless tobacco. She reports current alcohol use of about 1.0 standard drinks of alcohol per week. She reports that she does not use drugs.    Objective: Vitals:   07/06/19 1445  BP:  116/80  Pulse: 78  Temp: (!) 97.4 F (36.3 C)  TempSrc: Temporal  SpO2: 98%  Weight: 189 lb 12.8 oz (86.1 kg)  Height: 5\' 6"  (1.676 m)    Body mass index is 30.63 kg/m.  Physical Exam Vitals reviewed.  Constitutional:      Appearance: Normal appearance.  HENT:     Head: Normocephalic and atraumatic.  Cardiovascular:     Rate and Rhythm: Normal rate and regular rhythm.     Heart sounds: Normal heart sounds.  Pulmonary:     Effort: Pulmonary effort is normal.     Breath sounds: Normal breath sounds.  Abdominal:     General: Abdomen is flat. Bowel sounds are normal.     Palpations: Abdomen is soft.     Tenderness: There is no abdominal tenderness. There is no right CVA tenderness or left CVA tenderness.  Skin:    General: Skin is warm.     Capillary Refill: Capillary refill takes less than 2 seconds.  Neurological:     General: No focal deficit present.     Mental Status: She is alert and oriented to person, place, and time.   UA: normal      Assessment/plan: 1. Abnormal urine odor UA normal and discussed ammonia smell Is usually harmless if infection ruled out. Encouraged her to stop protein bars as high protein can contribute to smell and increase water intake. Will f/u on culture.  - POCT Urinalysis Dipstick - Urine Culture; Future -  Urine Culture   This visit occurred during the SARS-CoV-2 public health emergency.  Safety protocols were in place, including screening questions prior to the visit, additional usage of staff PPE, and extensive cleaning of exam room while observing appropriate contact time as indicated for disinfecting solutions.    Return if symptoms worsen or fail to improve.   Orma Flaming, MD [place]   07/06/2019

## 2019-07-07 LAB — URINE CULTURE
MICRO NUMBER:: 10373124
Result:: NO GROWTH
SPECIMEN QUALITY:: ADEQUATE

## 2019-07-12 DIAGNOSIS — C8198 Hodgkin lymphoma, unspecified, lymph nodes of multiple sites: Secondary | ICD-10-CM | POA: Diagnosis not present

## 2019-07-12 DIAGNOSIS — Z9484 Stem cells transplant status: Secondary | ICD-10-CM | POA: Diagnosis not present

## 2019-07-12 DIAGNOSIS — R1111 Vomiting without nausea: Secondary | ICD-10-CM | POA: Diagnosis not present

## 2019-07-12 DIAGNOSIS — R0981 Nasal congestion: Secondary | ICD-10-CM | POA: Diagnosis not present

## 2019-07-12 MED FILL — LEVOTHYROXINE SODIUM 75 MCG: 75 | 30 days supply | Qty: 30 | Fill #1

## 2019-07-14 DIAGNOSIS — J984 Other disorders of lung: Secondary | ICD-10-CM | POA: Diagnosis not present

## 2019-07-30 ENCOUNTER — Telehealth: Payer: 59 | Admitting: Internal Medicine

## 2019-08-13 MED FILL — LEVOTHYROXINE SODIUM 75 MCG: 75 | 30 days supply | Qty: 30 | Fill #2

## 2019-09-18 IMAGING — MG DIGITAL DIAGNOSTIC UNILATERAL LEFT MAMMOGRAM WITH TOMO AND CAD
8 series · 8 of 24 positions shown · non-contrast
Comparison: Previous exam(s).

CLINICAL DATA: Patient recalled from screening for left breast
mass.

EXAM:
DIGITAL DIAGNOSTIC LEFT MAMMOGRAM WITH CAD AND TOMO
ULTRASOUND LEFT BREAST

[L MLO synth-2D]
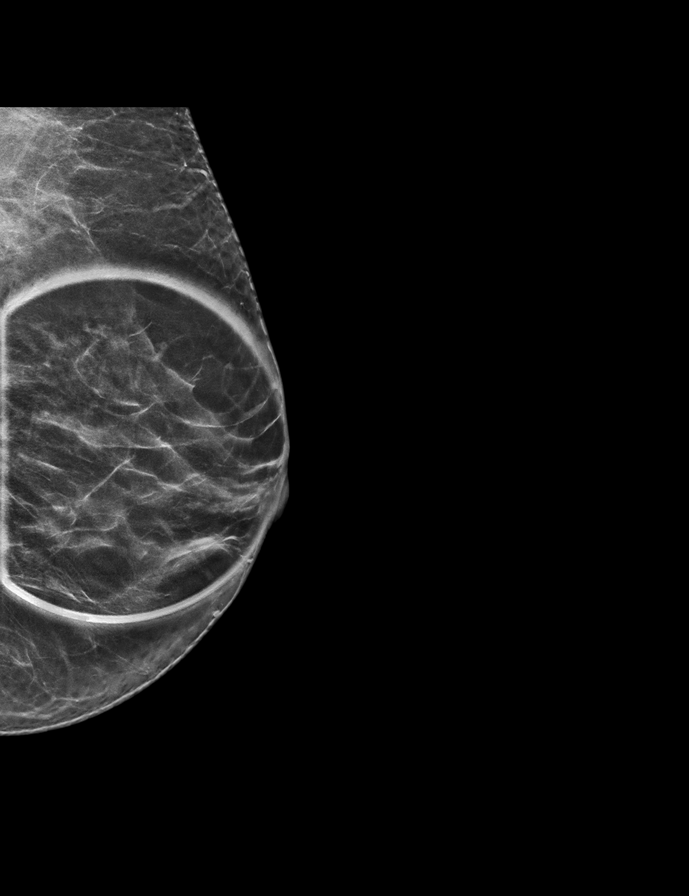

[L CC synth-2D (1 of 2)]
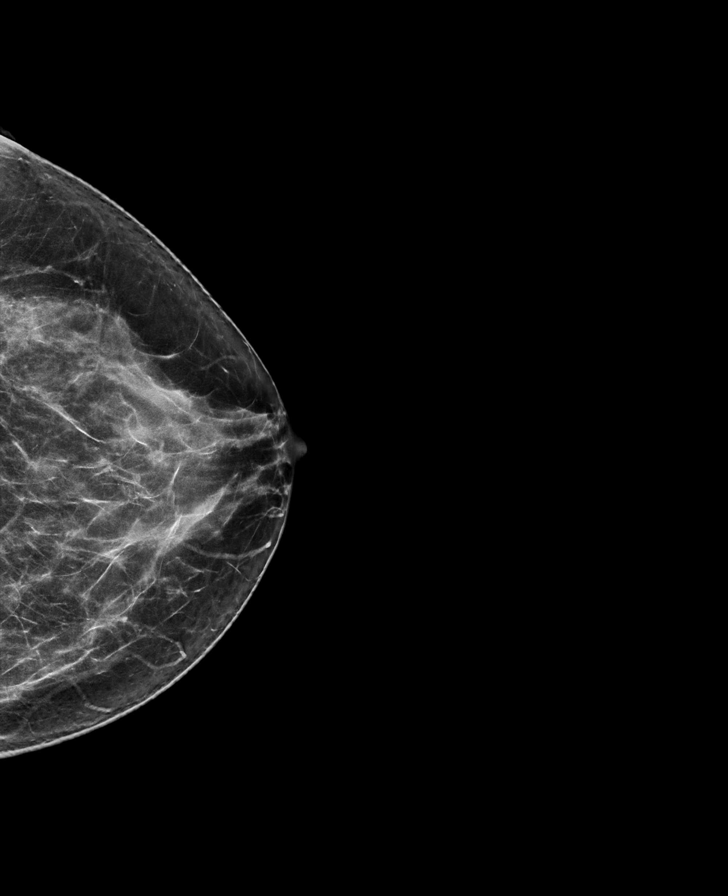

[L CC synth-2D (2 of 2)]
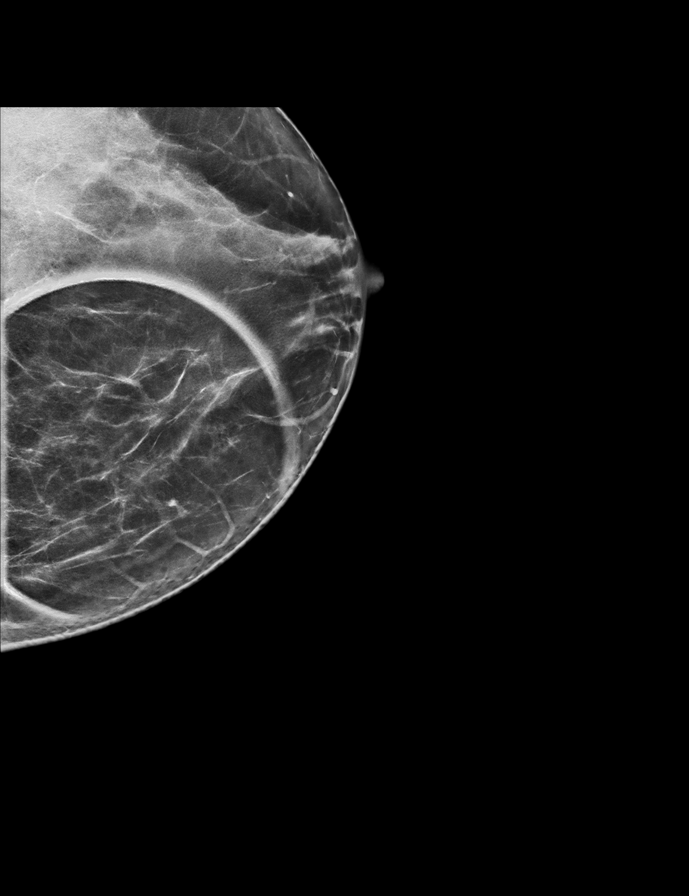

[L ML synth-2D]
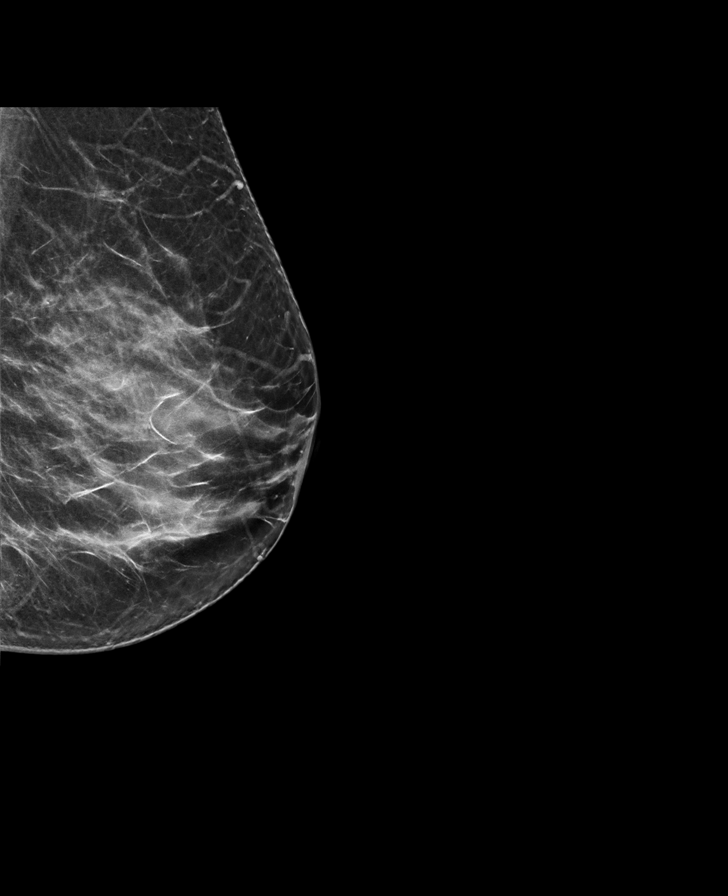

[L CC tomo (1 of 2) · tomo slice 37/73.0]
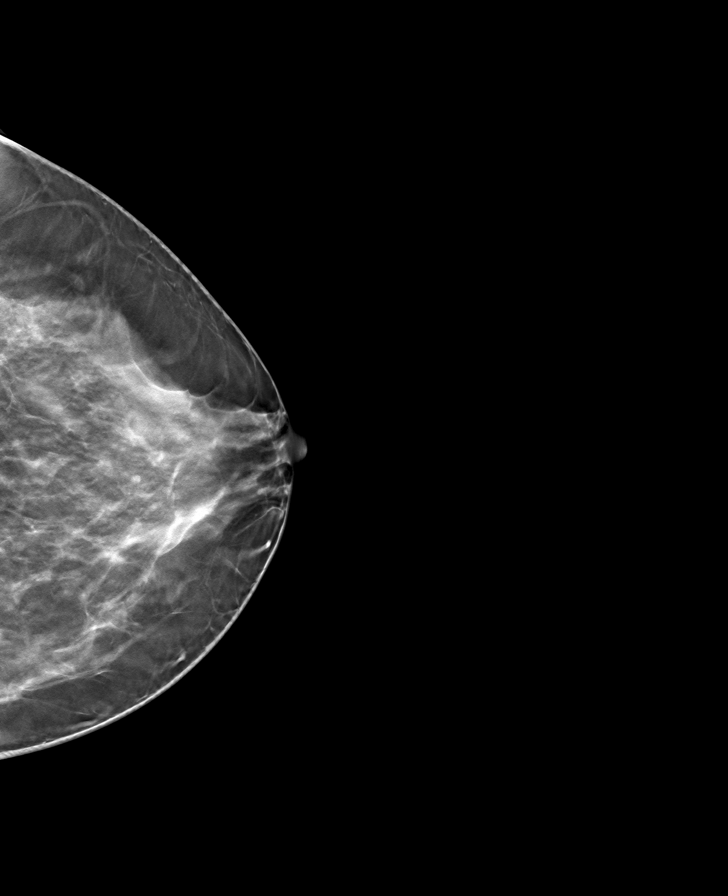

[L ML tomo · tomo slice 37/74.0]
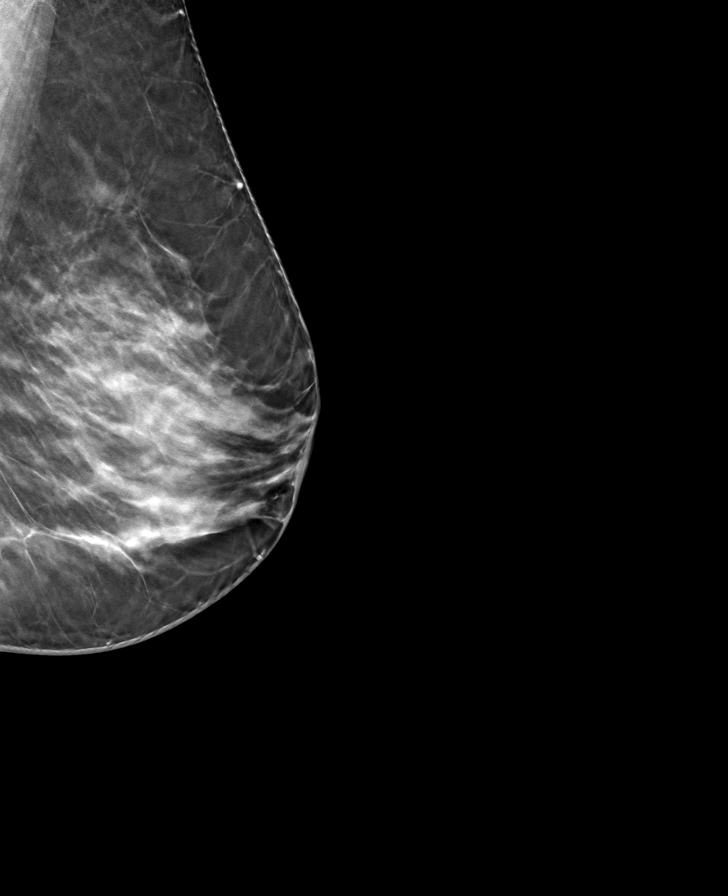

[L CC tomo (2 of 2) · tomo slice 38/75.0]
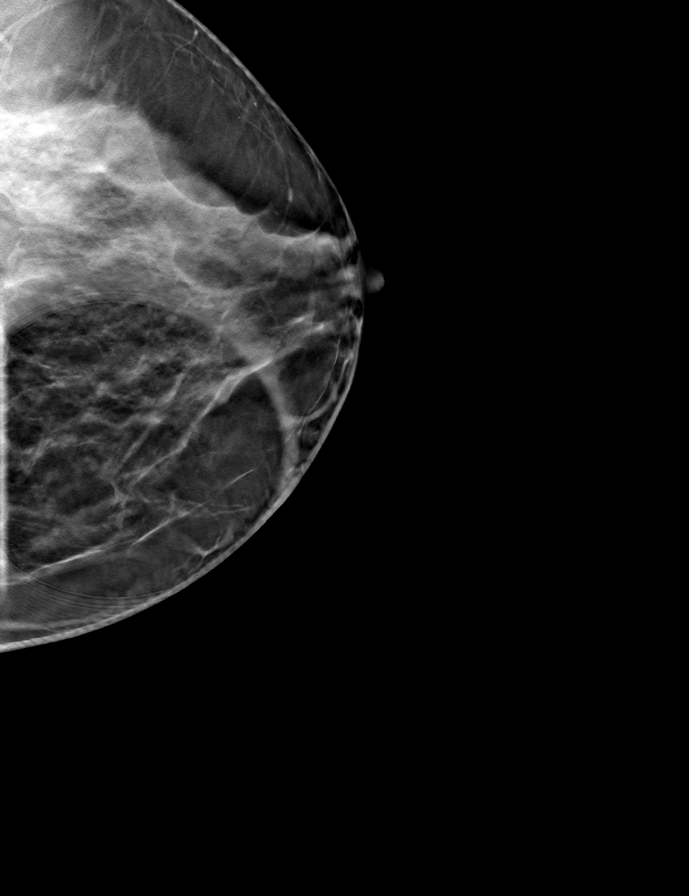

[L MLO tomo · tomo slice 35/70.0]
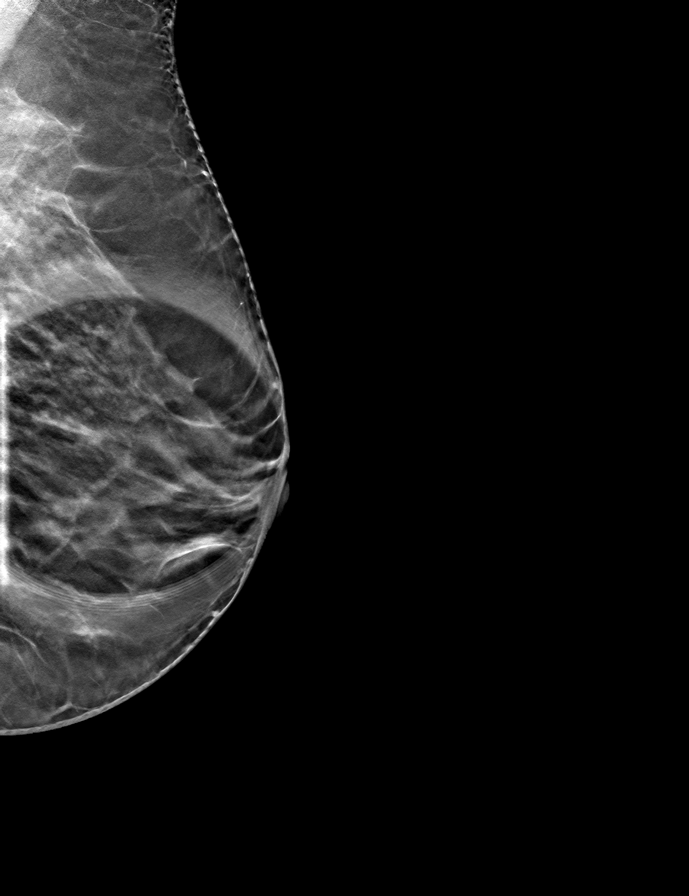

[8 of 24 positions shown; findings below may reference images not displayed]

ACR Breast Density Category c: The breast tissue is heterogeneously
dense, which may obscure small masses.
FINDINGS: Questioned mass within the medial left breast appeared to resolve
with additional imaging including spot tomosynthesis images.

Mammographic images were processed with CAD.

Targeted ultrasound is performed, showing no discrete mass within
the medial left breast.
IMPRESSION: No mammographic evidence for malignancy.

RECOMMENDATION:
Screening mammogram in one year.(Code:47-7-ZXD)

I have discussed the findings and recommendations with the patient.
Results were also provided in writing at the conclusion of the
visit. If applicable, a reminder letter will be sent to the patient
regarding the next appointment.

BI-RADS CATEGORY  1: Negative.

## 2019-09-25 MED FILL — LEVOTHYROXINE SODIUM 75 MCG: 75 | 30 days supply | Qty: 30 | Fill #3

## 2019-10-11 DIAGNOSIS — T451X5A Adverse effect of antineoplastic and immunosuppressive drugs, initial encounter: Secondary | ICD-10-CM | POA: Diagnosis not present

## 2019-10-11 DIAGNOSIS — C8190 Hodgkin lymphoma, unspecified, unspecified site: Secondary | ICD-10-CM | POA: Diagnosis not present

## 2019-10-11 DIAGNOSIS — J984 Other disorders of lung: Secondary | ICD-10-CM | POA: Diagnosis not present

## 2019-10-11 DIAGNOSIS — R69 Illness, unspecified: Secondary | ICD-10-CM | POA: Diagnosis not present

## 2019-10-24 MED FILL — LEVOTHYROXINE SODIUM 75 MCG: 75 | 30 days supply | Qty: 30 | Fill #4

## 2019-11-29 MED FILL — LEVOTHYROXINE 75 MCG TABLET: 75 | 30 days supply | Qty: 30 | Fill #5

## 2020-01-03 MED FILL — LEVOTHYROXINE 75 MCG TABLET: 75 | 30 days supply | Qty: 30 | Fill #6

## 2020-02-08 MED FILL — LEVOTHYROXINE 75 MCG TABLET: 75 | 30 days supply | Qty: 30 | Fill #7

## 2020-03-11 MED FILL — LEVOTHYROXINE 75 MCG TABLET: 75 | 30 days supply | Qty: 30 | Fill #8

## 2020-03-19 DIAGNOSIS — Z6831 Body mass index (BMI) 31.0-31.9, adult: Secondary | ICD-10-CM | POA: Diagnosis not present

## 2020-03-19 DIAGNOSIS — Z01419 Encounter for gynecological examination (general) (routine) without abnormal findings: Secondary | ICD-10-CM | POA: Diagnosis not present

## 2020-04-11 ENCOUNTER — Other Ambulatory Visit: Payer: Self-pay | Admitting: Family Medicine

## 2020-04-11 MED FILL — LEVOTHYROXINE 75 MCG TABLET: 75 | 30 days supply | Qty: 30 | Fill #0

## 2020-05-07 DIAGNOSIS — Z1231 Encounter for screening mammogram for malignant neoplasm of breast: Secondary | ICD-10-CM | POA: Diagnosis not present

## 2020-05-14 MED FILL — LEVOTHYROXINE 75 MCG TABLET: 75 | 30 days supply | Qty: 30 | Fill #1

## 2020-06-17 MED FILL — LEVOTHYROXINE 75 MCG TABLET: 75 | 30 days supply | Qty: 30 | Fill #2

## 2020-06-24 ENCOUNTER — Encounter: Payer: Self-pay | Admitting: Family Medicine

## 2020-07-08 ENCOUNTER — Ambulatory Visit: Payer: Self-pay | Attending: Critical Care Medicine

## 2020-07-08 DIAGNOSIS — Z20822 Contact with and (suspected) exposure to covid-19: Secondary | ICD-10-CM

## 2020-07-09 LAB — NOVEL CORONAVIRUS, NAA: SARS-CoV-2, NAA: NOT DETECTED

## 2020-07-09 LAB — SARS-COV-2, NAA 2 DAY TAT

## 2020-07-21 ENCOUNTER — Other Ambulatory Visit (HOSPITAL_COMMUNITY): Payer: Self-pay

## 2020-07-21 MED FILL — Levothyroxine Sodium Tab 75 MCG: ORAL | 30 days supply | Qty: 30 | Fill #0 | Status: CN

## 2020-07-22 ENCOUNTER — Other Ambulatory Visit (HOSPITAL_COMMUNITY): Payer: Self-pay

## 2020-07-22 MED FILL — Levothyroxine Sodium Tab 75 MCG: ORAL | 30 days supply | Qty: 30 | Fill #0 | Status: AC

## 2020-07-23 ENCOUNTER — Other Ambulatory Visit (HOSPITAL_COMMUNITY): Payer: Self-pay

## 2020-08-25 ENCOUNTER — Other Ambulatory Visit (HOSPITAL_COMMUNITY): Payer: Self-pay

## 2020-08-25 ENCOUNTER — Other Ambulatory Visit: Payer: Self-pay | Admitting: Family Medicine

## 2020-08-28 ENCOUNTER — Other Ambulatory Visit: Payer: Self-pay

## 2020-08-28 ENCOUNTER — Other Ambulatory Visit (HOSPITAL_COMMUNITY): Payer: Self-pay

## 2020-08-28 ENCOUNTER — Other Ambulatory Visit: Payer: Self-pay | Admitting: Family Medicine

## 2020-08-28 MED ORDER — LEVOTHYROXINE SODIUM 75 MCG PO TABS
75.0000 ug | ORAL_TABLET | Freq: Every day | ORAL | 0 refills | Status: DC
  Filled 2020-08-28: qty 30, 30d supply, fill #0

## 2020-09-29 ENCOUNTER — Other Ambulatory Visit: Payer: Self-pay | Admitting: Family Medicine

## 2020-09-29 ENCOUNTER — Other Ambulatory Visit (HOSPITAL_COMMUNITY): Payer: Self-pay

## 2020-09-29 MED ORDER — LEVOTHYROXINE SODIUM 75 MCG PO TABS
75.0000 ug | ORAL_TABLET | Freq: Every day | ORAL | 0 refills | Status: DC
  Filled 2020-09-29: qty 30, 30d supply, fill #0

## 2020-10-06 ENCOUNTER — Emergency Department (HOSPITAL_BASED_OUTPATIENT_CLINIC_OR_DEPARTMENT_OTHER): Payer: Managed Care, Other (non HMO)

## 2020-10-06 ENCOUNTER — Emergency Department (HOSPITAL_BASED_OUTPATIENT_CLINIC_OR_DEPARTMENT_OTHER)
Admission: EM | Admit: 2020-10-06 | Discharge: 2020-10-06 | Disposition: A | Discharge status: Home / Self Care | Payer: Managed Care, Other (non HMO) | Attending: Emergency Medicine | Admitting: Emergency Medicine

## 2020-10-06 ENCOUNTER — Other Ambulatory Visit: Payer: Self-pay

## 2020-10-06 ENCOUNTER — Encounter (HOSPITAL_BASED_OUTPATIENT_CLINIC_OR_DEPARTMENT_OTHER): Payer: Self-pay | Admitting: *Deleted

## 2020-10-06 DIAGNOSIS — W01198A Fall on same level from slipping, tripping and stumbling with subsequent striking against other object, initial encounter: Secondary | ICD-10-CM | POA: Insufficient documentation

## 2020-10-06 DIAGNOSIS — S060X0A Concussion without loss of consciousness, initial encounter: Secondary | ICD-10-CM

## 2020-10-06 DIAGNOSIS — Z79899 Other long term (current) drug therapy: Secondary | ICD-10-CM | POA: Insufficient documentation

## 2020-10-06 DIAGNOSIS — E039 Hypothyroidism, unspecified: Secondary | ICD-10-CM | POA: Diagnosis not present

## 2020-10-06 NOTE — ED Triage Notes (Addendum)
Pt slipped and fell this morning, no LOC. Pt feels a little nauseated with movement, PEARRL, Gait steady. Knot on posterior head.

## 2020-10-06 NOTE — Discharge Instructions (Addendum)
If you continue to have sx, you may need to f/u with the concussion clinic at [place] [phone].

## 2020-10-06 NOTE — ED Provider Notes (Signed)
[facility] Provider Note   CSN: [id number] Arrival date & time: 10/06/20  5621     History Chief Complaint  Patient presents with   Fall   Head Injury    Brenda Frost is a 55 y.o. female.  Pt presents to the ED today with a fall and head injury.  Pt slipped on dog urine this am and hit her head hard against the floor.  She denies loc, but has been feeling nauseous with any movement.  She denies any other pain or injury.        Past Medical History:  Diagnosis Date   Hyperlipidemia    Hypothyroidism     Patient Active Problem List   Diagnosis Date Noted   Idiopathic acute pancreatitis without infection or necrosis 12/30/2018   Dilated bile duct 12/30/2018   Abnormal liver ultrasound 12/30/2018   Pain of upper abdomen 12/30/2018   Abnormal cervical Papanicolaou smear 08/02/2018   Insulin resistance 09/28/2017   Vitamin D deficiency 09/28/2017   Hormone replacement therapy (HRT) 09/28/2017   Nonspecific (abnormal) findings on radiological and other examination of biliary tract 08/09/2013   Hyperlipidemia 07/27/2010    Past Surgical History:  Procedure Laterality Date   CESAREAN SECTION     ORIF ELBOW FRACTURE Right 02/15/2016   Procedure: OPEN REDUCTION INTERNAL FIXATION (ORIF) ELBOW/OLECRANON FRACTURE;  Surgeon: Iran Planas, MD;  Location: [facility];  Service: Orthopedics;  Laterality: Right;     OB History     Gravida  2   Para      Term      Preterm      AB      Living  2      SAB      IAB      Ectopic      Multiple      Live Births              Family History  Problem Relation Age of Onset   Diabetes Mother    Hyperlipidemia Mother    Hypertension Mother    Kidney disease Mother    Heart disease Maternal Grandmother    CAD Neg Hx    Breast cancer Neg Hx    Colon cancer Neg Hx    Esophageal cancer Neg Hx    Inflammatory bowel disease Neg Hx    Liver disease Neg Hx    Pancreatic cancer Neg Hx     Rectal cancer Neg Hx    Stomach cancer Neg Hx     Social History   Tobacco Use   Smoking status: Never   Smokeless tobacco: Never  Vaping Use   Vaping Use: Never used  Substance Use Topics   Alcohol use: Not Currently    Alcohol/week: 1.0 standard drink    Types: 1 Cans of beer per week   Drug use: No    Home Medications Prior to Admission medications   Medication Sig Start Date End Date Taking? Authorizing Provider  levothyroxine (SYNTHROID) 75 MCG tablet Take 1 tablet (75 mcg total) by mouth daily. 09/29/20  Yes Allwardt, Alyssa M, PA-C  rosuvastatin (CRESTOR) 20 MG tablet Take 1 tablet (20 mg total) by mouth daily. 05/18/19   Hilty, Nadean Corwin, MD    Allergies    Patient has no known allergies.  Review of Systems   Review of Systems  Gastrointestinal:  Positive for nausea.  Neurological:  Positive for headaches.  All other systems reviewed and are negative.  Physical Exam Updated Vital Signs BP 139/79 (BP Location: Right Arm)   Pulse 71   Temp 97.9 F (36.6 C) (Oral)   Resp 16   Ht 5\' 5"  (1.651 m)   Wt 87.1 kg   LMP 04/25/2016   SpO2 100%   BMI 31.95 kg/m   Physical Exam Vitals and nursing note reviewed.  Constitutional:      Appearance: Normal appearance.  HENT:     Head: Normocephalic.      Comments: Hematoma posterior scalp    Right Ear: External ear normal.     Left Ear: External ear normal.     Nose: Nose normal.     Mouth/Throat:     Mouth: Mucous membranes are moist.     Pharynx: Oropharynx is clear.  Eyes:     Extraocular Movements: Extraocular movements intact.     Conjunctiva/sclera: Conjunctivae normal.     Pupils: Pupils are equal, round, and reactive to light.  Cardiovascular:     Rate and Rhythm: Normal rate and regular rhythm.     Pulses: Normal pulses.     Heart sounds: Normal heart sounds.  Pulmonary:     Effort: Pulmonary effort is normal.     Breath sounds: Normal breath sounds.  Abdominal:     General: Abdomen is flat.  Bowel sounds are normal.     Palpations: Abdomen is soft.  Musculoskeletal:        General: Normal range of motion.     Cervical back: Normal range of motion and neck supple.  Skin:    General: Skin is warm.     Capillary Refill: Capillary refill takes less than 2 seconds.  Neurological:     General: No focal deficit present.     Mental Status: She is alert and oriented to person, place, and time.  Psychiatric:        Mood and Affect: Mood normal.        Behavior: Behavior normal.    ED Results / Procedures / Treatments   Labs (all labs ordered are listed, but only abnormal results are displayed) Labs Reviewed - No data to display  EKG None  Radiology CT Head Wo Contrast  Result Date: 10/06/2020 CLINICAL DATA:  55 year old female status post fall this morning. Nausea. EXAM: CT HEAD WITHOUT CONTRAST TECHNIQUE: Contiguous axial images were obtained from the base of the skull through the vertex without intravenous contrast. COMPARISON:  Head CT 08/11/2015. FINDINGS: Brain: No midline shift, ventriculomegaly, mass effect, evidence of mass lesion, intracranial hemorrhage or evidence of cortically based acute infarction. Gray-white matter differentiation is within normal limits throughout the brain. Mild vascular calcifications at the basal ganglia are chronic. Vascular: Mild Calcified atherosclerosis at the skull base. No suspicious intracranial vascular hyperdensity. Skull: Stable and intact. Sinuses/Orbits: Visualized paranasal sinuses and mastoids are clear. Other: No orbit or scalp soft tissue injury identified. IMPRESSION: No acute traumatic injury identified. Stable and normal noncontrast CT appearance of the brain. Electronically Signed   By: Genevie Ann M.D.   On: 10/06/2020 09:37    Procedures Procedures   Medications Ordered in ED Medications - No data to display  ED Course  I have reviewed the triage vital signs and the nursing notes.  Pertinent labs & imaging results that  were available during my care of the patient were reviewed by me and considered in my medical decision making (see chart for details).    MDM Rules/Calculators/A&P  Pt has not wanted any meds for sx.  She is stable for d/c.  She is given the number for the concussion clinic if sx continue.  Return if worse.  F/u with pcp. Final Clinical Impression(s) / ED Diagnoses Final diagnoses:  Concussion without loss of consciousness, initial encounter    Rx / DC Orders ED Discharge Orders     None        Isla Pence, MD 10/06/20 0945

## 2020-10-06 NOTE — ED Notes (Signed)
Patient verbalizes understanding of discharge instructions. Opportunity for questioning and answers were provided. Patient discharged from ED.  °

## 2020-10-07 ENCOUNTER — Encounter: Payer: Managed Care, Other (non HMO) | Admitting: Physician Assistant

## 2020-10-16 ENCOUNTER — Other Ambulatory Visit: Payer: Self-pay

## 2020-10-16 ENCOUNTER — Other Ambulatory Visit (HOSPITAL_COMMUNITY): Payer: Self-pay

## 2020-10-16 ENCOUNTER — Ambulatory Visit (INDEPENDENT_AMBULATORY_CARE_PROVIDER_SITE_OTHER): Payer: Managed Care, Other (non HMO) | Admitting: Physician Assistant

## 2020-10-16 VITALS — BP 103/68 | HR 81 | Temp 97.4°F | Ht 65.0 in | Wt 191.2 lb

## 2020-10-16 DIAGNOSIS — Z0000 Encounter for general adult medical examination without abnormal findings: Secondary | ICD-10-CM

## 2020-10-16 DIAGNOSIS — R8290 Unspecified abnormal findings in urine: Secondary | ICD-10-CM

## 2020-10-16 DIAGNOSIS — E038 Other specified hypothyroidism: Secondary | ICD-10-CM | POA: Diagnosis not present

## 2020-10-16 DIAGNOSIS — Z1211 Encounter for screening for malignant neoplasm of colon: Secondary | ICD-10-CM

## 2020-10-16 DIAGNOSIS — E782 Mixed hyperlipidemia: Secondary | ICD-10-CM

## 2020-10-16 DIAGNOSIS — D485 Neoplasm of uncertain behavior of skin: Secondary | ICD-10-CM

## 2020-10-16 DIAGNOSIS — R7303 Prediabetes: Secondary | ICD-10-CM

## 2020-10-16 LAB — POCT URINALYSIS DIPSTICK
Bilirubin, UA: NEGATIVE
Blood, UA: NEGATIVE
Glucose, UA: NEGATIVE
Ketones, UA: NEGATIVE
Leukocytes, UA: NEGATIVE
Nitrite, UA: NEGATIVE
Protein, UA: NEGATIVE
Spec Grav, UA: <=1.005 — AB (ref 1.010–1.025)
Urobilinogen, UA: 0.2 E.U./dL
pH, UA: 6 (ref 5.0–8.0)

## 2020-10-16 MED ORDER — LEVOTHYROXINE SODIUM 75 MCG PO TABS
75.0000 ug | ORAL_TABLET | Freq: Every day | ORAL | 3 refills | Status: DC
  Filled 2020-10-16 – 2020-11-12 (×4): qty 90, 90d supply, fill #0
  Filled 2021-02-18: qty 90, 90d supply, fill #1
  Filled 2021-05-29: qty 90, 90d supply, fill #2
  Filled 2021-09-28: qty 90, 90d supply, fill #3

## 2020-10-16 NOTE — Patient Instructions (Addendum)
Good to meet you today! Order placed for Cologuard.  Keep up good work on healthy lifestyle. Call if any concerns.  Schedule for fasting labs, mole removal, and next year's physical.  Urinalysis today.

## 2020-10-16 NOTE — Progress Notes (Signed)
Established Patient Office Visit  Subjective:  Patient ID: Brenda Frost, female    DOB: [birth year]  Age: 55 y.o. MRN: [id number]  CC:  Chief Complaint  Patient presents with   Annual Exam   Back Pain    Lower back    HPI Brenda Frost presents for annual CPE.  Acute concerns: Wants TSH checked. Urine has had an ammonia odor but no symptoms. Also wants moles checked.   Health maintenance: Lifestyle/ exercise: Works as a Marine scientist, stays busy Nutrition: Well balanced Mental health: Doing well  Immunizations: UTD Colonoscopy: Cologuard this year Pap: UTD Mammogram: UTD   Past Medical History:  Diagnosis Date   Hyperlipidemia    Hypothyroidism     Past Surgical History:  Procedure Laterality Date   CESAREAN SECTION     ORIF ELBOW FRACTURE Right 02/15/2016   Procedure: OPEN REDUCTION INTERNAL FIXATION (ORIF) ELBOW/OLECRANON FRACTURE;  Surgeon: Iran Planas, MD;  Location: [facility];  Service: Orthopedics;  Laterality: Right;    Family History  Problem Relation Age of Onset   Diabetes Mother    Hyperlipidemia Mother    Hypertension Mother    Kidney disease Mother    Heart disease Maternal Grandmother    CAD Neg Hx    Breast cancer Neg Hx    Colon cancer Neg Hx    Esophageal cancer Neg Hx    Inflammatory bowel disease Neg Hx    Liver disease Neg Hx    Pancreatic cancer Neg Hx    Rectal cancer Neg Hx    Stomach cancer Neg Hx     Social History   Socioeconomic History   Marital status: Married    Spouse name: Not on file   Number of children: 2   Years of education: Not on file   Highest education level: Not on file  Occupational History   Occupation: Chartered loss adjuster    Employer: [employer]  Tobacco Use   Smoking status: Never   Smokeless tobacco: Never  Vaping Use   Vaping Use: Never used  Substance and Sexual Activity   Alcohol use: Not Currently    Alcohol/week: 1.0 standard drink    Types: 1 Cans of beer per week   Drug use: No    Sexual activity: Not on file  Other Topics Concern   Not on file  Social History Narrative   Not on file   Social Determinants of Health   Financial Resource Strain: Not on file  Food Insecurity: Not on file  Transportation Needs: Not on file  Physical Activity: Not on file  Stress: Not on file  Social Connections: Not on file  Intimate Partner Violence: Not on file    Outpatient Medications Prior to Visit  Medication Sig Dispense Refill   levothyroxine (SYNTHROID) 75 MCG tablet Take 1 tablet (75 mcg total) by mouth daily. 30 tablet 0   rosuvastatin (CRESTOR) 20 MG tablet Take 1 tablet (20 mg total) by mouth daily. 90 tablet 1   No facility-administered medications prior to visit.    No Known Allergies  ROS Review of Systems    Objective:    Physical Exam Vitals and nursing note reviewed.  Constitutional:      General: She is not in acute distress.    Appearance: Normal appearance. She is normal weight.  HENT:     Head: Normocephalic.     Right Ear: External ear normal.     Left Ear: External ear normal.  Nose: Nose normal.     Mouth/Throat:     Mouth: Mucous membranes are moist.  Eyes:     Extraocular Movements: Extraocular movements intact.     Conjunctiva/sclera: Conjunctivae normal.     Pupils: Pupils are equal, round, and reactive to light.  Cardiovascular:     Rate and Rhythm: Normal rate and regular rhythm.     Pulses: Normal pulses.     Heart sounds: No murmur heard. Pulmonary:     Effort: Pulmonary effort is normal.     Breath sounds: Normal breath sounds.  Abdominal:     General: Abdomen is flat. Bowel sounds are normal.     Palpations: Abdomen is soft.     Tenderness: There is no abdominal tenderness.  Musculoskeletal:        General: Normal range of motion.     Cervical back: Normal range of motion.  Skin:    General: Skin is warm.     Comments: Raised small brown nevus left side of back. Right lateral back there is a flat hazy darker  pigmented lesion with irregular borders.   Neurological:     General: No focal deficit present.     Mental Status: She is alert and oriented to person, place, and time.     Gait: Gait normal.  Psychiatric:        Mood and Affect: Mood normal.        Behavior: Behavior normal.    BP 103/68   Pulse 81   Temp (!) 97.4 F (36.3 C)   Ht '5\' 5"'$  (1.651 m)   Wt 191 lb 4 oz (86.8 kg)   LMP 04/25/2016   SpO2 100%   BMI 31.83 kg/m  Wt Readings from Last 3 Encounters:  10/16/20 191 lb 4 oz (86.8 kg)  10/06/20 192 lb (87.1 kg)  07/06/19 189 lb 12.8 oz (86.1 kg)     Health Maintenance Due  Topic Date Due   Hepatitis C Screening  Never done   PAP SMEAR-Modifier  Never done   COLONOSCOPY (Pts 45-77yr Insurance coverage will need to be confirmed)  Never done   Zoster Vaccines- Shingrix (1 of 2) Never done   COLON CANCER SCREENING ANNUAL FOBT  10/06/2018   MAMMOGRAM  07/14/2019    There are no preventive care reminders to display for this patient.  Lab Results  Component Value Date   TSH 0.77 03/21/2019   Lab Results  Component Value Date   WBC 5.8 03/21/2019   HGB 14.0 03/21/2019   HCT 42.8 03/21/2019   MCV 89.2 03/21/2019   PLT 192.0 03/21/2019   Lab Results  Component Value Date   NA 141 03/21/2019   K 4.4 03/21/2019   CO2 28 03/21/2019   GLUCOSE 107 (H) 03/21/2019   BUN 19 03/21/2019   CREATININE 0.77 03/21/2019   BILITOT 0.7 03/21/2019   ALKPHOS 118 (H) 03/21/2019   AST 14 03/21/2019   ALT 11 03/21/2019   PROT 6.8 03/21/2019   ALBUMIN 4.0 03/21/2019   CALCIUM 9.9 03/21/2019   GFR 78.55 03/21/2019   Lab Results  Component Value Date   CHOL 307 (H) 12/20/2018   Lab Results  Component Value Date   HDL 90.60 12/20/2018   Lab Results  Component Value Date   LDLCALC 203 (H) 12/20/2018   Lab Results  Component Value Date   TRIG 67.0 12/20/2018   Lab Results  Component Value Date   CHOLHDL 3 12/20/2018   Lab Results  Component Value Date   HGBA1C  6.2 12/20/2018      Assessment & Plan:   Problem List Items Addressed This Visit   None Visit Diagnoses     Encounter for annual physical exam    -  Primary       No orders of the defined types were placed in this encounter.   Follow-up: No follow-ups on file.  1. Encounter for annual physical exam 2. Screening for colon cancer 3. Other specified hypothyroidism 4. Mixed hyperlipidemia 5. Prediabetes 6. Abnormal urine odor Age-appropriate screening and counseling performed today. Will check labs and call with results (she is not fasting today and will reschedule labs). Preventive measures discussed and printed in AVS for patient. Cologuard ordered.    7. Neoplasm of uncertain behavior of skin She will schedule for removal and punch biopsy in our office this year.     Khyra Viscuso M Nevin Grizzle, PA-C

## 2020-10-17 ENCOUNTER — Other Ambulatory Visit (INDEPENDENT_AMBULATORY_CARE_PROVIDER_SITE_OTHER): Payer: Managed Care, Other (non HMO)

## 2020-10-17 DIAGNOSIS — E038 Other specified hypothyroidism: Secondary | ICD-10-CM

## 2020-10-17 DIAGNOSIS — E782 Mixed hyperlipidemia: Secondary | ICD-10-CM | POA: Diagnosis not present

## 2020-10-17 DIAGNOSIS — Z0000 Encounter for general adult medical examination without abnormal findings: Secondary | ICD-10-CM | POA: Diagnosis not present

## 2020-10-17 DIAGNOSIS — R7303 Prediabetes: Secondary | ICD-10-CM | POA: Diagnosis not present

## 2020-10-17 LAB — LIPID PANEL
Cholesterol: 281 mg/dL — ABNORMAL HIGH (ref 0–200)
HDL: 72.3 mg/dL (ref >39.00)
LDL Cholesterol: 196 mg/dL — ABNORMAL HIGH (ref 0–99)
NonHDL: 208.84
Total CHOL/HDL Ratio: 4
Triglycerides: 65 mg/dL (ref 0.0–149.0)
VLDL: 13 mg/dL (ref 0.0–40.0)

## 2020-10-17 LAB — HEMOGLOBIN A1C: Hgb A1c MFr Bld: 6 % (ref 4.6–6.5)

## 2020-10-17 LAB — COMPREHENSIVE METABOLIC PANEL
ALT: 12 U/L (ref 0–35)
AST: 16 U/L (ref 0–37)
Albumin: 4 g/dL (ref 3.5–5.2)
Alkaline Phosphatase: 96 U/L (ref 39–117)
BUN: 19 mg/dL (ref 6–23)
CO2: 30 mEq/L (ref 19–32)
Calcium: 9.9 mg/dL (ref 8.4–10.5)
Chloride: 104 mEq/L (ref 96–112)
Creatinine, Ser: 0.93 mg/dL (ref 0.40–1.20)
GFR: 69.87 mL/min (ref >60.00)
Glucose, Bld: 113 mg/dL — ABNORMAL HIGH (ref 70–99)
Potassium: 4.1 mEq/L (ref 3.5–5.1)
Sodium: 141 mEq/L (ref 135–145)
Total Bilirubin: 0.8 mg/dL (ref 0.2–1.2)
Total Protein: 7.2 g/dL (ref 6.0–8.3)

## 2020-10-17 LAB — CBC WITH DIFFERENTIAL/PLATELET
Basophils Absolute: 0 10*3/uL (ref 0.0–0.1)
Basophils Relative: 0.6 % (ref 0.0–3.0)
Eosinophils Absolute: 0.1 10*3/uL (ref 0.0–0.7)
Eosinophils Relative: 1.5 % (ref 0.0–5.0)
HCT: 42.7 % (ref 36.0–46.0)
Hemoglobin: 14.1 g/dL (ref 12.0–15.0)
Lymphocytes Relative: 30.5 % (ref 12.0–46.0)
Lymphs Abs: 1.6 10*3/uL (ref 0.7–4.0)
MCHC: 32.9 g/dL (ref 30.0–36.0)
MCV: 88.7 fl (ref 78.0–100.0)
Monocytes Absolute: 0.4 10*3/uL (ref 0.1–1.0)
Monocytes Relative: 7.5 % (ref 3.0–12.0)
Neutro Abs: 3.1 10*3/uL (ref 1.4–7.7)
Neutrophils Relative %: 59.9 % (ref 43.0–77.0)
Platelets: 184 10*3/uL (ref 150.0–400.0)
RBC: 4.82 Mil/uL (ref 3.87–5.11)
RDW: 13.6 % (ref 11.5–15.5)
WBC: 5.2 10*3/uL (ref 4.0–10.5)

## 2020-10-17 LAB — TSH: TSH: 3.67 u[IU]/mL (ref 0.35–5.50)

## 2020-10-28 LAB — COLOGUARD: Cologuard: NEGATIVE

## 2020-10-29 ENCOUNTER — Other Ambulatory Visit (HOSPITAL_COMMUNITY): Payer: Self-pay

## 2020-10-29 ENCOUNTER — Other Ambulatory Visit: Payer: Self-pay

## 2020-10-29 MED ORDER — PANTOPRAZOLE SODIUM 20 MG PO TBEC
20.0000 mg | DELAYED_RELEASE_TABLET | Freq: Every day | ORAL | 1 refills | Status: DC
  Filled 2020-10-29 – 2020-11-12 (×2): qty 90, 90d supply, fill #0

## 2020-11-04 ENCOUNTER — Other Ambulatory Visit (HOSPITAL_COMMUNITY): Payer: Self-pay

## 2020-11-12 ENCOUNTER — Other Ambulatory Visit (HOSPITAL_COMMUNITY): Payer: Self-pay

## 2021-01-08 ENCOUNTER — Ambulatory Visit: Payer: Managed Care, Other (non HMO) | Admitting: Physician Assistant

## 2021-01-09 ENCOUNTER — Ambulatory Visit: Payer: Managed Care, Other (non HMO) | Admitting: Physician Assistant

## 2021-01-12 ENCOUNTER — Telehealth (INDEPENDENT_AMBULATORY_CARE_PROVIDER_SITE_OTHER): Payer: Managed Care, Other (non HMO) | Admitting: Physician Assistant

## 2021-01-12 ENCOUNTER — Encounter: Payer: Self-pay | Admitting: Physician Assistant

## 2021-01-12 ENCOUNTER — Other Ambulatory Visit (HOSPITAL_COMMUNITY): Payer: Self-pay

## 2021-01-12 VITALS — Temp 97.1°F

## 2021-01-12 DIAGNOSIS — J0100 Acute maxillary sinusitis, unspecified: Secondary | ICD-10-CM

## 2021-01-12 MED ORDER — PREDNISONE 20 MG PO TABS
20.0000 mg | ORAL_TABLET | Freq: Two times a day (BID) | ORAL | 0 refills | Status: AC
  Filled 2021-01-12: qty 8, 4d supply, fill #0

## 2021-01-12 MED ORDER — AMOXICILLIN-POT CLAVULANATE 875-125 MG PO TABS
1.0000 | ORAL_TABLET | Freq: Two times a day (BID) | ORAL | 0 refills | Status: DC
  Filled 2021-01-12: qty 20, 10d supply, fill #0

## 2021-01-12 NOTE — Progress Notes (Signed)
Virtual Visit via Video Note  I connected with  Brenda Frost  on 01/12/21 at  9:30 AM EDT by a video enabled telemedicine application and verified that I am speaking with the correct person using two identifiers.  Location: Patient: home Provider: Therapist, music at [place] present: Patient and myself   I discussed the limitations of evaluation and management by telemedicine and the availability of in person appointments. The patient expressed understanding and agreed to proceed.   History of Present Illness: Chief complaint: Facial / sinus pain and pressure Symptom onset: 01/07/21 Pertinent positives: Teeth and sinus pain; nasal congestion  Pertinent negatives: Fever, chills, SOB, chest pain, N/V/D Treatments tried: Decongestant, nasal spray, Dayquil Vaccine status: 2 COVID-19 shots; due for flu shot Sick exposure: Husband recently tested positive for COVID-19. Pt took three negative at home tests so far.    Observations/Objective:   Gen: Awake, alert, no acute distress, congested Resp: Breathing is even and non-labored Psych: calm/pleasant demeanor Neuro: Alert and Oriented x 3, + facial symmetry, speech is clear.   Assessment and Plan:  1. Acute maxillary sinusitis, recurrence not specified Persistent symptoms despite conservative efforts at home. Will Rx Augmentin at this time, take with food. Cautioned on antibiotic use and possible side effects. Prednisone 20 mg bid po x 4 days for added relief. Advised nasal saline, humidifier, and pushing fluids. Call if worse or no improvement.    Follow Up Instructions:    I discussed the assessment and treatment plan with the patient. The patient was provided an opportunity to ask questions and all were answered. The patient agreed with the plan and demonstrated an understanding of the instructions.   The patient was advised to call back or seek an in-person evaluation if the symptoms worsen or if the  condition fails to improve as anticipated.  Brenda Frost M Brenda Vallely, PA-C

## 2021-01-19 ENCOUNTER — Ambulatory Visit (INDEPENDENT_AMBULATORY_CARE_PROVIDER_SITE_OTHER): Payer: Managed Care, Other (non HMO) | Admitting: Physician Assistant

## 2021-01-19 ENCOUNTER — Other Ambulatory Visit: Payer: Self-pay

## 2021-01-19 VITALS — BP 117/74 | HR 74 | Ht 65.0 in | Wt 186.2 lb

## 2021-01-19 DIAGNOSIS — Z23 Encounter for immunization: Secondary | ICD-10-CM | POA: Diagnosis not present

## 2021-01-19 DIAGNOSIS — U071 COVID-19: Secondary | ICD-10-CM

## 2021-01-19 DIAGNOSIS — J0100 Acute maxillary sinusitis, unspecified: Secondary | ICD-10-CM | POA: Diagnosis not present

## 2021-01-19 NOTE — Progress Notes (Signed)
Subjective:    Patient ID: Brenda Frost, female    DOB: 1965/12/16, 55 y.o.   MRN: [id number]  Chief Complaint  Patient presents with   Follow-up    HPI Patient is in today for follow up from video visit 01/12/21. She did end up taking a PCR COVID test on 01/15/21 and results came back positive on 01/18/21. Her symptoms started on 01/07/21.  Feels fatigued, brain fog,and has a lot of sinus pressure still. Mucinex and Sinus Tylenol OTC helping minimally. Still is taking Augmentin po. Could not tolerate the prednisone. She works for [employer] and says she needs flu vaccine today.   Denies any fever, chills, chest pain, N/V/D.   Past Medical History:  Diagnosis Date   Hyperlipidemia    Hypothyroidism     Past Surgical History:  Procedure Laterality Date   CESAREAN SECTION     ORIF ELBOW FRACTURE Right 02/15/2016   Procedure: OPEN REDUCTION INTERNAL FIXATION (ORIF) ELBOW/OLECRANON FRACTURE;  Surgeon: Iran Planas, MD;  Location: [facility];  Service: Orthopedics;  Laterality: Right;    Family History  Problem Relation Age of Onset   Diabetes Mother    Hyperlipidemia Mother    Hypertension Mother    Kidney disease Mother    Heart disease Maternal Grandmother    CAD Neg Hx    Breast cancer Neg Hx    Colon cancer Neg Hx    Esophageal cancer Neg Hx    Inflammatory bowel disease Neg Hx    Liver disease Neg Hx    Pancreatic cancer Neg Hx    Rectal cancer Neg Hx    Stomach cancer Neg Hx     Social History   Tobacco Use   Smoking status: Never   Smokeless tobacco: Never  Vaping Use   Vaping Use: Never used  Substance Use Topics   Alcohol use: Not Currently    Alcohol/week: 1.0 standard drink    Types: 1 Cans of beer per week   Drug use: No     No Known Allergies  Review of Systems REFER TO HPI FOR PERTINENT POSITIVES AND NEGATIVES      Objective:     BP 117/74   Pulse 74   Ht 5\' 5"  (1.651 m)   Wt 186 lb 3.2 oz (84.5 kg)   LMP 04/25/2016   SpO2 100%    BMI 30.99 kg/m   Wt Readings from Last 3 Encounters:  01/19/21 186 lb 3.2 oz (84.5 kg)  10/16/20 191 lb 4 oz (86.8 kg)  10/06/20 192 lb (87.1 kg)    BP Readings from Last 3 Encounters:  01/19/21 117/74  10/16/20 103/68  10/06/20 124/79     Physical Exam Vitals and nursing note reviewed.  Constitutional:      Appearance: Normal appearance. She is normal weight. She is not toxic-appearing.  HENT:     Head: Normocephalic and atraumatic.     Right Ear: Tympanic membrane, ear canal and external ear normal.     Left Ear: Tympanic membrane, ear canal and external ear normal.     Nose:     Right Turbinates: Enlarged.     Left Turbinates: Enlarged.     Right Sinus: Maxillary sinus tenderness present.     Left Sinus: Maxillary sinus tenderness present.     Mouth/Throat:     Mouth: Mucous membranes are moist.  Eyes:     Extraocular Movements: Extraocular movements intact.     Conjunctiva/sclera: Conjunctivae normal.  Pupils: Pupils are equal, round, and reactive to light.  Cardiovascular:     Rate and Rhythm: Normal rate and regular rhythm.     Pulses: Normal pulses.     Heart sounds: Normal heart sounds.  Pulmonary:     Effort: Pulmonary effort is normal.     Breath sounds: Normal breath sounds.  Musculoskeletal:        General: Normal range of motion.     Cervical back: Normal range of motion and neck supple.  Skin:    General: Skin is warm and dry.  Neurological:     General: No focal deficit present.     Mental Status: She is alert and oriented to person, place, and time.  Psychiatric:        Mood and Affect: Mood normal.        Behavior: Behavior normal.        Thought Content: Thought content normal.        Judgment: Judgment normal.       Assessment & Plan:   Problem List Items Addressed This Visit   None Visit Diagnoses     COVID-19    -  Primary   Acute maxillary sinusitis, recurrence not specified           1. COVID-19 2. Acute maxillary  sinusitis, recurrence not specified -Outside of quarantine window -Still having sinus pressure, fatigue -Will plan to stay out of work until 01/22/21 -Rest, fluids, vitamins, nasal saline, humidifier -Finish Augmentin course -Flu vaccine in office today -She will recheck prn   This note was prepared with assistance of Dragon voice recognition software. Occasional wrong-word or sound-a-like substitutions may have occurred due to the inherent limitations of voice recognition software.  Time Spent: 28 minutes of total time was spent on the date of the encounter performing the following actions: chart review prior to seeing the patient, obtaining history, performing a medically necessary exam, counseling on the treatment plan, placing orders, and documenting in our EHR.    Adeana Grilliot M Aya Geisel, PA-C

## 2021-01-19 NOTE — Patient Instructions (Signed)
Plan to return to work on Thursday. REST, push fluids, nasal saline, humidifier at home. Finish the Augmentin.  Flu vaccine in office today.  Recheck prn.

## 2021-01-28 ENCOUNTER — Ambulatory Visit: Payer: Self-pay

## 2021-01-28 ENCOUNTER — Other Ambulatory Visit: Payer: Self-pay | Admitting: Occupational Medicine

## 2021-01-28 ENCOUNTER — Other Ambulatory Visit: Payer: Self-pay

## 2021-01-28 DIAGNOSIS — S20212A Contusion of left front wall of thorax, initial encounter: Secondary | ICD-10-CM

## 2021-02-18 ENCOUNTER — Other Ambulatory Visit (HOSPITAL_COMMUNITY): Payer: Self-pay

## 2021-03-02 ENCOUNTER — Other Ambulatory Visit (HOSPITAL_COMMUNITY): Payer: Self-pay

## 2021-03-02 ENCOUNTER — Encounter: Payer: Self-pay | Admitting: Physician Assistant

## 2021-03-02 ENCOUNTER — Ambulatory Visit (INDEPENDENT_AMBULATORY_CARE_PROVIDER_SITE_OTHER): Payer: Managed Care, Other (non HMO) | Admitting: Physician Assistant

## 2021-03-02 ENCOUNTER — Other Ambulatory Visit: Payer: Self-pay

## 2021-03-02 VITALS — BP 119/81 | HR 81 | Temp 97.4°F | Ht 65.0 in | Wt 188.5 lb

## 2021-03-02 DIAGNOSIS — N950 Postmenopausal bleeding: Secondary | ICD-10-CM

## 2021-03-02 DIAGNOSIS — M67921 Unspecified disorder of synovium and tendon, right upper arm: Secondary | ICD-10-CM | POA: Diagnosis not present

## 2021-03-02 MED ORDER — NAPROXEN 500 MG PO TABS
500.0000 mg | ORAL_TABLET | Freq: Two times a day (BID) | ORAL | 0 refills | Status: DC
  Filled 2021-03-02: qty 20, 10d supply, fill #0

## 2021-03-02 NOTE — Progress Notes (Signed)
Subjective:    Patient ID: Brenda Frost, female    DOB: 07-Sep-1965, 55 y.o.   MRN: [id number]  Chief Complaint  Patient presents with   Shoulder Pain    Right     HPI Patient is in today for right shoulder pain. She states that this pain has gradually started to worsen over the last 24 to 48 hours. Right hand dominant.  She denies any known injury, but states that she has been doing a lot of present wrapping for Christmas.  This morning she was having trouble lifting her arm because of the pain. 3 Ibuprofen and 2 Tylenol has helped since then.  No weakness or numbness.  No slurred speech. No headaches or dizziness.  She also tells me that she has new postmenopausal bleeding since 01/28/21 after slip and fall at work.  Spotting occasionally.  She was seen by a clinic for [employer] and was told that she might of just jarred something from her fall and to give it time.  Also has recent diagnosis of COVID-19 on 01/19/2021.   Past Medical History:  Diagnosis Date   Hyperlipidemia    Hypothyroidism     Past Surgical History:  Procedure Laterality Date   CESAREAN SECTION     ORIF ELBOW FRACTURE Right 02/15/2016   Procedure: OPEN REDUCTION INTERNAL FIXATION (ORIF) ELBOW/OLECRANON FRACTURE;  Surgeon: Iran Planas, MD;  Location: [facility];  Service: Orthopedics;  Laterality: Right;    Family History  Problem Relation Age of Onset   Diabetes Mother    Hyperlipidemia Mother    Hypertension Mother    Kidney disease Mother    Heart disease Maternal Grandmother    CAD Neg Hx    Breast cancer Neg Hx    Colon cancer Neg Hx    Esophageal cancer Neg Hx    Inflammatory bowel disease Neg Hx    Liver disease Neg Hx    Pancreatic cancer Neg Hx    Rectal cancer Neg Hx    Stomach cancer Neg Hx     Social History   Tobacco Use   Smoking status: Never   Smokeless tobacco: Never  Vaping Use   Vaping Use: Never used  Substance Use Topics   Alcohol use: Not Currently     Alcohol/week: 1.0 standard drink    Types: 1 Cans of beer per week   Drug use: No     No Known Allergies  Review of Systems NEGATIVE UNLESS OTHERWISE INDICATED IN HPI      Objective:     BP 119/81   Pulse 81   Temp (!) 97.4 F (36.3 C)   Ht 5\' 5"  (1.651 m)   Wt 188 lb 8 oz (85.5 kg)   LMP 04/25/2016   SpO2 98%   BMI 31.37 kg/m   Wt Readings from Last 3 Encounters:  03/02/21 188 lb 8 oz (85.5 kg)  01/19/21 186 lb 3.2 oz (84.5 kg)  10/16/20 191 lb 4 oz (86.8 kg)    BP Readings from Last 3 Encounters:  03/02/21 119/81  01/19/21 117/74  10/16/20 103/68     Physical Exam Vitals and nursing note reviewed.  Constitutional:      Appearance: Normal appearance. She is normal weight. She is not toxic-appearing.  HENT:     Head: Normocephalic and atraumatic.     Right Ear: External ear normal.     Left Ear: External ear normal.     Nose: Nose normal.  Mouth/Throat:     Mouth: Mucous membranes are moist.  Eyes:     Extraocular Movements: Extraocular movements intact.     Conjunctiva/sclera: Conjunctivae normal.     Pupils: Pupils are equal, round, and reactive to light.  Cardiovascular:     Rate and Rhythm: Normal rate and regular rhythm.     Pulses: Normal pulses.     Heart sounds: Normal heart sounds.  Pulmonary:     Effort: Pulmonary effort is normal.     Breath sounds: Normal breath sounds.  Musculoskeletal:     Right shoulder: Tenderness (proximal bicipital tendon) present. No swelling, bony tenderness or crepitus. Decreased range of motion. Normal strength. Normal pulse.     Cervical back: Normal range of motion and neck supple.  Skin:    General: Skin is warm and dry.  Neurological:     General: No focal deficit present.     Mental Status: She is alert and oriented to person, place, and time.  Psychiatric:        Mood and Affect: Mood normal.        Behavior: Behavior normal.        Thought Content: Thought content normal.        Judgment: Judgment  normal.       Assessment & Plan:   Problem List Items Addressed This Visit   None Visit Diagnoses     Tendinopathy of right biceps tendon    -  Primary   Postmenopausal vaginal bleeding       Relevant Orders   US PELVIC COMPLETE WITH TRANSVAGINAL        Meds ordered this encounter  Medications   naproxen (NAPROSYN) 500 MG tablet    Sig: Take 1 tablet (500 mg total) by mouth 2 (two) times daily with a meal.    Dispense:  20 tablet    Refill:  0   1. Tendinopathy of right biceps tendon -She is point tender over the proximal tendon of the right biceps.  Symptoms seem consistent with tendinopathy due to overuse.  Conservative treatment at this time using naproxen as directed as well as RICE method. -Plan to recheck if symptoms worsen or do not improve in the next few weeks.  She will let me know.  2. Postmenopausal vaginal bleeding -Discussed with patient that yes this may be possible from her recent fall or even COVID-19 infection.  Order for ultrasound of the pelvis was placed and she is going to plan to have this done if her symptoms persist over the next few weeks into the new year.  She is going to let me know how she is doing with this as well.   This note was prepared with assistance of Systems analyst. Occasional wrong-word or sound-a-like substitutions may have occurred due to the inherent limitations of voice recognition software.  Time Spent: 32 minutes of total time was spent on the date of the encounter performing the following actions: chart review prior to seeing the patient, obtaining history, performing a medically necessary exam, counseling on the treatment plan, placing orders, and documenting in our EHR.    Rahel Carlton M Jayleigh Notarianni, PA-C

## 2021-03-02 NOTE — Patient Instructions (Signed)
I think this is biceps tendinitis. Recommend rest, Ice, naproxen as directed.  I have also put order in for pelvic U/S- you may want to schedule this is persistent into January. Let me know.

## 2021-03-11 ENCOUNTER — Ambulatory Visit
Admission: RE | Admit: 2021-03-11 | Discharge: 2021-03-11 | Disposition: A | Discharge status: Home / Self Care | Payer: Managed Care, Other (non HMO) | Source: Ambulatory Visit | Attending: Physician Assistant | Admitting: Physician Assistant

## 2021-03-11 ENCOUNTER — Other Ambulatory Visit: Payer: Self-pay

## 2021-03-11 DIAGNOSIS — N950 Postmenopausal bleeding: Secondary | ICD-10-CM

## 2021-04-24 ENCOUNTER — Other Ambulatory Visit (HOSPITAL_COMMUNITY): Payer: Self-pay

## 2021-04-24 MED ORDER — BACITRACIN-POLYMYXIN B 500-10000 UNIT/GM OP OINT
TOPICAL_OINTMENT | OPHTHALMIC | 1 refills | Status: DC
  Filled 2021-04-24: qty 3.5, 7d supply, fill #0

## 2021-05-11 LAB — RESULTS CONSOLE HPV: CHL HPV: NEGATIVE

## 2021-05-11 LAB — HM PAP SMEAR: HM Pap smear: NEGATIVE

## 2021-05-11 LAB — HM MAMMOGRAPHY

## 2021-05-29 ENCOUNTER — Other Ambulatory Visit (HOSPITAL_COMMUNITY): Payer: Self-pay

## 2021-07-26 ENCOUNTER — Emergency Department (HOSPITAL_BASED_OUTPATIENT_CLINIC_OR_DEPARTMENT_OTHER)
Admission: EM | Admit: 2021-07-26 | Discharge: 2021-07-26 | Disposition: A | Discharge status: Home / Self Care | Payer: BC Managed Care – PPO | Attending: Emergency Medicine | Admitting: Emergency Medicine

## 2021-07-26 ENCOUNTER — Encounter (HOSPITAL_BASED_OUTPATIENT_CLINIC_OR_DEPARTMENT_OTHER): Payer: Self-pay | Admitting: Emergency Medicine

## 2021-07-26 ENCOUNTER — Emergency Department (HOSPITAL_BASED_OUTPATIENT_CLINIC_OR_DEPARTMENT_OTHER): Payer: BC Managed Care – PPO | Admitting: Radiology

## 2021-07-26 ENCOUNTER — Other Ambulatory Visit: Payer: Self-pay

## 2021-07-26 DIAGNOSIS — Z043 Encounter for examination and observation following other accident: Secondary | ICD-10-CM | POA: Diagnosis not present

## 2021-07-26 DIAGNOSIS — W1830XA Fall on same level, unspecified, initial encounter: Secondary | ICD-10-CM | POA: Diagnosis not present

## 2021-07-26 DIAGNOSIS — S52125A Nondisplaced fracture of head of left radius, initial encounter for closed fracture: Secondary | ICD-10-CM | POA: Insufficient documentation

## 2021-07-26 DIAGNOSIS — S59902A Unspecified injury of left elbow, initial encounter: Secondary | ICD-10-CM | POA: Diagnosis not present

## 2021-07-26 DIAGNOSIS — M25522 Pain in left elbow: Secondary | ICD-10-CM | POA: Diagnosis not present

## 2021-07-26 NOTE — ED Provider Notes (Signed)
?[facility] ?Provider Note ? ? ?CSN: [id number] ?Arrival date & time: 07/26/21  5638 ? ?  ? ?History ? ?CC: Fall, elbow injury ? ? ?Brenda Frost is a 56 y.o. female presenting emergency department with a fall yesterday evening, reporting that she landed on her left elbow and has been having pain in her left elbow and inability to fully straighten the left forearm since then.  She is right-handed.  No other injuries reported.  No numbness in her hand.  She is not on blood thinners.  She is here with her husband. ? ?HPI ? ?  ? ?Home Medications ?Prior to Admission medications   ?Medication Sig Start Date End Date Taking? Authorizing Provider  ?bacitracin-polymyxin b (POLYSPORIN) ophthalmic ointment Apply a small amount into left eye 5 times a day 04/24/21     ?levothyroxine (SYNTHROID) 75 MCG tablet Take 1 tablet (75 mcg total) by mouth daily. 10/16/20   Allwardt, Randa Evens, PA-C  ?naproxen (NAPROSYN) 500 MG tablet Take 1 tablet (500 mg total) by mouth 2 (two) times daily with a meal. 03/02/21   Allwardt, Alyssa M, PA-C  ?pantoprazole (PROTONIX) 20 MG tablet Take 1 tablet (20 mg total) by mouth daily. 10/29/20   Allwardt, Randa Evens, PA-C  ?   ? ?Allergies    ?Patient has no known allergies.   ? ?Review of Systems   ?Review of Systems ? ?Physical Exam ?Updated Vital Signs ?BP 124/83 (BP Location: Right Arm)   Pulse 63   Temp (!) 97.5 ?F (36.4 ?C) (Oral)   Resp 18   Ht '5\' 5"'$  (1.651 m)   Wt 84.4 kg   LMP 04/25/2016   SpO2 98%   BMI 30.95 kg/m?  ?Physical Exam ?Constitutional:   ?   General: She is not in acute distress. ?HENT:  ?   Head: Normocephalic and atraumatic.  ?Eyes:  ?   Conjunctiva/sclera: Conjunctivae normal.  ?   Pupils: Pupils are equal, round, and reactive to light.  ?Cardiovascular:  ?   Rate and Rhythm: Normal rate and regular rhythm.  ?   Pulses: Normal pulses.  ?Pulmonary:  ?   Effort: Pulmonary effort is normal. No respiratory distress.  ?Musculoskeletal:  ?   Comments:  Patient is not able to fully straighten left arm, she has tenderness of the radial head of the left arm, no olecranon tenderness or swelling, no tenderness or deformity of the humerus or the distal forearm or wrist  ?Skin: ?   General: Skin is warm and dry.  ?Neurological:  ?   General: No focal deficit present.  ?   Mental Status: She is alert and oriented to person, place, and time. Mental status is at baseline.  ?   Sensory: No sensory deficit.  ?   Motor: No weakness.  ?Psychiatric:     ?   Mood and Affect: Mood normal.     ?   Behavior: Behavior normal.  ? ? ?ED Results / Procedures / Treatments   ?Labs ?(all labs ordered are listed, but only abnormal results are displayed) ?Labs Reviewed - No data to display ? ?EKG ?None ? ?Radiology ?DG ELBOW COMPLETE LEFT (3+VIEW) ? ?Result Date: 07/26/2021 ?CLINICAL DATA:  Fall landing on left arm. EXAM: LEFT ELBOW - COMPLETE 3+ VIEW COMPARISON:  None Available. FINDINGS: Examination demonstrates a displaced anterior and posterior fat pad. Suggestion of a subtle nondisplaced radial head fracture. Remainder of the exam is unremarkable. IMPRESSION: Suggestion of a subtle nondisplaced radial  head fracture. CT may be helpful to confirm this finding. Electronically Signed   By: Marin Olp M.D.   On: 07/26/2021 09:55   ? ?Procedures ?Procedures  ? ? ?Medications Ordered in ED ?Medications - No data to display ? ?ED Course/ Medical Decision Making/ A&P ?Clinical Course as of 07/26/21 1336  ?Sun Jul 26, 2021  ?1010 X-ray concerning for possible subtle radial head fracture which does correlate with her tenderness on exam, and I agree this is likely the cause.  Patient will be placed in a posterior long-arm splint, given orthopedic follow-up. [MT]  ?57 Pt already sees Dr Caralyn Guile for RIGHT elbow prior surgery - she can f/u with him again for this LEFT elbow injury. [MT]  ?  ?Clinical Course User Index ?[MT] Wyvonnia Dusky, MD  ? ?                        ?Medical Decision  Making ?Amount and/or Complexity of Data Reviewed ?Radiology: ordered. ? ? ?Patient is here with a fall and isolated injury to the left elbow.  She is neurovascularly intact.  X-rays ordered of the elbow and personally reviewed and interpreted, showing possible nondisplaced radial head fracture. ? ? ? ? ? ? ? ?Final Clinical Impression(s) / ED Diagnoses ?Final diagnoses:  ?Closed nondisplaced fracture of head of left radius, initial encounter  ? ? ?Rx / DC Orders ?ED Discharge Orders   ? ? None  ? ?  ? ? ?  ?Wyvonnia Dusky, MD ?07/26/21 1336 ? ?

## 2021-07-26 NOTE — ED Triage Notes (Signed)
Pt had a mechanical fall and  twisted her right ankle and came down on her left arm. Pt is Not on Thinners. Pt did not hit head.  ?

## 2021-07-28 DIAGNOSIS — S52125A Nondisplaced fracture of head of left radius, initial encounter for closed fracture: Secondary | ICD-10-CM | POA: Diagnosis not present

## 2021-07-28 DIAGNOSIS — M25522 Pain in left elbow: Secondary | ICD-10-CM | POA: Diagnosis not present

## 2021-08-20 DIAGNOSIS — M25622 Stiffness of left elbow, not elsewhere classified: Secondary | ICD-10-CM | POA: Diagnosis not present

## 2021-08-20 DIAGNOSIS — S52125A Nondisplaced fracture of head of left radius, initial encounter for closed fracture: Secondary | ICD-10-CM | POA: Diagnosis not present

## 2021-09-25 DIAGNOSIS — M25522 Pain in left elbow: Secondary | ICD-10-CM | POA: Diagnosis not present

## 2021-09-28 ENCOUNTER — Other Ambulatory Visit (HOSPITAL_COMMUNITY): Payer: Self-pay

## 2021-10-23 ENCOUNTER — Encounter: Payer: Self-pay | Admitting: Physician Assistant

## 2021-10-23 ENCOUNTER — Other Ambulatory Visit (HOSPITAL_COMMUNITY): Payer: Self-pay

## 2021-10-23 ENCOUNTER — Ambulatory Visit (INDEPENDENT_AMBULATORY_CARE_PROVIDER_SITE_OTHER): Payer: BC Managed Care – PPO | Admitting: Physician Assistant

## 2021-10-23 VITALS — BP 128/86 | HR 79 | Temp 97.2°F | Ht 65.0 in | Wt 189.2 lb

## 2021-10-23 DIAGNOSIS — Z23 Encounter for immunization: Secondary | ICD-10-CM

## 2021-10-23 DIAGNOSIS — D485 Neoplasm of uncertain behavior of skin: Secondary | ICD-10-CM

## 2021-10-23 DIAGNOSIS — R7303 Prediabetes: Secondary | ICD-10-CM

## 2021-10-23 DIAGNOSIS — E038 Other specified hypothyroidism: Secondary | ICD-10-CM

## 2021-10-23 DIAGNOSIS — Z0000 Encounter for general adult medical examination without abnormal findings: Secondary | ICD-10-CM

## 2021-10-23 DIAGNOSIS — E782 Mixed hyperlipidemia: Secondary | ICD-10-CM

## 2021-10-23 LAB — CBC WITH DIFFERENTIAL/PLATELET
Basophils Absolute: 0 10*3/uL (ref 0.0–0.1)
Basophils Relative: 0.5 % (ref 0.0–3.0)
Eosinophils Absolute: 0.1 10*3/uL (ref 0.0–0.7)
Eosinophils Relative: 1.8 % (ref 0.0–5.0)
HCT: 42.8 % (ref 36.0–46.0)
Hemoglobin: 14.1 g/dL (ref 12.0–15.0)
Lymphocytes Relative: 27.1 % (ref 12.0–46.0)
Lymphs Abs: 1.4 10*3/uL (ref 0.7–4.0)
MCHC: 33 g/dL (ref 30.0–36.0)
MCV: 89.2 fl (ref 78.0–100.0)
Monocytes Absolute: 0.4 10*3/uL (ref 0.1–1.0)
Monocytes Relative: 8.5 % (ref 3.0–12.0)
Neutro Abs: 3.3 10*3/uL (ref 1.4–7.7)
Neutrophils Relative %: 62.1 % (ref 43.0–77.0)
Platelets: 178 10*3/uL (ref 150.0–400.0)
RBC: 4.79 Mil/uL (ref 3.87–5.11)
RDW: 14.1 % (ref 11.5–15.5)
WBC: 5.3 10*3/uL (ref 4.0–10.5)

## 2021-10-23 LAB — COMPREHENSIVE METABOLIC PANEL
ALT: 13 U/L (ref 0–35)
AST: 13 U/L (ref 0–37)
Albumin: 4.2 g/dL (ref 3.5–5.2)
Alkaline Phosphatase: 101 U/L (ref 39–117)
BUN: 18 mg/dL (ref 6–23)
CO2: 31 mEq/L (ref 19–32)
Calcium: 10 mg/dL (ref 8.4–10.5)
Chloride: 105 mEq/L (ref 96–112)
Creatinine, Ser: 0.9 mg/dL (ref 0.40–1.20)
GFR: 72.16 mL/min (ref >60.00)
Glucose, Bld: 114 mg/dL — ABNORMAL HIGH (ref 70–99)
Potassium: 4.1 mEq/L (ref 3.5–5.1)
Sodium: 142 mEq/L (ref 135–145)
Total Bilirubin: 0.7 mg/dL (ref 0.2–1.2)
Total Protein: 7 g/dL (ref 6.0–8.3)

## 2021-10-23 LAB — LIPID PANEL
Cholesterol: 349 mg/dL — ABNORMAL HIGH (ref 0–200)
HDL: 81.7 mg/dL (ref >39.00)
LDL Cholesterol: 253 mg/dL — ABNORMAL HIGH (ref 0–99)
NonHDL: 267.47
Total CHOL/HDL Ratio: 4
Triglycerides: 72 mg/dL (ref 0.0–149.0)
VLDL: 14.4 mg/dL (ref 0.0–40.0)

## 2021-10-23 LAB — TSH: TSH: 1.89 u[IU]/mL (ref 0.35–5.50)

## 2021-10-23 LAB — HEMOGLOBIN A1C: Hgb A1c MFr Bld: 6.1 % (ref 4.6–6.5)

## 2021-10-23 MED ORDER — LEVOTHYROXINE SODIUM 75 MCG PO TABS
75.0000 ug | ORAL_TABLET | Freq: Every day | ORAL | 3 refills | Status: DC
  Filled 2021-10-23 – 2022-01-26 (×2): qty 90, 90d supply, fill #0
  Filled 2022-05-05: qty 30, 30d supply, fill #1
  Filled 2022-05-31: qty 90, 90d supply, fill #2
  Filled 2022-09-13: qty 90, 90d supply, fill #3

## 2021-10-23 MED ORDER — PANTOPRAZOLE SODIUM 20 MG PO TBEC
20.0000 mg | DELAYED_RELEASE_TABLET | Freq: Every day | ORAL | 3 refills | Status: AC
  Filled 2021-10-23: qty 90, 90d supply, fill #0

## 2021-10-23 NOTE — Progress Notes (Signed)
Subjective:    Patient ID: Brenda Frost, female    DOB: 1965-09-26, 56 y.o.   MRN: [id number]  Chief Complaint  Patient presents with   Annual Exam    Pt being seen for annual CPE; pt is fasting this morning; no concerns to discuss;     HPI Patient is in today for annual exam.  Acute concerns: Intermittent GERD, but just monitors what she eats   Health maintenance: Lifestyle/ exercise: Enjoys riding Peloton bike  Nutrition: Husband does a lot of cooking; well-balanced diet, a lot of vegetables Mental health: Some stress, but overall well  Caffeine: Coffee every morning  Sleep: Still has hot flashes at night; doesn't sleep well, difficulty with thermoregulation Substance use: None ETOH: None  Sexual activity: Monogamous, no concerns Immunizations: Doing first Shingrix today  Colonoscopy: UTD Pap: UTD Mammogram: UTD   Past Medical History:  Diagnosis Date   Hyperlipidemia    Hypothyroidism     Past Surgical History:  Procedure Laterality Date   CESAREAN SECTION     ORIF ELBOW FRACTURE Right 02/15/2016   Procedure: OPEN REDUCTION INTERNAL FIXATION (ORIF) ELBOW/OLECRANON FRACTURE;  Surgeon: Iran Planas, MD;  Location: [facility];  Service: Orthopedics;  Laterality: Right;    Family History  Problem Relation Age of Onset   Diabetes Mother    Hyperlipidemia Mother    Hypertension Mother    Kidney disease Mother    Heart disease Maternal Grandmother    CAD Neg Hx    Breast cancer Neg Hx    Colon cancer Neg Hx    Esophageal cancer Neg Hx    Inflammatory bowel disease Neg Hx    Liver disease Neg Hx    Pancreatic cancer Neg Hx    Rectal cancer Neg Hx    Stomach cancer Neg Hx     Social History   Tobacco Use   Smoking status: Never   Smokeless tobacco: Never  Vaping Use   Vaping Use: Never used  Substance Use Topics   Alcohol use: Not Currently    Alcohol/week: 1.0 standard drink of alcohol    Types: 1 Cans of beer per week   Drug use: No     No Known  Allergies  Review of Systems NEGATIVE UNLESS OTHERWISE INDICATED IN HPI      Objective:     BP 128/86 (BP Location: Right Arm)   Pulse 79   Temp (!) 97.2 F (36.2 C) (Temporal)   Ht '5\' 5"'$  (1.651 m)   Wt 189 lb 3.2 oz (85.8 kg)   LMP 04/25/2016   SpO2 97%   BMI 31.48 kg/m   Wt Readings from Last 3 Encounters:  10/23/21 189 lb 3.2 oz (85.8 kg)  07/26/21 186 lb (84.4 kg)  03/02/21 188 lb 8 oz (85.5 kg)    BP Readings from Last 3 Encounters:  10/23/21 128/86  07/26/21 124/83  03/02/21 119/81     Physical Exam Vitals and nursing note reviewed.  Constitutional:      Appearance: Normal appearance. She is obese. She is not toxic-appearing.  HENT:     Head: Normocephalic and atraumatic.     Right Ear: Tympanic membrane, ear canal and external ear normal.     Left Ear: Tympanic membrane, ear canal and external ear normal.     Nose: Nose normal.     Mouth/Throat:     Mouth: Mucous membranes are moist.  Eyes:     Extraocular Movements: Extraocular movements intact.  Conjunctiva/sclera: Conjunctivae normal.     Pupils: Pupils are equal, round, and reactive to light.  Cardiovascular:     Rate and Rhythm: Normal rate and regular rhythm.     Pulses: Normal pulses.     Heart sounds: Normal heart sounds.  Pulmonary:     Effort: Pulmonary effort is normal.     Breath sounds: Normal breath sounds.  Abdominal:     General: Abdomen is flat. Bowel sounds are normal.     Palpations: Abdomen is soft.  Musculoskeletal:        General: Normal range of motion.     Cervical back: Normal range of motion and neck supple.  Skin:    General: Skin is warm and dry.     Comments: Raised small brown nevus left side of back. Right lateral back there is a flat hazy darker pigmented lesion with irregular borders.    Neurological:     General: No focal deficit present.     Mental Status: She is alert and oriented to person, place, and time.  Psychiatric:        Mood and Affect: Mood  normal.        Behavior: Behavior normal.        Thought Content: Thought content normal.        Judgment: Judgment normal.        Assessment & Plan:   Problem List Items Addressed This Visit       Other   Hyperlipidemia   Relevant Orders   Lipid panel   Other Visit Diagnoses     Encounter for annual physical exam    -  Primary   Relevant Orders   CBC with Differential/Platelet   Comprehensive metabolic panel   Lipid panel   TSH   Hemoglobin A1c   Prediabetes       Relevant Orders   Comprehensive metabolic panel   Hemoglobin A1c   Other specified hypothyroidism       Relevant Medications   levothyroxine (SYNTHROID) 75 MCG tablet   Other Relevant Orders   TSH   Neoplasm of uncertain behavior of skin            Meds ordered this encounter  Medications   levothyroxine (SYNTHROID) 75 MCG tablet    Sig: Take 1 tablet (75 mcg total) by mouth daily.    Dispense:  90 tablet    Refill:  3   pantoprazole (PROTONIX) 20 MG tablet    Sig: Take 1 tablet (20 mg total) by mouth daily.    Dispense:  90 tablet    Refill:  3   Plan: Age-appropriate screening and counseling performed today. Will check labs and call with results. Preventive measures discussed and printed in AVS for patient.  -1st shingrix today -Keep up good work with lifestyle -Rx's refilled  Patient Counseling: '[x]'$   Nutrition: Stressed importance of moderation in sodium/caffeine intake, saturated fat and cholesterol, caloric balance, sufficient intake of fresh fruits, vegetables, and fiber.  '[x]'$   Stressed the importance of regular exercise.   '[]'$   Substance Abuse: Discussed cessation/primary prevention of tobacco, alcohol, or other drug use; driving or other dangerous activities under the influence; availability of treatment for abuse.   '[x]'$   Injury prevention: Discussed safety belts, safety helmets, smoke detector, smoking near bedding or upholstery.   '[]'$   Sexuality: Discussed sexually transmitted  diseases, partner selection, use of condoms, avoidance of unintended pregnancy  and contraceptive alternatives.   '[x]'$   Dental health: Discussed importance  of regular tooth brushing, flossing, and dental visits.  '[x]'$   Health maintenance and immunizations reviewed. Please refer to Health maintenance section.     Return in about 1 year (around 10/24/2022) for CPE, fasting labs .    Shadae Reino M Jameeka Marcy, PA-C

## 2021-10-23 NOTE — Patient Instructions (Addendum)
*  Fasting labs today *Scheduled for skin lesion removals & 2nd Shingrix vaccine in 2-6 months time  *Schedule for next CPE in one year   Keep up the good work!!

## 2021-10-23 NOTE — Addendum Note (Signed)
Addended by: Verlon Setting on: 10/23/2021 08:48 AM   Modules accepted: Orders

## 2021-10-28 ENCOUNTER — Encounter (INDEPENDENT_AMBULATORY_CARE_PROVIDER_SITE_OTHER): Payer: Self-pay

## 2021-10-29 ENCOUNTER — Other Ambulatory Visit: Payer: Self-pay

## 2021-10-29 DIAGNOSIS — E782 Mixed hyperlipidemia: Secondary | ICD-10-CM

## 2021-10-30 ENCOUNTER — Telehealth: Payer: Self-pay | Admitting: Physician Assistant

## 2021-10-30 NOTE — Telephone Encounter (Signed)
Patient Name: Brenda Frost Gender: Female DOB: 10-19-1965 Age: 56 Y 32 M 27 D Return Phone Number: [phone] (Primary), [phone] (Secondary) Address: City/ State/ Zip: [place]  [address] Client [facility] Client Site [facility] Paediatric nurse, Freight forwarder- PA Contact Type Call Who Is Calling Patient / Member / Family / Caregiver Call Type Triage / Clinical Relationship To Patient Self Return Phone Number [phone] (Primary) Chief Complaint CHEST PAIN - pain, pressure, heaviness or tightness Reason for Call Symptomatic / Request for [place] states they had gotten a shingle vaccine and now experiencing chest tightness, muscle pain and unable to sleep. Translation No Nurse Assessment Nurse: Ellery Plunk, RN, Danica Date/Time (Eastern Time): 10/30/2021 11:04:12 AM Confirm and document reason for call. If symptomatic, describe symptoms. ---Caller states every night she will get pain in her back and wrap around the front to her diaphragm area. when she moves it will be really tight like a muscle soreness like she has been working out. Breasts feel heavy. Started after she got her shingles vaccine on Friday last week. Does the patient have any new or worsening symptoms? ---Yes Will a triage be completed? ---Yes Related visit to physician within the last 2 weeks? ---Yes Does the PT have any chronic conditions? (i.e. diabetes, asthma, this includes High risk factors for pregnancy, etc.) ---Yes List chronic conditions. ---high A1C and cholesterol Is this a behavioral health or substance abuse call? ---No Guidelines Guideline Title Affirmed Question Affirmed Notes Nurse Date/Time (Eastern Time) Chest Pain SEVERE chest pain Bringas, RN, Danica 10/30/2021 11:07:18  Disp. Time Eilene Ghazi Time) Disposition Final User 10/30/2021 11:14:07 AM Go to ED Now Yes Ellery Plunk, RN,  Fredric Dine Final Disposition 10/30/2021 11:14:07 AM Go to ED Now Yes Bringas, RN, Fredric Dine Caller Disagree/Comply Disagree Caller Understands Yes PreDisposition InappropriateToAsk Care Advice Given Per Guideline GO TO ED NOW: * You need to be seen in the Emergency Department. * Leave now. Drive carefully. CARE ADVICE given per Chest Pain (Adult) guideline. Comments User: Harriette Bouillon, RN Date/Time Eilene Ghazi Time): 10/30/2021 11:11:51 AM HR 81 User: Harriette Bouillon, RN Date/Time Eilene Ghazi Time): 10/30/2021 11:15:33 AM caller states she does not feel like her symptoms are bad enough to go to ER. will just go to UC or somewhere else. Referrals GO TO FACILITY REFUSED

## 2021-10-30 NOTE — Telephone Encounter (Signed)
Noted, will be watching for triage notes, thanks.

## 2021-10-30 NOTE — Telephone Encounter (Signed)
FYI

## 2021-10-30 NOTE — Telephone Encounter (Signed)
Patient states she has not been feeling well for 1 week since receiving Shingles vaccine-sore muscles- especially at night-tightness that expands from back to front chest area-unable to sleep.  Transferred call to Triage

## 2021-11-02 ENCOUNTER — Other Ambulatory Visit (HOSPITAL_COMMUNITY): Payer: Self-pay

## 2021-11-02 NOTE — Telephone Encounter (Signed)
Triage note 

## 2021-11-20 ENCOUNTER — Encounter: Payer: Self-pay | Admitting: Physician Assistant

## 2021-12-09 ENCOUNTER — Encounter: Payer: Self-pay | Admitting: Physician Assistant

## 2021-12-10 ENCOUNTER — Ambulatory Visit: Payer: BC Managed Care – PPO | Admitting: Physician Assistant

## 2021-12-10 ENCOUNTER — Encounter: Payer: Self-pay | Admitting: Physician Assistant

## 2021-12-10 ENCOUNTER — Other Ambulatory Visit (HOSPITAL_COMMUNITY): Payer: Self-pay

## 2021-12-10 VITALS — BP 100/69 | HR 70 | Temp 97.3°F | Ht 65.0 in | Wt 179.0 lb

## 2021-12-10 DIAGNOSIS — L03115 Cellulitis of right lower limb: Secondary | ICD-10-CM | POA: Diagnosis not present

## 2021-12-10 DIAGNOSIS — W57XXXA Bitten or stung by nonvenomous insect and other nonvenomous arthropods, initial encounter: Secondary | ICD-10-CM | POA: Diagnosis not present

## 2021-12-10 DIAGNOSIS — S80861A Insect bite (nonvenomous), right lower leg, initial encounter: Secondary | ICD-10-CM | POA: Diagnosis not present

## 2021-12-10 MED ORDER — CEPHALEXIN 500 MG PO CAPS
500.0000 mg | ORAL_CAPSULE | Freq: Three times a day (TID) | ORAL | 0 refills | Status: DC
  Filled 2021-12-10: qty 15, 5d supply, fill #0

## 2021-12-10 NOTE — Progress Notes (Signed)
Subjective:    Patient ID: Brenda Frost, female    DOB: 1965-09-23, 56 y.o.   MRN: [id number]  Chief Complaint  Patient presents with   Follow-up    Pt is here for possible spider bite    HPI Patient is in today for right inner thigh insect bite x 4 days. Pt states that she was cleaning this weekend and felt something sting her leg twice.  She looked down and she had 2 raised areas on her inner leg.  Since then the area has become bruised and red yesterday the area was more red and irritated and warm to the touch.  Today seems better.  She did put some steroid and Benadryl cream on it yesterday.  Some itching and minimal pain.  No other symptoms at this time.  Past Medical History:  Diagnosis Date   Hyperlipidemia    Hypothyroidism     Past Surgical History:  Procedure Laterality Date   CESAREAN SECTION     ORIF ELBOW FRACTURE Right 02/15/2016   Procedure: OPEN REDUCTION INTERNAL FIXATION (ORIF) ELBOW/OLECRANON FRACTURE;  Surgeon: Iran Planas, MD;  Location: [facility];  Service: Orthopedics;  Laterality: Right;    Family History  Problem Relation Age of Onset   Diabetes Mother    Hyperlipidemia Mother    Hypertension Mother    Kidney disease Mother    Heart disease Maternal Grandmother    CAD Neg Hx    Breast cancer Neg Hx    Colon cancer Neg Hx    Esophageal cancer Neg Hx    Inflammatory bowel disease Neg Hx    Liver disease Neg Hx    Pancreatic cancer Neg Hx    Rectal cancer Neg Hx    Stomach cancer Neg Hx     Social History   Tobacco Use   Smoking status: Never   Smokeless tobacco: Never  Vaping Use   Vaping Use: Never used  Substance Use Topics   Alcohol use: Not Currently    Alcohol/week: 1.0 standard drink of alcohol    Types: 1 Cans of beer per week   Drug use: No     No Known Allergies  Review of Systems NEGATIVE UNLESS OTHERWISE INDICATED IN HPI      Objective:     BP 100/69 (BP Location: Left Arm, Patient Position: Sitting)   Pulse 70    Temp (!) 97.3 F (36.3 C) (Temporal)   Ht '5\' 5"'$  (1.651 m)   Wt 179 lb (81.2 kg)   LMP 04/25/2016   SpO2 98%   BMI 29.79 kg/m   Wt Readings from Last 3 Encounters:  12/10/21 179 lb (81.2 kg)  10/23/21 189 lb 3.2 oz (85.8 kg)  07/26/21 186 lb (84.4 kg)    BP Readings from Last 3 Encounters:  12/10/21 100/69  10/23/21 128/86  07/26/21 124/83     Physical Exam Constitutional:      Appearance: Normal appearance.  Skin:    Comments: Large patch inner thigh of ecchymosis, minimal redness, no areas of streaking; no blisters or pustules noted. No abscess.  Neurological:     General: No focal deficit present.     Mental Status: She is alert and oriented to person, place, and time.        Assessment & Plan:  Cellulitis of right leg  Insect bite of right lower extremity, initial encounter  Other orders -     Cephalexin; Take 1 capsule (500 mg total) by mouth 3 (three)  times daily for 5 days  Dispense: 15 capsule; Refill: 0   Symptoms seem consistent with insect sting, probably a local T-cell reaction.  She is doing better today than yesterday.  With that being considered, she may just want to continue on steroid cream at this time.  She can monitor the area closely.  If she notices any increased redness or pain starts to develop, she can start on the Keflex that I have given her.  Patient to call with updates and any concerns if they come up.   This note was prepared with assistance of Systems analyst. Occasional wrong-word or sound-a-like substitutions may have occurred due to the inherent limitations of voice recognition software.      Kayla Weekes M Edyn Qazi, PA-C

## 2022-01-26 ENCOUNTER — Encounter (HOSPITAL_BASED_OUTPATIENT_CLINIC_OR_DEPARTMENT_OTHER): Payer: Self-pay | Admitting: Internal Medicine

## 2022-01-26 ENCOUNTER — Ambulatory Visit (HOSPITAL_BASED_OUTPATIENT_CLINIC_OR_DEPARTMENT_OTHER): Payer: BC Managed Care – PPO | Admitting: Internal Medicine

## 2022-01-26 ENCOUNTER — Telehealth: Payer: Self-pay | Admitting: Internal Medicine

## 2022-01-26 ENCOUNTER — Other Ambulatory Visit (HOSPITAL_COMMUNITY): Payer: Self-pay

## 2022-01-26 VITALS — BP 102/76 | HR 76 | Ht 65.0 in | Wt 172.2 lb

## 2022-01-26 DIAGNOSIS — E7800 Pure hypercholesterolemia, unspecified: Secondary | ICD-10-CM | POA: Diagnosis not present

## 2022-01-26 DIAGNOSIS — E7801 Familial hypercholesterolemia: Secondary | ICD-10-CM | POA: Diagnosis not present

## 2022-01-26 NOTE — Telephone Encounter (Signed)
Genetic test for dyslipidemia/ASCVD panel ordered ([facility]) Cheek swab completed in office Specimen and necessary paperwork mailed. ID: [id number]

## 2022-01-26 NOTE — Progress Notes (Signed)
LIPID CLINIC CONSULT NOTE  Chief Complaint:  Follow-up dyslipidemia  Primary Care Physician: Allwardt, Randa Evens, PA-C  Primary Cardiologist:  None  HPI:  Brenda Frost is a 56 y.o. female who is being seen today for the evaluation of dyslipidemia at the request of Allwardt, Alyssa M, PA-C. 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 She is of [place] ancestry and has been referred for evaluation of marked dyslipidemia. More recently her lipids have demonstrated significant elevation in total cholesterol at 307, triglycerides 67, HDL 90 and LDL of 203. She reports that this is actually been her lipid profile since her teenage years when she first had her cholesterol tested. She has no history of cardiovascular events and it sounds like no significant family history of coronary disease. She is not aware of either of her parents having abnormal cholesterol although these numbers are suggestive of familial hyperlipidemia. She has not been previously on any therapy but was prescribed rosuvastatin 10 mg daily. She has not started the medication yet pending this visit. Currently she is asymptomatic, denying chest pain or worsening shortness of breath. She has some issues with pancreatitis and/or abdominal discomfort which she seems to be managing with diet.  01/26/2022  Brenda Frost returns today for follow-up of dyslipidemia.  I last saw her back in 2021.  At that time we had discussed increasing her statin medication up to 20 mg due to her elevated cholesterol.  Subsequently she has discontinued taking the statin.  She said she was briefly on a keto diet with her husband but noted that her cholesterol had gone up significantly.  Her labs as of August showed total cholesterol 349, triglycerides 72, HDL 81 and LDL 253.  She did have a calcium score that was 0 in 2021.  We discussed this in some detail today including the fact that this may not  necessarily be associated with low cardiovascular risk in patients with familial hyperlipidemia and that treatment with greater than 50% reduction in her lipids is recommended.  PMHx:  Past Medical History:  Diagnosis Date   Hyperlipidemia    Hypothyroidism     Past Surgical History:  Procedure Laterality Date   CESAREAN SECTION     ORIF ELBOW FRACTURE Right 02/15/2016   Procedure: OPEN REDUCTION INTERNAL FIXATION (ORIF) ELBOW/OLECRANON FRACTURE;  Surgeon: Iran Planas, MD;  Location: [place];  Service: Orthopedics;  Laterality: Right;    FAMHx:  Family History  Problem Relation Age of Onset   Diabetes Mother    Hyperlipidemia Mother    Hypertension Mother    Kidney disease Mother    Heart disease Maternal Grandmother    CAD Neg Hx    Breast cancer Neg Hx    Colon cancer Neg Hx    Esophageal cancer Neg Hx    Inflammatory bowel disease Neg Hx    Liver disease Neg Hx    Pancreatic cancer Neg Hx    Rectal cancer Neg Hx    Stomach cancer Neg Hx     SOCHx:   reports that she has never smoked. She has never used smokeless tobacco. She reports that she does not currently use alcohol after a past usage of about 1.0 standard drink of alcohol per week. She reports that she does not use drugs.  ALLERGIES:  No Known Allergies  ROS: Pertinent items noted in HPI and remainder of comprehensive ROS otherwise negative.  HOME MEDS: Current Outpatient Medications  on File Prior to Visit  Medication Sig Dispense Refill   levothyroxine (SYNTHROID) 75 MCG tablet Take 1 tablet (75 mcg total) by mouth daily. 90 tablet 3   pantoprazole (PROTONIX) 20 MG tablet Take 1 tablet (20 mg total) by mouth daily. 90 tablet 3   No current facility-administered medications on file prior to visit.    LABS/IMAGING: No results found for this or any previous visit (from the past 48 hour(s)). No results found.  LIPID PANEL:    Component Value Date/Time   CHOL 349 (H) 10/23/2021 0849   CHOL 293 (H)  12/08/2017 0820   TRIG 72.0 10/23/2021 0849   HDL 81.70 10/23/2021 0849   HDL 86 12/08/2017 0820   CHOLHDL 4 10/23/2021 0849   VLDL 14.4 10/23/2021 0849   LDLCALC 253 (H) 10/23/2021 0849   LDLCALC 192 (H) 12/08/2017 0820    WEIGHTS: Wt Readings from Last 3 Encounters:  01/26/22 172 lb 3.2 oz (78.1 kg)  12/10/21 179 lb (81.2 kg)  10/23/21 189 lb 3.2 oz (85.8 kg)    VITALS: BP 102/76   Pulse 76   Ht '5\' 5"'$  (1.651 m)   Wt 172 lb 3.2 oz (78.1 kg)   LMP 04/25/2016   BMI 28.66 kg/m   EXAM: Deferred  EKG: Deferred  ASSESSMENT: Probable familial hyperlipidemia 0 CAC score (2021)  PLAN: 1.   Brenda Frost has discontinued her statin therapy.  She was briefly on a keto diet but says that she has recently changed her diet and lost more weight.  She is eager to see if this is helped her cholesterol.  Her LDL was quite high at 253 back in August.  This is certainly a familial hyperlipidemia.  We discussed genetic testing which I think could help further identify whether or not she has a pathogenic variant.  She is in agreement with this.  She understands that while it is likely to be covered by insurance, that if it were not covered the cost could be up to $299.  I suspect she will need therapy.  This may either consist of a retrial of statin therapy and or PCSK9 inhibitor.  I would also like to check an LP(a).  Follow-up based on her therapies.  Pixie Casino, MD, [facility], [place] Director of the Advanced Lipid Disorders &  Cardiovascular Risk Reduction Clinic Diplomate of the American Board of Clinical Lipidology Attending Cardiologist  Direct Dial: [phone]  Fax: [phone]  Website:  [email or web address] 01/26/2022, 8:10 AM

## 2022-01-26 NOTE — Patient Instructions (Signed)
Medication Instructions:  NO CHANGES today -- will depend on lab results + genetic test  *If you need a refill on your cardiac medications before your next appointment, please call your pharmacy*   Lab Work: NMR lipoprofile, LPA today   If you have labs (blood work) drawn today and your tests are completely normal, you will receive your results only by: [place] (if you have MyChart) OR A paper copy in the mail If you have any lab test that is abnormal or we need to change your treatment, we will call you to review the results.   Testing/Procedures: Genetic Test - results available in about 2-3 weeks    Follow-Up: At [facility], you and your health needs are our priority.  As part of our continuing mission to provide you with exceptional heart care, we have created designated Provider Care Teams.  These Care Teams include your primary Cardiologist (physician) and Advanced Practice Providers (APPs -  Physician Assistants and Nurse Practitioners) who all work together to provide you with the care you need, when you need it.  We recommend signing up for the patient portal called "MyChart".  Sign up information is provided on this After Visit Summary.  MyChart is used to connect with patients for Virtual Visits (Telemedicine).  Patients are able to view lab/test results, encounter notes, upcoming appointments, etc.  Non-urgent messages can be sent to your provider as well.   To learn more about what you can do with MyChart, go to [email or web address].    Your next appointment:     4 months with Dr. Debara Pickett

## 2022-01-27 ENCOUNTER — Encounter: Payer: Self-pay | Admitting: Internal Medicine

## 2022-01-27 DIAGNOSIS — E7800 Pure hypercholesterolemia, unspecified: Secondary | ICD-10-CM

## 2022-01-27 DIAGNOSIS — E7841 Elevated Lipoprotein(a): Secondary | ICD-10-CM

## 2022-01-27 LAB — NMR, LIPOPROFILE
Cholesterol, Total: 352 mg/dL — ABNORMAL HIGH (ref 100–199)
HDL Particle Number: 35.5 umol/L (ref >=30.5)
HDL-C: 84 mg/dL (ref >39)
LDL Particle Number: 2450 nmol/L — ABNORMAL HIGH (ref <1000)
LDL Size: 21.8 nm (ref >20.5)
LDL-C (NIH Calc): 259 mg/dL — ABNORMAL HIGH (ref 0–99)
LP-IR Score: <25 (ref <=45)
Small LDL Particle Number: <90 nmol/L (ref <=527)
Triglycerides: 67 mg/dL (ref 0–149)

## 2022-01-27 LAB — LIPOPROTEIN A (LPA): Lipoprotein (a): 226.4 nmol/L — ABNORMAL HIGH (ref <75.0)

## 2022-01-28 ENCOUNTER — Telehealth: Payer: Self-pay | Admitting: Internal Medicine

## 2022-01-28 ENCOUNTER — Other Ambulatory Visit (HOSPITAL_COMMUNITY): Payer: Self-pay

## 2022-01-28 MED ORDER — ROSUVASTATIN CALCIUM 20 MG PO TABS
20.0000 mg | ORAL_TABLET | Freq: Every day | ORAL | 3 refills | Status: DC
  Filled 2022-01-28: qty 90, 90d supply, fill #0
  Filled 2022-05-21: qty 90, 90d supply, fill #1
  Filled 2022-09-13: qty 90, 90d supply, fill #2
  Filled 2022-12-31 – 2023-01-17 (×2): qty 90, 90d supply, fill #3

## 2022-01-28 NOTE — Telephone Encounter (Signed)
Left message for pt to call.

## 2022-01-28 NOTE — Telephone Encounter (Signed)
Patient returning call from Dr. Debara Pickett for lab results.

## 2022-02-10 NOTE — Telephone Encounter (Signed)
Spoke with patient who stated she spoke with Dr. Debara Pickett and received the results of blood work and is taking Crestor '20mg'$  daily. She is awaiting genetic testing.

## 2022-02-10 NOTE — Telephone Encounter (Addendum)
Results not posted yet. MD will reach out to patient when they are available

## 2022-02-28 ENCOUNTER — Encounter: Payer: Self-pay | Admitting: Internal Medicine

## 2022-03-03 ENCOUNTER — Encounter: Payer: Self-pay | Admitting: Family

## 2022-03-03 ENCOUNTER — Other Ambulatory Visit (HOSPITAL_COMMUNITY): Payer: Self-pay

## 2022-03-03 ENCOUNTER — Ambulatory Visit: Payer: BC Managed Care – PPO | Admitting: Family

## 2022-03-03 VITALS — BP 103/72 | HR 88 | Temp 98.0°F | Ht 65.0 in | Wt 163.2 lb

## 2022-03-03 DIAGNOSIS — R051 Acute cough: Secondary | ICD-10-CM

## 2022-03-03 DIAGNOSIS — J0110 Acute frontal sinusitis, unspecified: Secondary | ICD-10-CM

## 2022-03-03 MED ORDER — AMOXICILLIN-POT CLAVULANATE 875-125 MG PO TABS
1.0000 | ORAL_TABLET | Freq: Two times a day (BID) | ORAL | 0 refills | Status: DC
  Filled 2022-03-03: qty 10, 5d supply, fill #0

## 2022-03-03 MED ORDER — GUAIFENESIN-CODEINE 100-10 MG/5ML PO SYRP
5.0000 mL | ORAL_SOLUTION | Freq: Three times a day (TID) | ORAL | 0 refills | Status: DC | PRN
  Filled 2022-03-03: qty 120, 8d supply, fill #0

## 2022-03-03 NOTE — Patient Instructions (Signed)
It was very nice to see you today!   And great to catch up after almost 30 years, yikes...;-)   Augmentin and Cheratussin syrup has been sent to [facility]. Remember Cheratussin has Codeine, be careful during the day, mostly to help with sleep. Use OTC saline nasal spray 3x/day to help disinfect & moisturize your sinuses - (good for all winter also) Start OTC antihistamine, generic Xyzal is good, to help with runny nose & postnasal drip. Humidifier overnight is always beneficial to provide moisture and avoid morning congestion.   Have a wonderful Christmas!           PLEASE NOTE:  If you had any lab tests please let us know if you have not heard back within a few days. You may see your results on MyChart before we have a chance to review them but we will give you a call once they are reviewed by [place]. If we ordered any referrals today, please let us know if you have not heard from their office within the next week.

## 2022-03-03 NOTE — Progress Notes (Signed)
Patient ID: Brenda Frost, female    DOB: [birth year], 56 y.o.   MRN: [id number]  Chief Complaint  Patient presents with   Sinus Problem    sx x 5d    HPI:      URI sx:  Pt c/o Nasal/ congestion and head/behind eye pressure, Cough with dark tan mucus and drainage, Present for 5 days. Denies fever. Reports scratchy throat mostly d/t coughing she thinks. Covid negative this morning, flu shot this season. Has tried cough syrup which did not help.         Assessment & Plan:  1. Acute non-recurrent frontal sinusitis sending Augmentin, advised on use & SE, take generic OTC antihistamine (can't tolerate Sudafed), use nasal saline spray tid, humidifier overnight, drink 2L water qd.  - amoxicillin-clavulanate (AUGMENTIN) 875-125 MG tablet; Take 1 tablet by mouth 2 (two) times daily after a meal.  Dispense: 10 tablet; Refill: 0  2. Acute cough sending Cheratussin syrup mainly for sleep qhs, can take during day if not driving, increase water intake, oral antihistamine qd to help postnasal drip.   - guaiFENesin-codeine (ROBITUSSIN AC) 100-10 MG/5ML syrup; Take 5 mLs by mouth 3 (three) times daily as needed for cough (Do NOT drive if taking during the day).  Dispense: 120 mL; Refill: 0   Subjective:    Outpatient Medications Prior to Visit  Medication Sig Dispense Refill   levothyroxine (SYNTHROID) 75 MCG tablet Take 1 tablet (75 mcg total) by mouth daily. 90 tablet 3   pantoprazole (PROTONIX) 20 MG tablet Take 1 tablet (20 mg total) by mouth daily. 90 tablet 3   rosuvastatin (CRESTOR) 20 MG tablet Take 1 tablet (20 mg total) by mouth daily. 90 tablet 3   No facility-administered medications prior to visit.   Past Medical History:  Diagnosis Date   Hyperlipidemia    Hypothyroidism    Past Surgical History:  Procedure Laterality Date   CESAREAN SECTION     ORIF ELBOW FRACTURE Right 02/15/2016   Procedure: OPEN REDUCTION INTERNAL FIXATION (ORIF) ELBOW/OLECRANON FRACTURE;  Surgeon: Iran Planas, MD;  Location: [place];  Service: Orthopedics;  Laterality: Right;   No Known Allergies    Objective:    Physical Exam Vitals and nursing note reviewed.  Constitutional:      Appearance: Normal appearance. She is ill-appearing.     Interventions: Face mask in place.  HENT:     Right Ear: Tympanic membrane and ear canal normal.     Left Ear: Tympanic membrane and ear canal normal.     Nose:     Right Sinus: Frontal sinus tenderness present.     Left Sinus: Frontal sinus tenderness present.     Mouth/Throat:     Mouth: Mucous membranes are moist.     Pharynx: Posterior oropharyngeal erythema present. No pharyngeal swelling, oropharyngeal exudate or uvula swelling.     Tonsils: No tonsillar exudate or tonsillar abscesses.  Cardiovascular:     Rate and Rhythm: Normal rate and regular rhythm.  Pulmonary:     Effort: Pulmonary effort is normal.     Breath sounds: Normal breath sounds.  Musculoskeletal:        General: Normal range of motion.  Lymphadenopathy:     Head:     Right side of head: No preauricular or posterior auricular adenopathy.     Left side of head: No preauricular or posterior auricular adenopathy.     Cervical: No cervical adenopathy.  Skin:    General: Skin  is warm and dry.  Neurological:     Mental Status: She is alert.  Psychiatric:        Mood and Affect: Mood normal.        Behavior: Behavior normal.    BP 103/72 (BP Location: Left Arm, Patient Position: Sitting, Cuff Size: Large)   Pulse 88   Temp 98 F (36.7 C) (Temporal)   Ht '5\' 5"'$  (1.651 m)   Wt 163 lb 4 oz (74 kg)   LMP 04/25/2016   SpO2 96%   BMI 27.17 kg/m  Wt Readings from Last 3 Encounters:  03/03/22 163 lb 4 oz (74 kg)  01/26/22 172 lb 3.2 oz (78.1 kg)  12/10/21 179 lb (81.2 kg)       Jeanie Sewer, NP

## 2022-03-30 ENCOUNTER — Ambulatory Visit (HOSPITAL_BASED_OUTPATIENT_CLINIC_OR_DEPARTMENT_OTHER): Payer: BC Managed Care – PPO | Admitting: Internal Medicine

## 2022-04-30 ENCOUNTER — Encounter: Payer: Self-pay | Admitting: Physician Assistant

## 2022-04-30 ENCOUNTER — Ambulatory Visit: Payer: BC Managed Care – PPO | Admitting: Physician Assistant

## 2022-04-30 VITALS — BP 111/71 | HR 72 | Temp 96.4°F | Ht 65.0 in | Wt 163.2 lb

## 2022-04-30 DIAGNOSIS — E782 Mixed hyperlipidemia: Secondary | ICD-10-CM

## 2022-04-30 DIAGNOSIS — R7303 Prediabetes: Secondary | ICD-10-CM

## 2022-04-30 LAB — LIPID PANEL
Cholesterol: 266 mg/dL — ABNORMAL HIGH (ref 0–200)
HDL: 76.8 mg/dL (ref >39.00)
LDL Cholesterol: 175 mg/dL — ABNORMAL HIGH (ref 0–99)
NonHDL: 189.46
Total CHOL/HDL Ratio: 3
Triglycerides: 74 mg/dL (ref 0.0–149.0)
VLDL: 14.8 mg/dL (ref 0.0–40.0)

## 2022-04-30 LAB — HEMOGLOBIN A1C: Hgb A1c MFr Bld: 5.9 % (ref 4.6–6.5)

## 2022-04-30 LAB — BASIC METABOLIC PANEL
BUN: 19 mg/dL (ref 6–23)
CO2: 28 mEq/L (ref 19–32)
Calcium: 9.9 mg/dL (ref 8.4–10.5)
Chloride: 104 mEq/L (ref 96–112)
Creatinine, Ser: 1 mg/dL (ref 0.40–1.20)
GFR: 63.36 mL/min (ref >60.00)
Glucose, Bld: 112 mg/dL — ABNORMAL HIGH (ref 70–99)
Potassium: 3.9 mEq/L (ref 3.5–5.1)
Sodium: 141 mEq/L (ref 135–145)

## 2022-04-30 NOTE — Progress Notes (Unsigned)
Subjective:    Patient ID: Brenda Frost, female    DOB: 24-Dec-1965, 57 y.o.   MRN: [id number]  Chief Complaint  Patient presents with   skin lesions     Pt not receiving 2nd shingrix due to 1st dose reaction     HPI Patient is in today for follow-up. She was scheduled to receive 2nd Shingrix vaccine, but declines due to poor reaction to the first. Also would like labs checked. 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   Past Medical History:  Diagnosis Date   Hyperlipidemia    Hypothyroidism     Past Surgical History:  Procedure Laterality Date   CESAREAN SECTION     ORIF ELBOW FRACTURE Right 02/15/2016   Procedure: OPEN REDUCTION INTERNAL FIXATION (ORIF) ELBOW/OLECRANON FRACTURE;  Surgeon: Iran Planas, MD;  Location: [place];  Service: Orthopedics;  Laterality: Right;    Family History  Problem Relation Age of Onset   Diabetes Mother    Hyperlipidemia Mother    Hypertension Mother    Kidney disease Mother    Heart disease Maternal Grandmother    CAD Neg Hx    Breast cancer Neg Hx    Colon cancer Neg Hx    Esophageal cancer Neg Hx    Inflammatory bowel disease Neg Hx    Liver disease Neg Hx    Pancreatic cancer Neg Hx    Rectal cancer Neg Hx    Stomach cancer Neg Hx     Social History   Tobacco Use   Smoking status: Never   Smokeless tobacco: Never  Vaping Use   Vaping Use: Never used  Substance Use Topics   Alcohol use: Not Currently    Alcohol/week: 1.0 standard drink of alcohol    Types: 1 Cans of beer per week   Drug use: No     No Known Allergies  Review of Systems NEGATIVE UNLESS OTHERWISE INDICATED IN HPI      Objective:     BP 111/71   Pulse 72   Temp (!) 96.4 F (35.8 C) (Temporal)   Ht 5' 5"$  (1.651 m)   Wt 163 lb 3.2 oz (74 kg)   LMP 04/25/2016   SpO2 99%   BMI 27.16 kg/m   Wt Readings from Last 3 Encounters:  04/30/22 163 lb 3.2 oz (74 kg)  03/03/22 163 lb 4 oz (74 kg)  01/26/22  172 lb 3.2 oz (78.1 kg)    BP Readings from Last 3 Encounters:  04/30/22 111/71  03/03/22 103/72  01/26/22 102/76     Physical Exam Vitals and nursing note reviewed.  Constitutional:      Appearance: Normal appearance.  Cardiovascular:     Rate and Rhythm: Normal rate and regular rhythm.     Pulses: Normal pulses.     Heart sounds: No murmur heard. Pulmonary:     Effort: Pulmonary effort is normal.     Breath sounds: Normal breath sounds.  Neurological:     General: No focal deficit present.     Mental Status: She is alert and oriented to person, place, and time.  Psychiatric:        Mood and Affect: Mood normal.        Behavior: Behavior normal.        Assessment & Plan:  Prediabetes Assessment & Plan: Recheck Ha1c today. E2M Lifestyle changes have been great.   Orders: -     Basic metabolic  panel -     Hemoglobin A1c  Mixed hyperlipidemia Assessment & Plan: The 10-year ASCVD risk score (Arnett DK, et al., 2019) is: 1.5%   Values used to calculate the score:     Age: 63 years     Sex: Female     Is Non-Hispanic African American: No     Diabetic: No     Tobacco smoker: No     Systolic Blood Pressure: 99991111 mmHg     Is BP treated: No     HDL Cholesterol: 76.8 mg/dL     Total Cholesterol: 266 mg/dL  Patient is currently on Crestor 20 mg daily. She follows with lipid clinic.  She is doing great on lifestyle changes.   Orders: -     Lipid panel      Return in about 6 months (around 10/29/2022) for CPE, fasting labs .     Irma Roulhac M Keatyn Jawad, PA-C

## 2022-05-02 NOTE — Assessment & Plan Note (Signed)
Recheck Ha1c today. E2M Lifestyle changes have been great.

## 2022-05-02 NOTE — Assessment & Plan Note (Signed)
The 10-year ASCVD risk score (Arnett DK, et al., 2019) is: 1.5%   Values used to calculate the score:     Age: 57 years     Sex: Female     Is Non-Hispanic African American: No     Diabetic: No     Tobacco smoker: No     Systolic Blood Pressure: 99991111 mmHg     Is BP treated: No     HDL Cholesterol: 76.8 mg/dL     Total Cholesterol: 266 mg/dL  Patient is currently on Crestor 20 mg daily. She follows with lipid clinic.  She is doing great on lifestyle changes.

## 2022-05-05 ENCOUNTER — Other Ambulatory Visit: Payer: Self-pay

## 2022-05-05 ENCOUNTER — Other Ambulatory Visit (HOSPITAL_COMMUNITY): Payer: Self-pay

## 2022-05-31 ENCOUNTER — Other Ambulatory Visit (HOSPITAL_COMMUNITY): Payer: Self-pay

## 2022-06-04 DIAGNOSIS — E7800 Pure hypercholesterolemia, unspecified: Secondary | ICD-10-CM | POA: Diagnosis not present

## 2022-06-04 DIAGNOSIS — E7841 Elevated Lipoprotein(a): Secondary | ICD-10-CM | POA: Diagnosis not present

## 2022-06-07 ENCOUNTER — Ambulatory Visit: Payer: BC Managed Care – PPO | Attending: Internal Medicine | Admitting: Internal Medicine

## 2022-06-07 ENCOUNTER — Encounter: Payer: Self-pay | Admitting: Internal Medicine

## 2022-06-07 VITALS — BP 132/82 | HR 68 | Ht 66.0 in | Wt 162.6 lb

## 2022-06-07 DIAGNOSIS — E7849 Other hyperlipidemia: Secondary | ICD-10-CM | POA: Diagnosis not present

## 2022-06-07 DIAGNOSIS — E7841 Elevated Lipoprotein(a): Secondary | ICD-10-CM

## 2022-06-07 DIAGNOSIS — R072 Precordial pain: Secondary | ICD-10-CM

## 2022-06-07 NOTE — Progress Notes (Signed)
LIPID CLINIC CONSULT NOTE  Chief Complaint:  Follow-up dyslipidemia, chest pain  Primary Care Physician: Allwardt, Randa Evens, PA-C  Primary Cardiologist:  None  HPI:  Brenda Frost is a 57 y.o. female who is being seen today for the evaluation of dyslipidemia at the request of Allwardt, Alyssa M, PA-C. 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 She is of [place] ancestry and has been referred for evaluation of marked dyslipidemia. More recently her lipids have demonstrated significant elevation in total cholesterol at 307, triglycerides 67, HDL 90 and LDL of 203. She reports that this is actually been her lipid profile since her teenage years when she first had her cholesterol tested. She has no history of cardiovascular events and it sounds like no significant family history of coronary disease. She is not aware of either of her parents having abnormal cholesterol although these numbers are suggestive of familial hyperlipidemia. She has not been previously on any therapy but was prescribed rosuvastatin 10 mg daily. She has not started the medication yet pending this visit. Currently she is asymptomatic, denying chest pain or worsening shortness of breath. She has some issues with pancreatitis and/or abdominal discomfort which she seems to be managing with diet.  01/26/2022  Brenda Frost returns today for follow-up of dyslipidemia.  I last saw her back in 2021.  At that time we had discussed increasing her statin medication up to 20 mg due to her elevated cholesterol.  Subsequently she has discontinued taking the statin.  She said she was briefly on a keto diet with her husband but noted that her cholesterol had gone up significantly.  Her labs as of August showed total cholesterol 349, triglycerides 72, HDL 81 and LDL 253.  She did have a calcium score that was 0 in 2021.  We discussed this in some detail today including the fact that this may  not necessarily be associated with low cardiovascular risk in patients with familial hyperlipidemia and that treatment with greater than 50% reduction in her lipids is recommended.  06/07/2022  Brenda Frost is seen today in follow-up.  She seems to be tolerating rosuvastatin well.  There has been a significant decrease in her lipids with total cholesterol now 266, down from 349, LDL cholesterol is 175 down from 253.  Her LP(a) as previously mentioned is significantly elevated at 226.4 nmol/L.  Although she has had substantial improvement in her lipids, her cholesterol remains quite high and her LP(a) is not affected by the statin.  In addition to discussing her lipids today, she tells me that she has had 2 episodes of chest discomfort.  She thought these might be indigestion although involve chest pressure that radiated up to the jaw and into her teeth.  1 episode was with exercise that improved about 10 to 20 minutes after she stopped exercising.  Of note she had no coronary calcium in 2021.  PMHx:  Past Medical History:  Diagnosis Date   Hyperlipidemia    Hypothyroidism     Past Surgical History:  Procedure Laterality Date   CESAREAN SECTION     ORIF ELBOW FRACTURE Right 02/15/2016   Procedure: OPEN REDUCTION INTERNAL FIXATION (ORIF) ELBOW/OLECRANON FRACTURE;  Surgeon: Iran Planas, MD;  Location: [place];  Service: Orthopedics;  Laterality: Right;    FAMHx:  Family History  Problem Relation Age of Onset   Diabetes Mother    Hyperlipidemia Mother    Hypertension Mother  Kidney disease Mother    Heart disease Maternal Grandmother    CAD Neg Hx    Breast cancer Neg Hx    Colon cancer Neg Hx    Esophageal cancer Neg Hx    Inflammatory bowel disease Neg Hx    Liver disease Neg Hx    Pancreatic cancer Neg Hx    Rectal cancer Neg Hx    Stomach cancer Neg Hx     SOCHx:   reports that she has never smoked. She has never used smokeless tobacco. She reports that she does not currently  use alcohol after a past usage of about 1.0 standard drink of alcohol per week. She reports that she does not use drugs.  ALLERGIES:  No Known Allergies  ROS: Pertinent items noted in HPI and remainder of comprehensive ROS otherwise negative.  HOME MEDS: Current Outpatient Medications on File Prior to Visit  Medication Sig Dispense Refill   levothyroxine (SYNTHROID) 75 MCG tablet Take 1 tablet (75 mcg total) by mouth daily. 90 tablet 3   pantoprazole (PROTONIX) 20 MG tablet Take 1 tablet (20 mg total) by mouth daily. 90 tablet 3   rosuvastatin (CRESTOR) 20 MG tablet Take 1 tablet (20 mg total) by mouth daily. 90 tablet 3   No current facility-administered medications on file prior to visit.    LABS/IMAGING: No results found for this or any previous visit (from the past 48 hour(s)). No results found.  LIPID PANEL:    Component Value Date/Time   CHOL 266 (H) 04/30/2022 0837   CHOL 293 (H) 12/08/2017 0820   TRIG 74.0 04/30/2022 0837   HDL 76.80 04/30/2022 0837   HDL 86 12/08/2017 0820   CHOLHDL 3 04/30/2022 0837   VLDL 14.8 04/30/2022 0837   LDLCALC 175 (H) 04/30/2022 0837   LDLCALC 192 (H) 12/08/2017 0820    WEIGHTS: Wt Readings from Last 3 Encounters:  06/07/22 166 lb (75.3 kg)  04/30/22 163 lb 3.2 oz (74 kg)  03/03/22 163 lb 4 oz (74 kg)    VITALS: BP 132/82   Pulse 68   Ht 5\' 6"  (1.676 m)   Wt 166 lb (75.3 kg)   LMP 04/25/2016   SpO2 99%   BMI 26.79 kg/m   EXAM: Deferred  EKG: Deferred  ASSESSMENT: Chest pain, possible angina Probable familial hyperlipidemia -several genetic variants including APO E3/E4, APOA5, LMF1 and CETP variations. Elevated LP(a)-226.4 nmol/L 0 CAC score (2021)  PLAN: 1.   Brenda Frost has had improvement in her lipids however is still well above target on statin therapy alone.  She has a high LP(a) and her LDL cholesterol is still quite high.  She would benefit from additional therapy with a PCSK9 inhibitor.  She is somewhat  hesitant to start this at this time but would like to consider it further.  Options include the antibody PCSK9 inhibitor such as Repatha and Praluent or possibly Leqvio which is also more expensive.  I am concerned about 2 episodes of chest pain that she has had.  They do somewhat sound like angina.  Although she had no coronary calcium in 2021 given her high cholesterol and younger age she could have noncalcified coronary disease.  I offered coronary CT angiography to further evaluate this but she wanted to wait and consider that further as well.  She did get labs a few days ago but they have not yet resulted.  I will contact her with those results and we can consider options going forward.  Pixie Casino, MD, [facility], [place] Director of the Advanced Lipid Disorders &  Cardiovascular Risk Reduction Clinic Diplomate of the American Board of Clinical Lipidology Attending Cardiologist  Direct Dial: [phone]  Fax: [phone]  Website:  [email or web address] 06/07/2022, 4:41 PM

## 2022-06-07 NOTE — Patient Instructions (Signed)
Medication Instructions:  Repatha - once every 14 days Praluent - once every 14 days Leqvio - depends on medical benefits/deductible  *If you need a refill on your cardiac medications before your next appointment, please call your pharmacy*   Lab Work: FASTING lab work to check cholesterol before next appointment -- will depend on med changes  If you have labs (blood work) drawn today and your tests are completely normal, you will receive your results only by: [place] (if you have MyChart) OR A paper copy in the mail If you have any lab test that is abnormal or we need to change your treatment, we will call you to review the results.   Follow-Up: At [facility], you and your health needs are our priority.  As part of our continuing mission to provide you with exceptional heart care, we have created designated Provider Care Teams.  These Care Teams include your primary Cardiologist (physician) and Advanced Practice Providers (APPs -  Physician Assistants and Nurse Practitioners) who all work together to provide you with the care you need, when you need it.  We recommend signing up for the patient portal called "MyChart".  Sign up information is provided on this After Visit Summary.  MyChart is used to connect with patients for Virtual Visits (Telemedicine).  Patients are able to view lab/test results, encounter notes, upcoming appointments, etc.  Non-urgent messages can be sent to your provider as well.   To learn more about what you can do with MyChart, go to [email or web address].    Your next appointment:    Either 3-4 months -- if Repatha or Praluent OR 5-6 months --- if [facility]

## 2022-06-08 LAB — NMR, LIPOPROFILE
Cholesterol, Total: 299 mg/dL — ABNORMAL HIGH (ref 100–199)
HDL Particle Number: 30.6 umol/L (ref >=30.5)
HDL-C: 96 mg/dL (ref >39)
LDL Particle Number: 1987 nmol/L — ABNORMAL HIGH (ref <1000)
LDL Size: 21.9 nm (ref >20.5)
LDL-C (NIH Calc): 194 mg/dL — ABNORMAL HIGH (ref 0–99)
LP-IR Score: <25 (ref <=45)
Small LDL Particle Number: <90 nmol/L (ref <=527)
Triglycerides: 64 mg/dL (ref 0–149)

## 2022-06-11 ENCOUNTER — Encounter: Payer: Self-pay | Admitting: Internal Medicine

## 2022-06-11 DIAGNOSIS — E7841 Elevated Lipoprotein(a): Secondary | ICD-10-CM

## 2022-06-11 DIAGNOSIS — Z01812 Encounter for preprocedural laboratory examination: Secondary | ICD-10-CM

## 2022-06-11 DIAGNOSIS — E7849 Other hyperlipidemia: Secondary | ICD-10-CM

## 2022-06-11 DIAGNOSIS — R072 Precordial pain: Secondary | ICD-10-CM

## 2022-06-14 ENCOUNTER — Other Ambulatory Visit (HOSPITAL_COMMUNITY): Payer: Self-pay

## 2022-06-14 MED ORDER — METOPROLOL TARTRATE 50 MG PO TABS
50.0000 mg | ORAL_TABLET | ORAL | 0 refills | Status: DC
  Filled 2022-06-14: qty 1, 1d supply, fill #0

## 2022-06-15 ENCOUNTER — Other Ambulatory Visit (HOSPITAL_COMMUNITY): Payer: Self-pay

## 2022-06-16 ENCOUNTER — Other Ambulatory Visit (HOSPITAL_COMMUNITY): Payer: Self-pay

## 2022-06-17 ENCOUNTER — Telehealth (HOSPITAL_COMMUNITY): Payer: Self-pay | Admitting: Emergency Medicine

## 2022-06-17 NOTE — Telephone Encounter (Signed)
Attempted to call patient regarding upcoming cardiac CT appointment. °Left message on voicemail with name and callback number °Merelyn Klump RN Navigator Cardiac Imaging °[facility] °[phone] Office °[phone] Cell ° °

## 2022-06-18 ENCOUNTER — Other Ambulatory Visit (HOSPITAL_BASED_OUTPATIENT_CLINIC_OR_DEPARTMENT_OTHER): Payer: Self-pay | Admitting: Internal Medicine

## 2022-06-18 ENCOUNTER — Ambulatory Visit (HOSPITAL_COMMUNITY)
Admission: RE | Admit: 2022-06-18 | Discharge: 2022-06-18 | Disposition: A | Discharge status: Home / Self Care | Payer: BC Managed Care – PPO | Source: Ambulatory Visit | Attending: Internal Medicine | Admitting: Internal Medicine

## 2022-06-18 ENCOUNTER — Telehealth: Payer: Self-pay | Admitting: Diagnostic Radiology

## 2022-06-18 DIAGNOSIS — E7841 Elevated Lipoprotein(a): Secondary | ICD-10-CM | POA: Insufficient documentation

## 2022-06-18 DIAGNOSIS — E7849 Other hyperlipidemia: Secondary | ICD-10-CM | POA: Diagnosis not present

## 2022-06-18 DIAGNOSIS — R072 Precordial pain: Secondary | ICD-10-CM

## 2022-06-18 LAB — BASIC METABOLIC PANEL
BUN/Creatinine Ratio: 19 (ref 9–23)
BUN: 16 mg/dL (ref 6–24)
CO2: 29 mmol/L (ref 20–29)
Calcium: 10 mg/dL (ref 8.7–10.2)
Chloride: 105 mmol/L (ref 96–106)
Creatinine, Ser: 0.86 mg/dL (ref 0.57–1.00)
Glucose: 117 mg/dL — ABNORMAL HIGH (ref 70–99)
Potassium: 4.5 mmol/L (ref 3.5–5.2)
Sodium: 142 mmol/L (ref 134–144)
eGFR: 80 mL/min/{1.73_m2} (ref >59)

## 2022-06-18 MED ORDER — IOHEXOL 350 MG/ML SOLN
100.0000 mL | Freq: Once | INTRAVENOUS | Status: AC | PRN
  Administered 2022-06-18: 100 mL via INTRAVENOUS

## 2022-06-18 MED ORDER — NITROGLYCERIN 0.4 MG SL SUBL
SUBLINGUAL_TABLET | SUBLINGUAL | Status: AC
  Filled 2022-06-18: qty 2

## 2022-06-18 MED ORDER — NITROGLYCERIN 0.4 MG SL SUBL
0.8000 mg | SUBLINGUAL_TABLET | Freq: Once | SUBLINGUAL | Status: AC
  Administered 2022-06-18: 0.8 mg via SUBLINGUAL

## 2022-06-18 NOTE — Progress Notes (Signed)
  Evaluation after Contrast Extravasation  Patient seen and examined immediately after contrast extravasation while in CT3. IV removed from right AC and coban applied prior to my arrival. Patient sitting up, reports arm feels tight at area of extravasation but denies any other complaints including numbness, tingling or weakness. Per CT tech significant contrast seen on CT images so likely only minimal contrast with saline flush in SQ.   Exam: There is mild swelling at the area just above the right AC.  There is no erythema. There is no discoloration. There are no blisters. There are no signs of decreased perfusion of the skin.  It is warm to touch.  The patient has full ROM in fingers.  Radial pulse palpable.  Per contrast extravasation protocol, I have instructed the patient to keep the right arm elevated whenever possible and place an ice pack on the area for 20-60 minutes at a time every 2 hours while awake for 48 hours.   The patient understands to call the radiology department if there is: - increase in pain or swelling - changed or altered sensation - ulceration or blistering - increasing redness - warmth or increasing firmness - decreased tissue perfusion as noted by decreased capillary refill or discoloration of skin - decreased pulses peripheral to site   [employer] PA-C 06/18/2022 1:17 PM

## 2022-06-18 NOTE — Telephone Encounter (Signed)
Received a call from [facility] calling to report Bmet results normal except glucose 117.Results will be posted in patient's chart.

## 2022-06-21 ENCOUNTER — Other Ambulatory Visit (HOSPITAL_COMMUNITY): Payer: Self-pay

## 2022-06-21 ENCOUNTER — Encounter: Payer: Self-pay | Admitting: Internal Medicine

## 2022-06-21 MED ORDER — REPATHA SURECLICK 140 MG/ML ~~LOC~~ SOAJ
1.0000 mL | SUBCUTANEOUS | 3 refills | Status: DC
  Filled 2022-06-21: qty 6, 84d supply, fill #0
  Filled 2022-11-15: qty 6, 84d supply, fill #1
  Filled 2023-02-12: qty 6, 84d supply, fill #2
  Filled 2023-02-23: qty 6, 84d supply, fill #0
  Filled 2023-02-23: qty 6, 84d supply, fill #2
  Filled 2023-04-27: qty 6, 84d supply, fill #1

## 2022-08-13 ENCOUNTER — Other Ambulatory Visit: Payer: Self-pay | Admitting: Oncology

## 2022-08-13 DIAGNOSIS — Z1379 Encounter for other screening for genetic and chromosomal anomalies: Secondary | ICD-10-CM

## 2022-08-23 DIAGNOSIS — Z124 Encounter for screening for malignant neoplasm of cervix: Secondary | ICD-10-CM | POA: Diagnosis not present

## 2022-08-23 DIAGNOSIS — Z1151 Encounter for screening for human papillomavirus (HPV): Secondary | ICD-10-CM | POA: Diagnosis not present

## 2022-08-23 DIAGNOSIS — Z01419 Encounter for gynecological examination (general) (routine) without abnormal findings: Secondary | ICD-10-CM | POA: Diagnosis not present

## 2022-08-23 DIAGNOSIS — Z6826 Body mass index (BMI) 26.0-26.9, adult: Secondary | ICD-10-CM | POA: Diagnosis not present

## 2022-08-23 DIAGNOSIS — Z1231 Encounter for screening mammogram for malignant neoplasm of breast: Secondary | ICD-10-CM | POA: Diagnosis not present

## 2022-09-13 ENCOUNTER — Other Ambulatory Visit (HOSPITAL_COMMUNITY): Payer: Self-pay

## 2022-09-16 ENCOUNTER — Other Ambulatory Visit (HOSPITAL_COMMUNITY): Payer: Self-pay

## 2022-10-25 ENCOUNTER — Encounter: Payer: Self-pay | Admitting: Physician Assistant

## 2022-10-25 ENCOUNTER — Ambulatory Visit (INDEPENDENT_AMBULATORY_CARE_PROVIDER_SITE_OTHER): Payer: BC Managed Care – PPO | Admitting: Physician Assistant

## 2022-10-25 VITALS — BP 108/64 | HR 68 | Temp 98.0°F | Ht 66.0 in | Wt 162.8 lb

## 2022-10-25 DIAGNOSIS — Z0000 Encounter for general adult medical examination without abnormal findings: Secondary | ICD-10-CM | POA: Diagnosis not present

## 2022-10-25 DIAGNOSIS — R7303 Prediabetes: Secondary | ICD-10-CM | POA: Diagnosis not present

## 2022-10-25 DIAGNOSIS — R232 Flushing: Secondary | ICD-10-CM

## 2022-10-25 DIAGNOSIS — E782 Mixed hyperlipidemia: Secondary | ICD-10-CM | POA: Diagnosis not present

## 2022-10-25 DIAGNOSIS — E038 Other specified hypothyroidism: Secondary | ICD-10-CM

## 2022-10-25 LAB — CBC WITH DIFFERENTIAL/PLATELET
Basophils Absolute: 0 10*3/uL (ref 0.0–0.1)
Basophils Relative: 0.4 % (ref 0.0–3.0)
Eosinophils Absolute: 0.1 10*3/uL (ref 0.0–0.7)
Eosinophils Relative: 2 % (ref 0.0–5.0)
HCT: 42.9 % (ref 36.0–46.0)
Hemoglobin: 13.9 g/dL (ref 12.0–15.0)
Lymphocytes Relative: 30.4 % (ref 12.0–46.0)
Lymphs Abs: 1.3 10*3/uL (ref 0.7–4.0)
MCHC: 32.3 g/dL (ref 30.0–36.0)
MCV: 91.2 fl (ref 78.0–100.0)
Monocytes Absolute: 0.4 10*3/uL (ref 0.1–1.0)
Monocytes Relative: 8.7 % (ref 3.0–12.0)
Neutro Abs: 2.5 10*3/uL (ref 1.4–7.7)
Neutrophils Relative %: 58.5 % (ref 43.0–77.0)
Platelets: 155 10*3/uL (ref 150.0–400.0)
RBC: 4.7 Mil/uL (ref 3.87–5.11)
RDW: 13.4 % (ref 11.5–15.5)
WBC: 4.3 10*3/uL (ref 4.0–10.5)

## 2022-10-25 LAB — COMPREHENSIVE METABOLIC PANEL
ALT: 14 U/L (ref 0–35)
AST: 16 U/L (ref 0–37)
Albumin: 4 g/dL (ref 3.5–5.2)
Alkaline Phosphatase: 88 U/L (ref 39–117)
BUN: 15 mg/dL (ref 6–23)
CO2: 29 mEq/L (ref 19–32)
Calcium: 10 mg/dL (ref 8.4–10.5)
Chloride: 107 mEq/L (ref 96–112)
Creatinine, Ser: 0.89 mg/dL (ref 0.40–1.20)
GFR: 72.62 mL/min (ref >60.00)
Glucose, Bld: 111 mg/dL — ABNORMAL HIGH (ref 70–99)
Potassium: 4.8 mEq/L (ref 3.5–5.1)
Sodium: 142 mEq/L (ref 135–145)
Total Bilirubin: 0.6 mg/dL (ref 0.2–1.2)
Total Protein: 6.8 g/dL (ref 6.0–8.3)

## 2022-10-25 LAB — LIPID PANEL
Cholesterol: 170 mg/dL (ref 0–200)
HDL: 86.1 mg/dL (ref >39.00)
LDL Cholesterol: 72 mg/dL (ref 0–99)
NonHDL: 83.54
Total CHOL/HDL Ratio: 2
Triglycerides: 58 mg/dL (ref 0.0–149.0)
VLDL: 11.6 mg/dL (ref 0.0–40.0)

## 2022-10-25 LAB — T4, FREE: Free T4: 1.15 ng/dL (ref 0.60–1.60)

## 2022-10-25 LAB — HEMOGLOBIN A1C: Hgb A1c MFr Bld: 5.8 % (ref 4.6–6.5)

## 2022-10-25 LAB — TSH: TSH: 1.54 u[IU]/mL (ref 0.35–5.50)

## 2022-10-25 NOTE — Progress Notes (Signed)
Subjective:    Patient ID: Brenda Frost, female    DOB: March 20, 1966, 57 y.o.   MRN: [id number]  Chief Complaint  Patient presents with   Annual Exam    HPI Patient is in today for annual exam.   Acute concerns: Hot flashes - waking up drenched most nights, started about the same time as Repatha   Health maintenance: Lifestyle/ exercise: daily  Nutrition: clean-eating, following her plan Eager to Motivate Mental health: doing good  Sleep: flushing, otherwise doing well  Substance use: none  Sexual activity: monogamous Immunizations: UTD Colonoscopy: UTD - Cologuard - no issues  Pap: UTD Mammogram: UTD   Past Medical History:  Diagnosis Date   Hyperlipidemia    Hypothyroidism     Past Surgical History:  Procedure Laterality Date   CESAREAN SECTION     ORIF ELBOW FRACTURE Right 02/15/2016   Procedure: OPEN REDUCTION INTERNAL FIXATION (ORIF) ELBOW/OLECRANON FRACTURE;  Surgeon: Bradly Bienenstock, MD;  Location: [facility] OR;  Service: Orthopedics;  Laterality: Right;    Family History  Problem Relation Age of Onset   Diabetes Mother    Hyperlipidemia Mother    Hypertension Mother    Kidney disease Mother    Heart disease Maternal Grandmother    CAD Neg Hx    Breast cancer Neg Hx    Colon cancer Neg Hx    Esophageal cancer Neg Hx    Inflammatory bowel disease Neg Hx    Liver disease Neg Hx    Pancreatic cancer Neg Hx    Rectal cancer Neg Hx    Stomach cancer Neg Hx     Social History   Tobacco Use   Smoking status: Never   Smokeless tobacco: Never  Vaping Use   Vaping status: Never Used  Substance Use Topics   Alcohol use: Not Currently    Alcohol/week: 1.0 standard drink of alcohol    Types: 1 Cans of beer per week   Drug use: No     No Known Allergies  Review of Systems NEGATIVE UNLESS OTHERWISE INDICATED IN HPI      Objective:     BP 108/64   Pulse 68   Temp 98 F (36.7 C) (Temporal)   Ht 5\' 6"  (1.676 m)   Wt 162 lb 12.8 oz (73.8 kg)   LMP  04/25/2016   SpO2 100%   BMI 26.28 kg/m   Wt Readings from Last 3 Encounters:  10/25/22 162 lb 12.8 oz (73.8 kg)  06/07/22 162 lb 9.6 oz (73.8 kg)  04/30/22 163 lb 3.2 oz (74 kg)    BP Readings from Last 3 Encounters:  10/25/22 108/64  06/18/22 122/73  06/07/22 132/82     Physical Exam Vitals and nursing note reviewed.  Constitutional:      Appearance: Normal appearance. She is normal weight. She is not toxic-appearing.  HENT:     Head: Normocephalic and atraumatic.     Right Ear: Tympanic membrane, ear canal and external ear normal.     Left Ear: Tympanic membrane, ear canal and external ear normal.     Nose: Nose normal.     Mouth/Throat:     Mouth: Mucous membranes are moist.  Eyes:     Extraocular Movements: Extraocular movements intact.     Conjunctiva/sclera: Conjunctivae normal.     Pupils: Pupils are equal, round, and reactive to light.  Cardiovascular:     Rate and Rhythm: Normal rate and regular rhythm.     Pulses: Normal pulses.  Heart sounds: Normal heart sounds.  Pulmonary:     Effort: Pulmonary effort is normal.     Breath sounds: Normal breath sounds.  Abdominal:     General: Abdomen is flat. Bowel sounds are normal.     Palpations: Abdomen is soft.  Musculoskeletal:        General: Normal range of motion.     Cervical back: Normal range of motion and neck supple.  Skin:    General: Skin is warm and dry.  Neurological:     General: No focal deficit present.     Mental Status: She is alert and oriented to person, place, and time.  Psychiatric:        Mood and Affect: Mood normal.        Behavior: Behavior normal.        Thought Content: Thought content normal.        Judgment: Judgment normal.        Assessment & Plan:  Encounter for annual physical exam -     Lipid panel -     Comprehensive metabolic panel -     CBC with Differential/Platelet -     Hemoglobin A1c -     TSH -     T4, free  Prediabetes -     Comprehensive metabolic  panel -     Hemoglobin A1c  Mixed hyperlipidemia -     Lipid panel  Other specified hypothyroidism -     TSH -     T4, free  Flushing    Age-appropriate screening and counseling performed today. Will check labs and call with results. Preventive measures discussed and printed in AVS for patient.   Patient Counseling: [x]   Nutrition: Stressed importance of moderation in sodium/caffeine intake, saturated fat and cholesterol, caloric balance, sufficient intake of fresh fruits, vegetables, and fiber.  [x]   Stressed the importance of regular exercise.   []   Substance Abuse: Discussed cessation/primary prevention of tobacco, alcohol, or other drug use; driving or other dangerous activities under the influence; availability of treatment for abuse.   []   Injury prevention: Discussed safety belts, safety helmets, smoke detector, smoking near bedding or upholstery.   []   Sexuality: Discussed sexually transmitted diseases, partner selection, use of condoms, avoidance of unintended pregnancy  and contraceptive alternatives.   [x]   Dental health: Discussed importance of regular tooth brushing, flossing, and dental visits.  [x]   Health maintenance and immunizations reviewed. Please refer to Health maintenance section.     Flushing - previously had this postmenopausal, then resolved, now back again. Will check labs today, treat pending. ?Repatha - ask Dr. Rennis Golden. If still no findings for cause, check in with GYN.    Return in about 1 year (around 10/25/2023) for physical.    M , PA-C

## 2022-10-29 ENCOUNTER — Encounter: Payer: Self-pay | Admitting: Internal Medicine

## 2022-11-16 ENCOUNTER — Other Ambulatory Visit (HOSPITAL_COMMUNITY): Payer: Self-pay

## 2022-12-24 DIAGNOSIS — M25561 Pain in right knee: Secondary | ICD-10-CM | POA: Insufficient documentation

## 2022-12-31 ENCOUNTER — Other Ambulatory Visit: Payer: Self-pay

## 2022-12-31 ENCOUNTER — Other Ambulatory Visit: Payer: Self-pay | Admitting: Physician Assistant

## 2023-01-03 ENCOUNTER — Other Ambulatory Visit (HOSPITAL_COMMUNITY): Payer: Self-pay

## 2023-01-03 MED ORDER — LEVOTHYROXINE SODIUM 75 MCG PO TABS
75.0000 ug | ORAL_TABLET | Freq: Every day | ORAL | 3 refills | Status: DC
  Filled 2023-01-03 – 2023-01-17 (×2): qty 90, 90d supply, fill #0
  Filled 2023-02-12: qty 90, 90d supply, fill #1
  Filled 2023-08-29: qty 90, 90d supply, fill #2

## 2023-01-06 DIAGNOSIS — M79604 Pain in right leg: Secondary | ICD-10-CM | POA: Diagnosis not present

## 2023-01-13 ENCOUNTER — Other Ambulatory Visit (HOSPITAL_COMMUNITY): Payer: Self-pay

## 2023-01-14 ENCOUNTER — Ambulatory Visit: Payer: BC Managed Care – PPO | Attending: Internal Medicine | Admitting: Internal Medicine

## 2023-01-14 ENCOUNTER — Encounter: Payer: Self-pay | Admitting: Internal Medicine

## 2023-01-14 VITALS — BP 120/71 | HR 59 | Ht 66.0 in | Wt 167.8 lb

## 2023-01-14 DIAGNOSIS — E7841 Elevated Lipoprotein(a): Secondary | ICD-10-CM

## 2023-01-14 DIAGNOSIS — E7849 Other hyperlipidemia: Secondary | ICD-10-CM | POA: Diagnosis not present

## 2023-01-14 NOTE — Patient Instructions (Signed)
Medication Instructions:  No change  *If you need a refill on your cardiac medications before your next appointment, please call your pharmacy*   Lab Work: [facility] If you have labs (blood work) drawn today and your tests are completely normal, you will receive your results only by: MyChart Message (if you have MyChart) OR A paper copy in the mail If you have any lab test that is abnormal or we need to change your treatment, we will call you to review the results.   Testing/Procedures: None    Follow-Up: At [facility], you and your health needs are our priority.  As part of our continuing mission to provide you with exceptional heart care, we have created designated Provider Care Teams.  These Care Teams include your primary Cardiologist (physician) and Advanced Practice Providers (APPs -  Physician Assistants and Nurse Practitioners) who all work together to provide you with the care you need, when you need it.    Your next appointment:   1 year(s)  Provider:   Dr.Hilty  Other Instructions none

## 2023-01-16 LAB — LIPOPROTEIN A (LPA): Lipoprotein (a): 148.9 nmol/L — ABNORMAL HIGH (ref <75.0)

## 2023-01-17 ENCOUNTER — Other Ambulatory Visit (HOSPITAL_COMMUNITY): Payer: Self-pay

## 2023-01-17 NOTE — Progress Notes (Signed)
LIPID CLINIC CONSULT NOTE  Chief Complaint:  Follow-up dyslipidemia  Primary Care Physician: Allwardt, Crist Infante, PA-C  Primary Cardiologist:  None  HPI:  Brenda Frost is a 57 y.o. female who is being seen today for the evaluation of dyslipidemia at the request of Allwardt, Alyssa M, PA-C. 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 She is of [place] ancestry and has been referred for evaluation of marked dyslipidemia. More recently her lipids have demonstrated significant elevation in total cholesterol at 307, triglycerides 67, HDL 90 and LDL of 203. She reports that this is actually been her lipid profile since her teenage years when she first had her cholesterol tested. She has no history of cardiovascular events and it sounds like no significant family history of coronary disease. She is not aware of either of her parents having abnormal cholesterol although these numbers are suggestive of familial hyperlipidemia. She has not been previously on any therapy but was prescribed rosuvastatin 10 mg daily. She has not started the medication yet pending this visit. Currently she is asymptomatic, denying chest pain or worsening shortness of breath. She has some issues with pancreatitis and/or abdominal discomfort which she seems to be managing with diet.  01/26/2022  Brenda Frost returns today for follow-up of dyslipidemia.  I last saw her back in 2021.  At that time we had discussed increasing her statin medication up to 20 mg due to her elevated cholesterol.  Subsequently she has discontinued taking the statin.  She said she was briefly on a keto diet with her husband but noted that her cholesterol had gone up significantly.  Her labs as of August showed total cholesterol 349, triglycerides 72, HDL 81 and LDL 253.  She did have a calcium score that was 0 in 2021.  We discussed this in some detail today including the fact that this may not  necessarily be associated with low cardiovascular risk in patients with familial hyperlipidemia and that treatment with greater than 50% reduction in her lipids is recommended.  06/07/2022  Brenda Frost is seen today in follow-up.  She seems to be tolerating rosuvastatin well.  There has been a significant decrease in her lipids with total cholesterol now 266, down from 349, LDL cholesterol is 175 down from 253.  Her LP(a) as previously mentioned is significantly elevated at 226.4 nmol/L.  Although she has had substantial improvement in her lipids, her cholesterol remains quite high and her LP(a) is not affected by the statin.  In addition to discussing her lipids today, she tells me that she has had 2 episodes of chest discomfort.  She thought these might be indigestion although involve chest pressure that radiated up to the jaw and into her teeth.  1 episode was with exercise that improved about 10 to 20 minutes after she stopped exercising.  Of note she had no coronary calcium in 2021.  01/14/2023  Brenda Frost returns today for follow-up.  She has had an excellent response to lipid-lowering therapy with Repatha in addition to rosuvastatin.  Recent cholesterol showed total 170, triglycerides 58, HDL 86 and LDL 72 (down from a max LDL cholesterol of 253 about a year ago).  We did assess her LP(a) has also substantially improved to 148.9, down from 226.4 nmol/L.  PMHx:  Past Medical History:  Diagnosis Date   Hyperlipidemia    Hypothyroidism     Past Surgical History:  Procedure Laterality Date   CESAREAN  SECTION     ORIF ELBOW FRACTURE Right 02/15/2016   Procedure: OPEN REDUCTION INTERNAL FIXATION (ORIF) ELBOW/OLECRANON FRACTURE;  Surgeon: Bradly Bienenstock, MD;  Location: [facility] OR;  Service: Orthopedics;  Laterality: Right;    FAMHx:  Family History  Problem Relation Age of Onset   Diabetes Mother    Hyperlipidemia Mother    Hypertension Mother    Kidney disease Mother    Heart disease  Maternal Grandmother    CAD Neg Hx    Breast cancer Neg Hx    Colon cancer Neg Hx    Esophageal cancer Neg Hx    Inflammatory bowel disease Neg Hx    Liver disease Neg Hx    Pancreatic cancer Neg Hx    Rectal cancer Neg Hx    Stomach cancer Neg Hx     SOCHx:   reports that she has never smoked. She has never used smokeless tobacco. She reports that she does not currently use alcohol after a past usage of about 1.0 standard drink of alcohol per week. She reports that she does not use drugs.  ALLERGIES:  No Known Allergies  ROS: Pertinent items noted in HPI and remainder of comprehensive ROS otherwise negative.  HOME MEDS: Current Outpatient Medications on File Prior to Visit  Medication Sig Dispense Refill   Evolocumab (REPATHA SURECLICK) 140 MG/ML SOAJ Inject 140 mg into the skin every 14 (fourteen) days. 6 mL 3   levothyroxine (SYNTHROID) 75 MCG tablet Take 1 tablet (75 mcg total) by mouth daily. 90 tablet 3   pantoprazole (PROTONIX) 20 MG tablet Take 1 tablet (20 mg total) by mouth daily. 90 tablet 3   rosuvastatin (CRESTOR) 20 MG tablet Take 1 tablet (20 mg total) by mouth daily. 90 tablet 3   No current facility-administered medications on file prior to visit.    LABS/IMAGING: No results found for this or any previous visit (from the past 48 hour(s)). No results found.  LIPID PANEL:    Component Value Date/Time   CHOL 170 10/25/2022 0828   CHOL 293 (H) 12/08/2017 0820   TRIG 58.0 10/25/2022 0828   HDL 86.10 10/25/2022 0828   HDL 86 12/08/2017 0820   CHOLHDL 2 10/25/2022 0828   VLDL 11.6 10/25/2022 0828   LDLCALC 72 10/25/2022 0828   LDLCALC 192 (H) 12/08/2017 0820    WEIGHTS: Wt Readings from Last 3 Encounters:  01/14/23 167 lb 12.8 oz (76.1 kg)  10/25/22 162 lb 12.8 oz (73.8 kg)  06/07/22 162 lb 9.6 oz (73.8 kg)    VITALS: BP 120/71 (BP Location: Left Arm, Patient Position: Sitting, Cuff Size: Normal)   Pulse (!) 59   Ht 5\' 6"  (1.676 m)   Wt 167 lb  12.8 oz (76.1 kg)   LMP 04/25/2016   SpO2 98%   BMI 27.08 kg/m   EXAM: Deferred  EKG: Deferred  ASSESSMENT: Chest pain, possible angina Probable familial hyperlipidemia -several genetic variants including APO E3/E4, APOA5, LMF1 and CETP variations. Elevated LP(a)-226.4 nmol/L 0 CAC score (2021)  PLAN: 1.   Brenda Frost has had significant improvement in her lipids on combination therapy with Repatha and rosuvastatin.  LP(a) has come down as well substantially.  LDL cholesterol is essentially at goal at 72.  Will continue current medications.  Repeat lipid NMR in about 1 year  and follow-up afterwards.  Chrystie Nose, MD, [facility], FACP  [place]  [facility]  Medical Director of the Advanced Lipid Disorders &  Cardiovascular Risk Reduction Clinic  Diplomate of the [employer] of Clinical Lipidology Attending Cardiologist  Direct Dial: [phone]  Fax: [phone]  Website:  [email or web address]  Brenda Frost 01/17/2023, 7:23 PM

## 2023-01-31 DIAGNOSIS — M79604 Pain in right leg: Secondary | ICD-10-CM | POA: Diagnosis not present

## 2023-02-09 DIAGNOSIS — M79604 Pain in right leg: Secondary | ICD-10-CM | POA: Insufficient documentation

## 2023-02-12 ENCOUNTER — Other Ambulatory Visit (HOSPITAL_COMMUNITY): Payer: Self-pay

## 2023-02-14 ENCOUNTER — Other Ambulatory Visit: Payer: Self-pay

## 2023-02-14 ENCOUNTER — Encounter: Payer: Self-pay | Admitting: Pharmacist

## 2023-02-14 DIAGNOSIS — M79604 Pain in right leg: Secondary | ICD-10-CM | POA: Diagnosis not present

## 2023-02-16 ENCOUNTER — Other Ambulatory Visit: Payer: Self-pay

## 2023-02-21 DIAGNOSIS — M79604 Pain in right leg: Secondary | ICD-10-CM | POA: Diagnosis not present

## 2023-02-23 ENCOUNTER — Other Ambulatory Visit (HOSPITAL_COMMUNITY): Payer: Self-pay

## 2023-02-23 ENCOUNTER — Other Ambulatory Visit: Payer: Self-pay

## 2023-02-28 DIAGNOSIS — M79604 Pain in right leg: Secondary | ICD-10-CM | POA: Diagnosis not present

## 2023-04-26 ENCOUNTER — Other Ambulatory Visit (HOSPITAL_COMMUNITY)
Admission: RE | Admit: 2023-04-26 | Discharge: 2023-04-26 | Disposition: A | Discharge status: Home / Self Care | Payer: Self-pay | Source: Ambulatory Visit | Attending: Oncology | Admitting: Oncology

## 2023-04-26 DIAGNOSIS — Z1379 Encounter for other screening for genetic and chromosomal anomalies: Secondary | ICD-10-CM | POA: Insufficient documentation

## 2023-04-27 ENCOUNTER — Other Ambulatory Visit: Payer: Self-pay | Admitting: Internal Medicine

## 2023-04-27 ENCOUNTER — Other Ambulatory Visit (HOSPITAL_COMMUNITY): Payer: Self-pay

## 2023-04-27 ENCOUNTER — Other Ambulatory Visit: Payer: Self-pay

## 2023-04-27 MED ORDER — ROSUVASTATIN CALCIUM 20 MG PO TABS
20.0000 mg | ORAL_TABLET | Freq: Every day | ORAL | 3 refills | Status: AC
  Filled 2023-08-29: qty 90, 90d supply, fill #1
  Filled 2024-01-11: qty 90, 90d supply, fill #2
  Filled 2024-04-12: qty 90, 90d supply, fill #3

## 2023-04-28 ENCOUNTER — Other Ambulatory Visit (HOSPITAL_COMMUNITY): Payer: Self-pay

## 2023-05-10 LAB — GENECONNECT MOLECULAR SCREEN: Genetic Analysis Overall Interpretation: NEGATIVE

## 2023-06-03 ENCOUNTER — Other Ambulatory Visit (HOSPITAL_COMMUNITY): Payer: Self-pay

## 2023-06-03 ENCOUNTER — Ambulatory Visit: Admitting: Family Medicine

## 2023-06-03 ENCOUNTER — Encounter: Payer: Self-pay | Admitting: Family Medicine

## 2023-06-03 VITALS — BP 117/81 | HR 81 | Temp 97.7°F | Resp 18 | Ht 66.0 in | Wt 168.4 lb

## 2023-06-03 DIAGNOSIS — R051 Acute cough: Secondary | ICD-10-CM

## 2023-06-03 DIAGNOSIS — J0100 Acute maxillary sinusitis, unspecified: Secondary | ICD-10-CM

## 2023-06-03 LAB — POCT INFLUENZA A/B
Influenza A, POC: NEGATIVE
Influenza B, POC: NEGATIVE

## 2023-06-03 MED ORDER — PREDNISONE 20 MG PO TABS
40.0000 mg | ORAL_TABLET | Freq: Every day | ORAL | 0 refills | Status: AC
  Filled 2023-06-03: qty 10, 5d supply, fill #0

## 2023-06-03 MED ORDER — CEFDINIR 300 MG PO CAPS
300.0000 mg | ORAL_CAPSULE | Freq: Two times a day (BID) | ORAL | 0 refills | Status: DC
  Filled 2023-06-03: qty 14, 7d supply, fill #0

## 2023-06-03 NOTE — Patient Instructions (Signed)
 Sstart prednisone and omnicef

## 2023-06-03 NOTE — Progress Notes (Signed)
 Subjective:     Patient ID: Brenda Frost, female    DOB: 1966/02/18, 58 y.o.   MRN: [id number]  Chief Complaint  Patient presents with   Ear Pain    Left ear pain that started Tuesday   Nasal Congestion    Feels like possible sinus infection, head feels foggy   Cough    Cough started Tuesday, has thick sputum, unable to cough up Has been taking OTC decongestant     HPI Discussed the use of AI scribe software for clinical note transcription with the patient, who gave verbal consent to proceed.  History of Present Illness   Brenda Frost is a 58 year old female who presents with upper respiratory symptoms and concern for potential infection due to her husband's recent lung cancer diagnosis.  She began experiencing upper respiratory symptoms on Tuesday, including a severe sore throat, nasal congestion, and cough. She also has left ear pain and a sensation of fullness and fogginess in her head, raising concerns about a possible sinus infection. Her symptoms are concentrated in the nose and upper face area, with difficulty expectorating thick, dark yellow sputum and an inability to effectively blow her nose due to congestion. She also experiences postnasal drip and a sensation of facial swelling.  She has been taking daily COVID tests since the onset of symptoms, all of which have been negative. No flu-like aches, asthma, or allergies. Her primary concern is the potential transmission of any infection to her husband, who is immunocompromised due to lung cancer. She has been wearing a mask around him and maintaining cleanliness at home with Clorox and Lysol.  Her husband was recently diagnosed with lung cancer and had a previous COVID infection. She is particularly concerned about transmitting any potential infection to him, given his compromised immune status.  She has a history of familial hyperlipidemia, managed with Repatha, thyroid medication, and rosuvastatin. Genetic testing has  confirmed familial hyperlipidemia, and her condition is under control with these treatments. She works part-time for [employer] and full-time for [employer].       Health Maintenance Due  Topic Date Due   Hepatitis C Screening  Never done   COLON CANCER SCREENING ANNUAL FOBT  10/22/2021   MAMMOGRAM  05/11/2022    Past Medical History:  Diagnosis Date   Hyperlipidemia    Hypothyroidism     Past Surgical History:  Procedure Laterality Date   CESAREAN SECTION     ORIF ELBOW FRACTURE Right 02/15/2016   Procedure: OPEN REDUCTION INTERNAL FIXATION (ORIF) ELBOW/OLECRANON FRACTURE;  Surgeon: Bradly Bienenstock, MD;  Location: [facility] OR;  Service: Orthopedics;  Laterality: Right;     Current Outpatient Medications:    cefdinir (OMNICEF) 300 MG capsule, Take 1 capsule (300 mg total) by mouth 2 (two) times daily., Disp: 14 capsule, Rfl: 0   Evolocumab (REPATHA SURECLICK) 140 MG/ML SOAJ, Inject 140 mg into the skin every 14 (fourteen) days., Disp: 6 mL, Rfl: 3   levothyroxine (SYNTHROID) 75 MCG tablet, Take 1 tablet (75 mcg total) by mouth daily., Disp: 90 tablet, Rfl: 3   pantoprazole (PROTONIX) 20 MG tablet, Take 1 tablet (20 mg total) by mouth daily., Disp: 90 tablet, Rfl: 3   predniSONE (DELTASONE) 20 MG tablet, Take 2 tablets (40 mg total) by mouth daily with breakfast for 5 days., Disp: 10 tablet, Rfl: 0   rosuvastatin (CRESTOR) 20 MG tablet, Take 1 tablet (20 mg total) by mouth daily., Disp: 90 tablet, Rfl: 3  No  Known Allergies ROS neg/noncontributory except as noted HPI/below      Objective:     BP 117/81   Pulse 81   Temp 97.7 F (36.5 C) (Temporal)   Resp 18   Ht 5\' 6"  (1.676 m)   Wt 168 lb 6 oz (76.4 kg)   LMP 04/25/2016   SpO2 100%   BMI 27.18 kg/m  Wt Readings from Last 3 Encounters:  06/03/23 168 lb 6 oz (76.4 kg)  01/14/23 167 lb 12.8 oz (76.1 kg)  10/25/22 162 lb 12.8 oz (73.8 kg)    Physical Exam   Gen: WDWN NAD HEENT: NCAT, conjunctiva not injected,  sclera nonicteric TM WNL B, OP moist, no exudates  very tender to percussion L max sinus NECK:  supple, no thyromegaly, no nodes,  CARDIAC: RRR, S1S2+, no murmur.  LUNGS: CTAB. No wheezes EXT:  no edema MSK: no gross abnormalities.  NEURO: A&O x3.  CN II-XII intact.  PSYCH: normal mood. Good eye contact  Results for orders placed or performed in visit on 06/03/23  POCT Influenza A/B   Collection Time: 06/03/23 11:59 AM  Result Value Ref Range   Influenza A, POC Negative Negative   Influenza B, POC Negative Negative        Assessment & Plan:  Acute non-recurrent maxillary sinusitis  Acute cough -     POCT Influenza A/B  Other orders -     Cefdinir; Take 1 capsule (300 mg total) by mouth 2 (two) times daily.  Dispense: 14 capsule; Refill: 0 -     predniSONE; Take 2 tablets (40 mg total) by mouth daily with breakfast for 5 days.  Dispense: 10 tablet; Refill: 0  Assessment and Plan    Sinusitis   She presents with symptoms consistent with sinusitis, including left ear pain, nasal congestion, postnasal drip, and a sensation of facial swelling. Symptoms began a few days ago and are localized to the upper respiratory tract. Daily COVID-19 tests are negative, and there are no flu-like symptoms. While a viral cause is considered, treatment for bacterial sinusitis is initiated due to severity and persistence. Z-Pak is not optimal for sinuses; Omnicef (cefdinir) is recommended. Prednisone is discussed for severe congestion,. Prescribe Omnicef (cefdinir). Advise increased fluid intake. Avoid close contact with her husband due to his immunocompromised state from a recent lung cancer diagnosis.  Familial Hyperlipidemia   Her familial hyperlipidemia is well-controlled with Repatha and rosuvastatin.  General Health Maintenance   The importance of up-to-date health screenings and vaccinations is discussed. Plan to check hepatitis C status during the upcoming physical exam.        Return if  symptoms worsen or fail to improve.  Angelena Sole, MD

## 2023-06-23 ENCOUNTER — Ambulatory Visit: Admitting: Physician Assistant

## 2023-06-24 ENCOUNTER — Encounter (HOSPITAL_BASED_OUTPATIENT_CLINIC_OR_DEPARTMENT_OTHER): Payer: Self-pay | Admitting: Student

## 2023-06-24 ENCOUNTER — Ambulatory Visit (HOSPITAL_BASED_OUTPATIENT_CLINIC_OR_DEPARTMENT_OTHER): Admitting: Student

## 2023-06-24 ENCOUNTER — Other Ambulatory Visit (HOSPITAL_BASED_OUTPATIENT_CLINIC_OR_DEPARTMENT_OTHER): Payer: Self-pay

## 2023-06-24 ENCOUNTER — Ambulatory Visit (HOSPITAL_BASED_OUTPATIENT_CLINIC_OR_DEPARTMENT_OTHER)

## 2023-06-24 DIAGNOSIS — M7531 Calcific tendinitis of right shoulder: Secondary | ICD-10-CM | POA: Diagnosis not present

## 2023-06-24 DIAGNOSIS — M25511 Pain in right shoulder: Secondary | ICD-10-CM

## 2023-06-24 MED ORDER — MELOXICAM 15 MG PO TABS
15.0000 mg | ORAL_TABLET | Freq: Every day | ORAL | 0 refills | Status: AC
  Filled 2023-06-24: qty 14, 14d supply, fill #0

## 2023-06-24 NOTE — Progress Notes (Signed)
 Chief Complaint: Shoulder pain    Discussed the use of AI scribe software for clinical note transcription with the patient, who gave verbal consent to proceed.  History of Present Illness Brenda Frost is a 58 year old female who presents with right shoulder pain.  She experiences right shoulder pain that began over the last few days after carrying a heavy work bag, approximately 35 pounds, on her right shoulder during a trip back from [place].  The pain is described as very tender, primarily located on the outside of the shoulder, radiating to the bicep, and exacerbated by certain arm positions. She is right-handed, which adds to the functional impact of the pain. No significant weakness or inability to move the shoulder completely is noted.  She recalls a similar episode in 2022, which was attributed to biceps tendonitis and bursitis, and was effectively managed with anti-inflammatory medications, specifically naproxen, along with rest and ice. She notes that the previous treatment resolved the symptoms within two days.  Surgical History:   None  PMH/PSH/Family History/Social History/Meds/Allergies:    Past Medical History:  Diagnosis Date   Hyperlipidemia    Hypothyroidism    Past Surgical History:  Procedure Laterality Date   CESAREAN SECTION     ORIF ELBOW FRACTURE Right 02/15/2016   Procedure: OPEN REDUCTION INTERNAL FIXATION (ORIF) ELBOW/OLECRANON FRACTURE;  Surgeon: Bradly Bienenstock, MD;  Location: [facility] OR;  Service: Orthopedics;  Laterality: Right;   Social History   Socioeconomic History   Marital status: Married    Spouse name: Not on file   Number of children: 2   Years of education: Not on file   Highest education level: Doctorate  Occupational History   Occupation: IT trainer: [employer]  Tobacco Use   Smoking status: Never   Smokeless tobacco: Never  Vaping Use   Vaping status: Never Used  Substance  and Sexual Activity   Alcohol use: Not Currently    Alcohol/week: 1.0 standard drink of alcohol    Types: 1 Cans of beer per week   Drug use: No   Sexual activity: Not on file  Other Topics Concern   Not on file  Social History Narrative   Not on file   Social Drivers of Health   Financial Resource Strain: Low Risk  (06/23/2023)   Overall Financial Resource Strain (CARDIA)    Difficulty of Paying Living Expenses: Not hard at all  Food Insecurity: No Food Insecurity (06/23/2023)   Hunger Vital Sign    Worried About Running Out of Food in the Last Year: Never true    Ran Out of Food in the Last Year: Never true  Transportation Needs: No Transportation Needs (06/23/2023)   PRAPARE - Administrator, Civil Service (Medical): No    Lack of Transportation (Non-Medical): No  Physical Activity: Sufficiently Active (06/23/2023)   Exercise Vital Sign    Days of Exercise per Week: 5 days    Minutes of Exercise per Session: 60 min  Stress: No Stress Concern Present (06/23/2023)   [employer] of Occupational Health - Occupational Stress Questionnaire    Feeling of Stress : Only a little  Social Connections: Moderately Integrated (06/23/2023)   Social Connection and Isolation Panel [NHANES]    Frequency of Communication with Friends and Family:  More than three times a week    Frequency of Social Gatherings with Friends and Family: Once a week    Attends Religious Services: 1 to 4 times per year    Active Member of [employer] or Organizations: No    Attends Engineer, structural: Not on file    Marital Status: Married   Family History  Problem Relation Age of Onset   Diabetes Mother    Hyperlipidemia Mother    Hypertension Mother    Kidney disease Mother    Heart disease Maternal Grandmother    CAD Neg Hx    Breast cancer Neg Hx    Colon cancer Neg Hx    Esophageal cancer Neg Hx    Inflammatory bowel disease Neg Hx    Liver disease Neg Hx    Pancreatic cancer Neg Hx     Rectal cancer Neg Hx    Stomach cancer Neg Hx    No Known Allergies Current Outpatient Medications  Medication Sig Dispense Refill   cefdinir (OMNICEF) 300 MG capsule Take 1 capsule (300 mg total) by mouth 2 (two) times daily. 14 capsule 0   Evolocumab (REPATHA SURECLICK) 140 MG/ML SOAJ Inject 140 mg into the skin every 14 (fourteen) days. 6 mL 3   levothyroxine (SYNTHROID) 75 MCG tablet Take 1 tablet (75 mcg total) by mouth daily. 90 tablet 3   pantoprazole (PROTONIX) 20 MG tablet Take 1 tablet (20 mg total) by mouth daily. 90 tablet 3   rosuvastatin (CRESTOR) 20 MG tablet Take 1 tablet (20 mg total) by mouth daily. 90 tablet 3   No current facility-administered medications for this visit.   No results found.  Review of Systems:   A ROS was performed including pertinent positives and negatives as documented in the HPI.  Physical Exam :   Constitutional: NAD and appears stated age Neurological: Alert and oriented Psych: Appropriate affect and cooperative Last menstrual period 04/25/2016.   Comprehensive Musculoskeletal Exam:    Right shoulder exam demonstrates tenderness palpation of the lateral deltoid and anterior glenohumeral joint.  Active range of motion to 140 degrees forward flexion and 30 degrees external rotation compared to 160 flexion/50 ER of contralateral shoulder.  Positive Neer, Hawkins, and pain with empty can testing.  Imaging:   Xray (right shoulder 3 views): Calcification noted lateral to the humeral head suggesting calcific tendinitis.  No other evidence of bony abnormality.   I personally reviewed and interpreted the radiographs.      Assessment & Plan Acute pain of the Right Shoulder   Pain is located in the anterior and lateral shoulder exacerbated by recent overuse mechanism without injury.  X-rays do show significant calcific tendinitis noted off the greater tuberosity suggesting chronic inflammation.  There is also a likely component of biceps  tendinitis given her history with this as well as pain distribution in the anterior shoulder.  Discussed treatment options including NSAIDs versus cortisone injections.  Patient would like to proceed with short course of meloxicam as this has helped her previously.  Will let us know if this does not give her relief and may consider returning for subacromial versus biceps tendon injection.    I personally saw and evaluated the patient, and participated in the management and treatment plan.  Hazle Nordmann, PA-C Orthopedics

## 2023-07-20 ENCOUNTER — Encounter: Payer: Self-pay | Admitting: Pharmacy Technician

## 2023-07-20 ENCOUNTER — Telehealth: Payer: Self-pay | Admitting: Pharmacy Technician

## 2023-07-20 ENCOUNTER — Other Ambulatory Visit: Payer: Self-pay | Admitting: Internal Medicine

## 2023-07-20 ENCOUNTER — Other Ambulatory Visit (HOSPITAL_COMMUNITY): Payer: Self-pay

## 2023-07-20 DIAGNOSIS — E7800 Pure hypercholesterolemia, unspecified: Secondary | ICD-10-CM

## 2023-07-20 DIAGNOSIS — E7841 Elevated Lipoprotein(a): Secondary | ICD-10-CM

## 2023-07-20 DIAGNOSIS — E7849 Other hyperlipidemia: Secondary | ICD-10-CM

## 2023-07-20 MED ORDER — REPATHA SURECLICK 140 MG/ML ~~LOC~~ SOAJ
1.0000 mL | SUBCUTANEOUS | 3 refills | Status: AC
  Filled 2023-07-20 – 2023-08-01 (×3): qty 6, 84d supply, fill #0
  Filled 2023-10-20: qty 6, 84d supply, fill #1
  Filled 2024-01-11: qty 6, 84d supply, fill #2
  Filled 2024-04-12: qty 6, 84d supply, fill #3

## 2023-07-20 NOTE — Telephone Encounter (Signed)
 Pharmacy Patient Advocate Encounter   Received notification from Onbase that prior authorization for repatha  is required/requested.   Insurance verification completed.   The patient is insured through [facility] .   Per test claim: PA required; PA submitted to above mentioned insurance via CoverMyMeds Key/confirmation #/EOC [id number] Status is pending

## 2023-07-20 NOTE — Telephone Encounter (Signed)
 Sent patient this message: Hi! I hope you are doing well! Your coupon for  repatha   has been provided to your pharmacy. Below is the coupon information for your records. If the copay is higher than expected, please make sure the pharmacy has ran your insurance and your coupon. If you need anything else please let me know!     BIN: [id number] PCN: [id number] ID: [id number] GROUP: [id number]  Copay is now: 45.00 for 3 months

## 2023-07-20 NOTE — Telephone Encounter (Signed)
 Pharmacy Patient Advocate Encounter  Received notification from [facility] that Prior Authorization for repatha  has been APPROVED from 07/20/23 to 07/19/24. Spoke to pharmacy to process.Copay is $180 for 3 months.    PA #/Case ID/Reference #: [id number]    I got her a coupon

## 2023-08-01 ENCOUNTER — Other Ambulatory Visit: Payer: Self-pay

## 2023-08-01 ENCOUNTER — Other Ambulatory Visit (HOSPITAL_COMMUNITY): Payer: Self-pay

## 2023-08-29 DIAGNOSIS — Z6829 Body mass index (BMI) 29.0-29.9, adult: Secondary | ICD-10-CM | POA: Diagnosis not present

## 2023-08-29 DIAGNOSIS — Z1231 Encounter for screening mammogram for malignant neoplasm of breast: Secondary | ICD-10-CM | POA: Diagnosis not present

## 2023-08-29 DIAGNOSIS — Z124 Encounter for screening for malignant neoplasm of cervix: Secondary | ICD-10-CM | POA: Diagnosis not present

## 2023-08-29 DIAGNOSIS — Z01419 Encounter for gynecological examination (general) (routine) without abnormal findings: Secondary | ICD-10-CM | POA: Diagnosis not present

## 2023-08-29 DIAGNOSIS — Z1151 Encounter for screening for human papillomavirus (HPV): Secondary | ICD-10-CM | POA: Diagnosis not present

## 2023-08-29 DIAGNOSIS — Z13820 Encounter for screening for osteoporosis: Secondary | ICD-10-CM | POA: Diagnosis not present

## 2023-08-29 LAB — HM MAMMOGRAPHY

## 2023-10-20 ENCOUNTER — Other Ambulatory Visit: Payer: Self-pay

## 2023-10-26 ENCOUNTER — Ambulatory Visit: Payer: BC Managed Care – PPO | Admitting: Physician Assistant

## 2023-10-26 ENCOUNTER — Ambulatory Visit: Payer: Self-pay | Admitting: Physician Assistant

## 2023-10-26 ENCOUNTER — Encounter: Payer: Self-pay | Admitting: Physician Assistant

## 2023-10-26 VITALS — BP 118/78 | HR 94 | Temp 97.3°F | Ht 65.5 in | Wt 174.0 lb

## 2023-10-26 DIAGNOSIS — Z131 Encounter for screening for diabetes mellitus: Secondary | ICD-10-CM

## 2023-10-26 DIAGNOSIS — E038 Other specified hypothyroidism: Secondary | ICD-10-CM | POA: Diagnosis not present

## 2023-10-26 DIAGNOSIS — Z0000 Encounter for general adult medical examination without abnormal findings: Secondary | ICD-10-CM | POA: Diagnosis not present

## 2023-10-26 DIAGNOSIS — Z1159 Encounter for screening for other viral diseases: Secondary | ICD-10-CM | POA: Diagnosis not present

## 2023-10-26 DIAGNOSIS — Z1211 Encounter for screening for malignant neoplasm of colon: Secondary | ICD-10-CM

## 2023-10-26 DIAGNOSIS — Z23 Encounter for immunization: Secondary | ICD-10-CM | POA: Diagnosis not present

## 2023-10-26 DIAGNOSIS — E782 Mixed hyperlipidemia: Secondary | ICD-10-CM | POA: Diagnosis not present

## 2023-10-26 LAB — TSH: TSH: 1.71 u[IU]/mL (ref 0.35–5.50)

## 2023-10-26 LAB — CBC WITH DIFFERENTIAL/PLATELET
Basophils Absolute: 0 K/uL (ref 0.0–0.1)
Basophils Relative: 0.3 % (ref 0.0–3.0)
Eosinophils Absolute: 0.1 K/uL (ref 0.0–0.7)
Eosinophils Relative: 2.9 % (ref 0.0–5.0)
HCT: 43.2 % (ref 36.0–46.0)
Hemoglobin: 14.2 g/dL (ref 12.0–15.0)
Lymphocytes Relative: 27.8 % (ref 12.0–46.0)
Lymphs Abs: 1.2 K/uL (ref 0.7–4.0)
MCHC: 32.9 g/dL (ref 30.0–36.0)
MCV: 89.9 fl (ref 78.0–100.0)
Monocytes Absolute: 0.4 K/uL (ref 0.1–1.0)
Monocytes Relative: 9.1 % (ref 3.0–12.0)
Neutro Abs: 2.7 K/uL (ref 1.4–7.7)
Neutrophils Relative %: 59.9 % (ref 43.0–77.0)
Platelets: 165 K/uL (ref 150.0–400.0)
RBC: 4.8 Mil/uL (ref 3.87–5.11)
RDW: 13.2 % (ref 11.5–15.5)
WBC: 4.5 K/uL (ref 4.0–10.5)

## 2023-10-26 LAB — COMPREHENSIVE METABOLIC PANEL WITH GFR
ALT: 14 U/L (ref 0–35)
AST: 17 U/L (ref 0–37)
Albumin: 4.1 g/dL (ref 3.5–5.2)
Alkaline Phosphatase: 86 U/L (ref 39–117)
BUN: 18 mg/dL (ref 6–23)
CO2: 30 meq/L (ref 19–32)
Calcium: 9.9 mg/dL (ref 8.4–10.5)
Chloride: 104 meq/L (ref 96–112)
Creatinine, Ser: 0.88 mg/dL (ref 0.40–1.20)
GFR: 73.09 mL/min (ref >60.00)
Glucose, Bld: 118 mg/dL — ABNORMAL HIGH (ref 70–99)
Potassium: 4.3 meq/L (ref 3.5–5.1)
Sodium: 141 meq/L (ref 135–145)
Total Bilirubin: 0.8 mg/dL (ref 0.2–1.2)
Total Protein: 7.1 g/dL (ref 6.0–8.3)

## 2023-10-26 LAB — HEMOGLOBIN A1C: Hgb A1c MFr Bld: 6.2 % (ref 4.6–6.5)

## 2023-10-26 LAB — LIPID PANEL
Cholesterol: 177 mg/dL (ref 0–200)
HDL: 85.1 mg/dL (ref >39.00)
LDL Cholesterol: 82 mg/dL (ref 0–99)
NonHDL: 92.18
Total CHOL/HDL Ratio: 2
Triglycerides: 49 mg/dL (ref 0.0–149.0)
VLDL: 9.8 mg/dL (ref 0.0–40.0)

## 2023-10-26 NOTE — Progress Notes (Signed)
 Patient ID: Brenda Frost, female    DOB: 04/28/1965, 58 y.o.   MRN: [id number]   Assessment & Plan:  Annual physical exam -     CBC with Differential/Platelet -     Comprehensive metabolic panel with GFR -     Hemoglobin A1c -     Lipid panel -     TSH  Need for hepatitis C screening test -     Hepatitis C antibody  Need for pneumococcal vaccine -     Pneumococcal conjugate vaccine 20-valent  Screening for colon cancer -     Cologuard; Future      Assessment and Plan Assessment & Plan Hypothyroidism -Stable on Synthroid  75 mcg  - Recheck TSH today   Hyperlipidemia on PCSK9 inhibitor therapy Hyperlipidemia managed with Repatha , resulting in significant cholesterol reduction without adverse effects. - Order lipid panel to assess current cholesterol levels.  Age-appropriate screening and counseling performed today. Will check labs and call with results. Preventive measures discussed and printed in AVS for patient.   Patient Counseling: [x]   Nutrition: Stressed importance of moderation in sodium/caffeine intake, saturated fat and cholesterol, caloric balance, sufficient intake of fresh fruits, vegetables, and fiber.  [x]   Stressed the importance of regular exercise.   []   Substance Abuse: Discussed cessation/primary prevention of tobacco, alcohol, or other drug use; driving or other dangerous activities under the influence; availability of treatment for abuse.   []   Injury prevention: Discussed safety belts, safety helmets, smoke detector, smoking near bedding or upholstery.   []   Sexuality: Discussed sexually transmitted diseases, partner selection, use of condoms, avoidance of unintended pregnancy  and contraceptive alternatives.   [x]   Dental health: Discussed importance of regular tooth brushing, flossing, and dental visits.  [x]   Health maintenance and immunizations reviewed. Please refer to Health maintenance section.           Return in about 1 year  (around 10/25/2024) for physical.    Subjective:    Chief Complaint  Patient presents with   Annual Exam    Pt in office for annual CPE and fasting labs;     HPI Discussed the use of AI scribe software for clinical note transcription with the patient, who gave verbal consent to proceed.  History of Present Illness Brenda Frost is a 58 year old female who presents for a follow-up visit after a year.  She is managing significant stress due to her husband's recent lung cancer diagnosis, compounded by her daughter's previous cancer experience. Her husband is currently on oral chemotherapy after completing chemo and radiation, and he experienced pneumonitis from radiation but is now improving.  She has experienced weight fluctuations, having lost 30 pounds and regained 20 pounds. To manage her weight and stress, she has resumed running and started heavy weight training at the gym. She is mindful of her diet and aims to return to a comfortable weight.  She continues to take thyroid  medication and has been doing well with Repatha , which has significantly lowered her cholesterol levels. She is due for lab work to check her lipids and hemoglobin A1c.  She has a history of easy bruising, stating 'I barely touch things and I don't, I'll have bruises on me', but denies any other bleeding issues.   She had genetic testing done through a program but has not fully understood the results yet. The major findings were negative, and she has not been contacted for any abnormalities.     Past  Medical History:  Diagnosis Date   Hyperlipidemia    Hypothyroidism     Past Surgical History:  Procedure Laterality Date   CESAREAN SECTION     ORIF ELBOW FRACTURE Right 02/15/2016   Procedure: OPEN REDUCTION INTERNAL FIXATION (ORIF) ELBOW/OLECRANON FRACTURE;  Surgeon: Prentice Pagan, MD;  Location: [facility] OR;  Service: Orthopedics;  Laterality: Right;    Family History  Problem Relation Age of Onset    Diabetes Mother    Hyperlipidemia Mother    Hypertension Mother    Kidney disease Mother    Heart disease Maternal Grandmother    CAD Neg Hx    Breast cancer Neg Hx    Colon cancer Neg Hx    Esophageal cancer Neg Hx    Inflammatory bowel disease Neg Hx    Liver disease Neg Hx    Pancreatic cancer Neg Hx    Rectal cancer Neg Hx    Stomach cancer Neg Hx     Social History   Tobacco Use   Smoking status: Never   Smokeless tobacco: Never  Vaping Use   Vaping status: Never Used  Substance Use Topics   Alcohol use: Not Currently    Alcohol/week: 1.0 standard drink of alcohol    Types: 1 Cans of beer per week   Drug use: No     No Known Allergies  Review of Systems NEGATIVE UNLESS OTHERWISE INDICATED IN HPI      Objective:     BP 118/78 (BP Location: Left Arm, Patient Position: Sitting, Cuff Size: Normal)   Pulse 94   Temp (!) 97.3 F (36.3 C) (Temporal)   Ht 5' 5.5 (1.664 m)   Wt 174 lb (78.9 kg)   LMP 04/25/2016   SpO2 95%   BMI 28.51 kg/m   Wt Readings from Last 3 Encounters:  10/26/23 174 lb (78.9 kg)  06/03/23 168 lb 6 oz (76.4 kg)  01/14/23 167 lb 12.8 oz (76.1 kg)    BP Readings from Last 3 Encounters:  10/26/23 118/78  06/03/23 117/81  01/14/23 120/71     Physical Exam Vitals and nursing note reviewed.  Constitutional:      Appearance: Normal appearance. She is normal weight. She is not toxic-appearing.  HENT:     Head: Normocephalic and atraumatic.     Right Ear: Tympanic membrane, ear canal and external ear normal.     Left Ear: Tympanic membrane, ear canal and external ear normal.     Nose: Nose normal.     Mouth/Throat:     Mouth: Mucous membranes are moist.  Eyes:     Extraocular Movements: Extraocular movements intact.     Conjunctiva/sclera: Conjunctivae normal.     Pupils: Pupils are equal, round, and reactive to light.  Cardiovascular:     Rate and Rhythm: Normal rate and regular rhythm.     Pulses: Normal pulses.     Heart  sounds: Normal heart sounds.  Pulmonary:     Effort: Pulmonary effort is normal.     Breath sounds: Normal breath sounds.  Abdominal:     General: Abdomen is flat. Bowel sounds are normal.     Palpations: Abdomen is soft.  Musculoskeletal:        General: Normal range of motion.     Cervical back: Normal range of motion and neck supple.  Skin:    General: Skin is warm and dry.  Neurological:     General: No focal deficit present.     Mental Status:  She is alert and oriented to person, place, and time.  Psychiatric:        Mood and Affect: Mood normal.        Behavior: Behavior normal.        Thought Content: Thought content normal.        Judgment: Judgment normal.             Achille Xiang M Noreene Boreman, PA-C

## 2023-10-26 NOTE — Patient Instructions (Signed)
Keep up great work!

## 2023-10-27 LAB — HEPATITIS C ANTIBODY: Hepatitis C Ab: NONREACTIVE

## 2023-12-23 ENCOUNTER — Ambulatory Visit (INDEPENDENT_AMBULATORY_CARE_PROVIDER_SITE_OTHER)

## 2023-12-23 ENCOUNTER — Ambulatory Visit (INDEPENDENT_AMBULATORY_CARE_PROVIDER_SITE_OTHER): Admitting: Student

## 2023-12-23 ENCOUNTER — Other Ambulatory Visit (HOSPITAL_BASED_OUTPATIENT_CLINIC_OR_DEPARTMENT_OTHER): Payer: Self-pay

## 2023-12-23 DIAGNOSIS — M25562 Pain in left knee: Secondary | ICD-10-CM | POA: Diagnosis not present

## 2023-12-23 DIAGNOSIS — M1712 Unilateral primary osteoarthritis, left knee: Secondary | ICD-10-CM | POA: Diagnosis not present

## 2023-12-23 MED ORDER — METHYLPREDNISOLONE 4 MG PO TBPK
ORAL_TABLET | ORAL | 0 refills | Status: AC
  Filled 2023-12-23: qty 21, 6d supply, fill #0

## 2023-12-23 NOTE — Progress Notes (Signed)
 Chief Complaint: Left knee pain    Discussed the use of AI scribe software for clinical note transcription with the patient, who gave verbal consent to proceed.  History of Present Illness Brenda Frost is a 58 year old female who presents with left knee pain. Knee pain began two to three weeks ago, radiating from the knee to the ankle. It is accompanied by stiffness and difficulty bending the knee, worsening after prolonged sitting and relieved by standing and walking. She takes ibuprofen and performs hip exercises to alleviate stiffness. There is no numbness, tingling, or back pain, but there is a sensation of tightness in her tennis shoe. The pain disrupts sleep and is not associated with popping or locking of the knee.   Surgical History:   None  PMH/PSH/Family History/Social History/Meds/Allergies:    Past Medical History:  Diagnosis Date   Hyperlipidemia    Hypothyroidism    Past Surgical History:  Procedure Laterality Date   CESAREAN SECTION     ORIF ELBOW FRACTURE Right 02/15/2016   Procedure: OPEN REDUCTION INTERNAL FIXATION (ORIF) ELBOW/OLECRANON FRACTURE;  Surgeon: Prentice Pagan, MD;  Location: [facility] OR;  Service: Orthopedics;  Laterality: Right;   Social History   Socioeconomic History   Marital status: Married    Spouse name: Not on file   Number of children: 2   Years of education: Not on file   Highest education level: Doctorate  Occupational History   Occupation: IT trainer: [employer]  Tobacco Use   Smoking status: Never   Smokeless tobacco: Never  Vaping Use   Vaping status: Never Used  Substance and Sexual Activity   Alcohol use: Not Currently    Alcohol/week: 1.0 standard drink of alcohol    Types: 1 Cans of beer per week   Drug use: No   Sexual activity: Not on file  Other Topics Concern   Not on file  Social History Narrative   Not on file   Social Drivers of Health   Financial  Resource Strain: Low Risk  (06/23/2023)   Overall Financial Resource Strain (CARDIA)    Difficulty of Paying Living Expenses: Not hard at all  Food Insecurity: No Food Insecurity (06/23/2023)   Hunger Vital Sign    Worried About Running Out of Food in the Last Year: Never true    Ran Out of Food in the Last Year: Never true  Transportation Needs: No Transportation Needs (06/23/2023)   PRAPARE - Administrator, Civil Service (Medical): No    Lack of Transportation (Non-Medical): No  Physical Activity: Sufficiently Active (06/23/2023)   Exercise Vital Sign    Days of Exercise per Week: 5 days    Minutes of Exercise per Session: 60 min  Stress: No Stress Concern Present (06/23/2023)   [employer] of Occupational Health - Occupational Stress Questionnaire    Feeling of Stress : Only a little  Social Connections: Moderately Integrated (06/23/2023)   Social Connection and Isolation Panel    Frequency of Communication with Friends and Family: More than three times a week    Frequency of Social Gatherings with Friends and Family: Once a week    Attends Religious Services: 1 to 4 times per year    Active Member of Clubs or Organizations: No  Attends Banker Meetings: Not on file    Marital Status: Married   Family History  Problem Relation Age of Onset   Diabetes Mother    Hyperlipidemia Mother    Hypertension Mother    Kidney disease Mother    Heart disease Maternal Grandmother    CAD Neg Hx    Breast cancer Neg Hx    Colon cancer Neg Hx    Esophageal cancer Neg Hx    Inflammatory bowel disease Neg Hx    Liver disease Neg Hx    Pancreatic cancer Neg Hx    Rectal cancer Neg Hx    Stomach cancer Neg Hx    No Known Allergies Current Outpatient Medications  Medication Sig Dispense Refill   methylPREDNISolone  (MEDROL  DOSEPAK) 4 MG TBPK tablet Take per packet instructions 21 tablet 0   Evolocumab  (REPATHA  SURECLICK) 140 MG/ML SOAJ Inject 140 mg into the skin  every 14 (fourteen) days. 6 mL 3   levothyroxine  (SYNTHROID ) 75 MCG tablet Take 1 tablet (75 mcg total) by mouth daily. 90 tablet 3   pantoprazole  (PROTONIX ) 20 MG tablet Take 1 tablet (20 mg total) by mouth daily. 90 tablet 3   rosuvastatin  (CRESTOR ) 20 MG tablet Take 1 tablet (20 mg total) by mouth daily. 90 tablet 3   No current facility-administered medications for this visit.   No results found.  Review of Systems:   A ROS was performed including pertinent positives and negatives as documented in the HPI.  Physical Exam :   Constitutional: NAD and appears stated age Neurological: Alert and oriented Psych: Appropriate affect and cooperative Last menstrual period 04/25/2016.   Comprehensive Musculoskeletal Exam:    Left knee exam demonstrates pain mild bilateral joint line tenderness.  No significant effusion, erythema, or warmth.  Active range of motion from 0 to 110 degrees without crepitus.  Stable collaterals with varus and valgus stress.  Calf supple and nontender.  Imaging:   Xray (left knee 4 views): Mild degenerative changes within the medial and patellofemoral compartments.  Negative for acute abnormality.   I personally reviewed and interpreted the radiographs.      Assessment & Plan Left knee pain with mild osteoarthritis   X-rays revealed mild arthritis with bone spurs and cartilage loss. Radiating pain suggests that pain may not be isolated to the knee joint. Ibuprofen provided some relief, but pain continues. Prescribe oral prednisone  taper for 6 days and discontinue ibuprofen during this period. Monitor response to prednisone  over the weekend, expecting improvement by Sunday. If pain persists by early next week, consider further evaluation and possible knee injection.      I personally saw and evaluated the patient, and participated in the management and treatment plan.  Leonce Reveal, PA-C Orthopedics

## 2024-01-11 ENCOUNTER — Other Ambulatory Visit: Payer: Self-pay

## 2024-01-11 ENCOUNTER — Other Ambulatory Visit: Payer: Self-pay | Admitting: Physician Assistant

## 2024-01-11 ENCOUNTER — Other Ambulatory Visit (HOSPITAL_COMMUNITY): Payer: Self-pay

## 2024-01-11 MED ORDER — LEVOTHYROXINE SODIUM 75 MCG PO TABS
75.0000 ug | ORAL_TABLET | Freq: Every day | ORAL | 3 refills | Status: AC
  Filled 2024-01-11: qty 90, 90d supply, fill #0
  Filled 2024-04-12: qty 90, 90d supply, fill #1

## 2024-01-23 ENCOUNTER — Encounter: Payer: Self-pay | Admitting: Radiology

## 2024-01-30 ENCOUNTER — Encounter (HOSPITAL_BASED_OUTPATIENT_CLINIC_OR_DEPARTMENT_OTHER): Payer: Self-pay

## 2024-01-31 ENCOUNTER — Other Ambulatory Visit (HOSPITAL_BASED_OUTPATIENT_CLINIC_OR_DEPARTMENT_OTHER): Payer: Self-pay

## 2024-01-31 ENCOUNTER — Ambulatory Visit (HOSPITAL_BASED_OUTPATIENT_CLINIC_OR_DEPARTMENT_OTHER): Admitting: Student

## 2024-01-31 DIAGNOSIS — M5416 Radiculopathy, lumbar region: Secondary | ICD-10-CM | POA: Diagnosis not present

## 2024-01-31 MED ORDER — MELOXICAM 15 MG PO TABS
15.0000 mg | ORAL_TABLET | Freq: Every day | ORAL | 0 refills | Status: AC
  Filled 2024-01-31: qty 14, 14d supply, fill #0

## 2024-01-31 NOTE — Progress Notes (Signed)
 Chief Complaint: Left leg pain     History of Present Illness:    Brenda Frost is a 58 y.o. female who presents today for follow-up of pain in her left leg.  Patient was seen in clinic just over 1 month ago with pain and stiffness around the left knee that traveled into the lower leg toward the ankle.  Patient now reports that pain begins around her posterior and lateral hip, and is most notable around the lateral thigh, and travels medially through the knee to the ankle.  Pain is described as a throbbing sensation and worsens with long periods of sitting.  Denies any numbness or tingling.  Has not experienced any pain in the low back.   Surgical History:   None  PMH/PSH/Family History/Social History/Meds/Allergies:    Past Medical History:  Diagnosis Date   Hyperlipidemia    Hypothyroidism    Past Surgical History:  Procedure Laterality Date   CESAREAN SECTION     ORIF ELBOW FRACTURE Right 02/15/2016   Procedure: OPEN REDUCTION INTERNAL FIXATION (ORIF) ELBOW/OLECRANON FRACTURE;  Surgeon: Prentice Pagan, MD;  Location: [facility] OR;  Service: Orthopedics;  Laterality: Right;   Social History   Socioeconomic History   Marital status: Married    Spouse name: Not on file   Number of children: 2   Years of education: Not on file   Highest education level: Doctorate  Occupational History   Occupation: It Trainer: [employer]  Tobacco Use   Smoking status: Never   Smokeless tobacco: Never  Vaping Use   Vaping status: Never Used  Substance and Sexual Activity   Alcohol use: Not Currently    Alcohol/week: 1.0 standard drink of alcohol    Types: 1 Cans of beer per week   Drug use: No   Sexual activity: Not on file  Other Topics Concern   Not on file  Social History Narrative   Not on file   Social Drivers of Health   Financial Resource Strain: Low Risk  (06/23/2023)   Overall Financial Resource Strain (CARDIA)     Difficulty of Paying Living Expenses: Not hard at all  Food Insecurity: No Food Insecurity (06/23/2023)   Hunger Vital Sign    Worried About Running Out of Food in the Last Year: Never true    Ran Out of Food in the Last Year: Never true  Transportation Needs: No Transportation Needs (06/23/2023)   PRAPARE - Administrator, Civil Service (Medical): No    Lack of Transportation (Non-Medical): No  Physical Activity: Sufficiently Active (06/23/2023)   Exercise Vital Sign    Days of Exercise per Week: 5 days    Minutes of Exercise per Session: 60 min  Stress: No Stress Concern Present (06/23/2023)   [employer] of Occupational Health - Occupational Stress Questionnaire    Feeling of Stress : Only a little  Social Connections: Moderately Integrated (06/23/2023)   Social Connection and Isolation Panel    Frequency of Communication with Friends and Family: More than three times a week    Frequency of Social Gatherings with Friends and Family: Once a week    Attends Religious Services: 1 to 4 times per year    Active Member of [employer] or Organizations: No    Attends [employer]  or Organization Meetings: Not on file    Marital Status: Married   Family History  Problem Relation Age of Onset   Diabetes Mother    Hyperlipidemia Mother    Hypertension Mother    Kidney disease Mother    Heart disease Maternal Grandmother    CAD Neg Hx    Breast cancer Neg Hx    Colon cancer Neg Hx    Esophageal cancer Neg Hx    Inflammatory bowel disease Neg Hx    Liver disease Neg Hx    Pancreatic cancer Neg Hx    Rectal cancer Neg Hx    Stomach cancer Neg Hx    No Known Allergies Current Outpatient Medications  Medication Sig Dispense Refill   meloxicam  (MOBIC ) 15 MG tablet Take 1 tablet (15 mg total) by mouth daily for 14 days. 14 tablet 0   Evolocumab  (REPATHA  SURECLICK) 140 MG/ML SOAJ Inject 140 mg into the skin every 14 (fourteen) days. 6 mL 3   levothyroxine  (SYNTHROID ) 75 MCG tablet Take 1  tablet (75 mcg total) by mouth daily. 90 tablet 3   methylPREDNISolone  (MEDROL  DOSEPAK) 4 MG TBPK tablet Take per packet instructions 21 tablet 0   pantoprazole  (PROTONIX ) 20 MG tablet Take 1 tablet (20 mg total) by mouth daily. 90 tablet 3   rosuvastatin  (CRESTOR ) 20 MG tablet Take 1 tablet (20 mg total) by mouth daily. 90 tablet 3   No current facility-administered medications for this visit.   No results found.  Review of Systems:   A ROS was performed including pertinent positives and negatives as documented in the HPI.  Physical Exam :   Constitutional: NAD and appears stated age Neurological: Alert and oriented Psych: Appropriate affect and cooperative Last menstrual period 04/25/2016.   Comprehensive Musculoskeletal Exam:    No significant tenderness in the lumbar spine or lumbar paraspinal musculature.  There is some discomfort noted in passive hip flexion.  Posterior discomfort is noted with FABER.  Positive left straight leg raise.  Knee flexion strength 4/5 compared to full on contralateral side.  Knee extension and ankle dorsiflexion/plantarflexion strength 5/5.  Imaging:     Assessment:   58 y.o. female with pain traveling from the left hip to the ankle.  This does appear to follow an L4 radiculopathy pattern although patient is not currently experiencing any pain or tenderness in the low back.  There is some slight weakness noted in left knee flexion, although remainder of strength exam is intact.  Discussed that conservative treatment is indicated before proceeding with any imaging, however an x-ray with possible MRI evaluation may be necessary if symptoms persist or worsen.  Will plan to refer her for physical therapy and in the meantime she can continue at home stretches and exercises.  She is taking occasional ibuprofen with short-term relief so we will switch this to meloxicam  15 mg daily to see if this brings more benefit.  Patient can plan to follow-up as needed,  particularly if symptoms fail to improve with PT.  Plan :    - Referral to physical therapy and follow-up within 6 weeks if symptoms fail to improve     I personally saw and evaluated the patient, and participated in the management and treatment plan.  Leonce Reveal, PA-C Orthopedics

## 2024-02-09 DIAGNOSIS — M5416 Radiculopathy, lumbar region: Secondary | ICD-10-CM | POA: Diagnosis not present

## 2024-02-13 DIAGNOSIS — M5416 Radiculopathy, lumbar region: Secondary | ICD-10-CM | POA: Diagnosis not present

## 2024-02-20 DIAGNOSIS — M5416 Radiculopathy, lumbar region: Secondary | ICD-10-CM | POA: Diagnosis not present

## 2024-02-24 DIAGNOSIS — M5416 Radiculopathy, lumbar region: Secondary | ICD-10-CM | POA: Diagnosis not present

## 2024-03-05 ENCOUNTER — Encounter (HOSPITAL_BASED_OUTPATIENT_CLINIC_OR_DEPARTMENT_OTHER): Payer: Self-pay

## 2024-03-05 DIAGNOSIS — M5416 Radiculopathy, lumbar region: Secondary | ICD-10-CM | POA: Diagnosis not present

## 2024-03-08 DIAGNOSIS — M5416 Radiculopathy, lumbar region: Secondary | ICD-10-CM | POA: Diagnosis not present

## 2024-03-09 ENCOUNTER — Ambulatory Visit (HOSPITAL_BASED_OUTPATIENT_CLINIC_OR_DEPARTMENT_OTHER)

## 2024-03-09 ENCOUNTER — Other Ambulatory Visit (HOSPITAL_BASED_OUTPATIENT_CLINIC_OR_DEPARTMENT_OTHER): Payer: Self-pay

## 2024-03-09 ENCOUNTER — Ambulatory Visit (HOSPITAL_BASED_OUTPATIENT_CLINIC_OR_DEPARTMENT_OTHER): Admitting: Student

## 2024-03-09 DIAGNOSIS — M5442 Lumbago with sciatica, left side: Secondary | ICD-10-CM | POA: Diagnosis not present

## 2024-03-09 DIAGNOSIS — M5416 Radiculopathy, lumbar region: Secondary | ICD-10-CM

## 2024-03-09 MED ORDER — MELOXICAM 15 MG PO TABS
15.0000 mg | ORAL_TABLET | Freq: Every day | ORAL | 0 refills | Status: AC
  Filled 2024-03-09: qty 14, 14d supply, fill #0

## 2024-03-09 MED ORDER — METHOCARBAMOL 500 MG PO TABS
500.0000 mg | ORAL_TABLET | Freq: Four times a day (QID) | ORAL | 0 refills | Status: AC
  Filled 2024-03-09: qty 40, 10d supply, fill #0

## 2024-03-09 NOTE — Progress Notes (Signed)
 "                                Chief Complaint: Left leg pain     History of Present Illness:   03/09/24: Patient presents today for follow-up evaluation of left leg pain.  She has been working with physical therapy and states that this does help with her symptoms very temporarily although become quickly aggravated particular with sitting.  She does have some discomfort in the left side of her low back although states that this is not tender.  Pain travels into the lateral thigh and down at the front of the lower leg to the ankle.  Experiences occasional numbness and tingling but this is not constant.  Symptoms have been disrupting her ability to sleep at night.  She is taking Tylenol  and ibuprofen.  States that onset of symptoms began about 2 months ago shortly after performing dead lifts.   2024-02-03: Dwan M Chamorro is a 58 y.o. female who presents today for follow-up of pain in her left leg.  Patient was seen in clinic just over 1 month ago with pain and stiffness around the left knee that traveled into the lower leg toward the ankle.  Patient now reports that pain begins around her posterior and lateral hip, and is most notable around the lateral thigh, and travels medially through the knee to the ankle.  Pain is described as a throbbing sensation and worsens with long periods of sitting.  Denies any numbness or tingling.  Has not experienced any pain in the low back.  Surgical History:   None  PMH/PSH/Family History/Social History/Meds/Allergies:    Past Medical History:  Diagnosis Date   Hyperlipidemia    Hypothyroidism    Past Surgical History:  Procedure Laterality Date   CESAREAN SECTION     ORIF ELBOW FRACTURE Right 02/15/2016   Procedure: OPEN REDUCTION INTERNAL FIXATION (ORIF) ELBOW/OLECRANON FRACTURE;  Surgeon: Prentice Pagan, MD;  Location: [facility] OR;  Service: Orthopedics;  Laterality: Right;   Social History   Socioeconomic History   Marital status: Married    Spouse  name: Not on file   Number of children: 2   Years of education: Not on file   Highest education level: Doctorate  Occupational History   Occupation: It Trainer: [employer]  Tobacco Use   Smoking status: Never   Smokeless tobacco: Never  Vaping Use   Vaping status: Never Used  Substance and Sexual Activity   Alcohol use: Not Currently    Alcohol/week: 1.0 standard drink of alcohol    Types: 1 Cans of beer per week   Drug use: No   Sexual activity: Not on file  Other Topics Concern   Not on file  Social History Narrative   Not on file   Social Drivers of Health   Tobacco Use: Low Risk (10/26/2023)   Patient History    Smoking Tobacco Use: Never    Smokeless Tobacco Use: Never    Passive Exposure: Not on file  Financial Resource Strain: Low Risk (06/23/2023)   Overall Financial Resource Strain (CARDIA)    Difficulty of Paying Living Expenses: Not hard at all  Food Insecurity: No Food Insecurity (06/23/2023)   Hunger Vital Sign    Worried About Running Out of Food in the Last Year: Never true    Ran Out of Food in the Last Year: Never true  Transportation  Needs: No Transportation Needs (06/23/2023)   PRAPARE - Administrator, Civil Service (Medical): No    Lack of Transportation (Non-Medical): No  Physical Activity: Sufficiently Active (06/23/2023)   Exercise Vital Sign    Days of Exercise per Week: 5 days    Minutes of Exercise per Session: 60 min  Stress: No Stress Concern Present (06/23/2023)   [employer] of Occupational Health - Occupational Stress Questionnaire    Feeling of Stress : Only a little  Social Connections: Moderately Integrated (06/23/2023)   Social Connection and Isolation Panel    Frequency of Communication with Friends and Family: More than three times a week    Frequency of Social Gatherings with Friends and Family: Once a week    Attends Religious Services: 1 to 4 times per year    Active Member of [employer] or  Organizations: No    Attends Engineer, Structural: Not on file    Marital Status: Married  Depression (PHQ2-9): Low Risk (10/26/2023)   Depression (PHQ2-9)    PHQ-2 Score: 0  Alcohol Screen: Not on file  Housing: Low Risk (06/23/2023)   Housing Stability Vital Sign    Unable to Pay for Housing in the Last Year: No    Number of Times Moved in the Last Year: 0    Homeless in the Last Year: No  Utilities: Not on file  Health Literacy: Not on file   Family History  Problem Relation Age of Onset   Diabetes Mother    Hyperlipidemia Mother    Hypertension Mother    Kidney disease Mother    Heart disease Maternal Grandmother    CAD Neg Hx    Breast cancer Neg Hx    Colon cancer Neg Hx    Esophageal cancer Neg Hx    Inflammatory bowel disease Neg Hx    Liver disease Neg Hx    Pancreatic cancer Neg Hx    Rectal cancer Neg Hx    Stomach cancer Neg Hx    No Known Allergies Current Outpatient Medications  Medication Sig Dispense Refill   meloxicam  (MOBIC ) 15 MG tablet Take 1 tablet (15 mg total) by mouth daily for 14 days. 14 tablet 0   methocarbamol  (ROBAXIN ) 500 MG tablet Take 1 tablet (500 mg total) by mouth 4 (four) times daily for 10 days. 40 tablet 0   Evolocumab  (REPATHA  SURECLICK) 140 MG/ML SOAJ Inject 140 mg into the skin every 14 (fourteen) days. 6 mL 3   levothyroxine  (SYNTHROID ) 75 MCG tablet Take 1 tablet (75 mcg total) by mouth daily. 90 tablet 3   methylPREDNISolone  (MEDROL  DOSEPAK) 4 MG TBPK tablet Take per packet instructions 21 tablet 0   pantoprazole  (PROTONIX ) 20 MG tablet Take 1 tablet (20 mg total) by mouth daily. 90 tablet 3   rosuvastatin  (CRESTOR ) 20 MG tablet Take 1 tablet (20 mg total) by mouth daily. 90 tablet 3   No current facility-administered medications for this visit.   No results found.  Review of Systems:   A ROS was performed including pertinent positives and negatives as documented in the HPI.  Physical Exam :   Constitutional: NAD and  appears stated age Neurological: Alert and oriented Psych: Appropriate affect and cooperative Last menstrual period 04/25/2016.   Comprehensive Musculoskeletal Exam:    Lumbar midline and paraspinal musculature is grossly nontender.  Full fluid passive left hip range of motion 120 degrees flexion, 40 degrees external rotation, 30 degrees internal rotation.  Negative left  straight leg raise.  Posterior discomfort with FABER.  Patellar DTRs 2+ and equal.  Knee flexion strength 4/5 compared to contralateral side, otherwise 5/5 strength with knee extension and ankle dorsiflexion/plantarflexion.  Imaging:   Xray (lumbar spine 4 views): Mild L5-S1 disc space narrowing and lower facet hypertrophy but otherwise well-maintained alignment and disc spacing without evidence of listhesis.   I personally reviewed and interpreted the radiographs.:  Assessment:   58 y.o. female with persistent left-sided low back and leg pain consistent with sciatica.  This does follow an L4-L5 pattern.  She has unfortunately not gotten any persistent benefit at this time from physical therapy.  Given her exam today as well as initial mechanism involving a dead lift, I do have suspicion for a left-sided disc herniation and therefore would like to proceed at this time with an MRI of the lumbar spine for further assessment.  Will plan to have her continue physical therapy in the meantime as tolerated.  Sent in short course of meloxicam  which she has tolerated well in the past and discussed to hold ibuprofen.  Will have her follow-up shortly after MRI for review and treatment discussion.  Plan :    - Obtain lumbar MRI and return to clinic for review     I personally saw and evaluated the patient, and participated in the management and treatment plan.  Leonce Reveal, PA-C Orthopedics "

## 2024-03-13 ENCOUNTER — Encounter (HOSPITAL_BASED_OUTPATIENT_CLINIC_OR_DEPARTMENT_OTHER): Payer: Self-pay | Admitting: Student

## 2024-03-17 ENCOUNTER — Ambulatory Visit
Admission: RE | Admit: 2024-03-17 | Discharge: 2024-03-17 | Disposition: A | Source: Ambulatory Visit | Attending: Student

## 2024-03-17 DIAGNOSIS — M5442 Lumbago with sciatica, left side: Secondary | ICD-10-CM

## 2024-03-26 ENCOUNTER — Encounter (HOSPITAL_BASED_OUTPATIENT_CLINIC_OR_DEPARTMENT_OTHER): Payer: Self-pay

## 2024-04-13 ENCOUNTER — Other Ambulatory Visit: Payer: Self-pay

## 2024-04-17 ENCOUNTER — Other Ambulatory Visit: Payer: Self-pay

## 2024-04-19 ENCOUNTER — Other Ambulatory Visit: Payer: Self-pay

## 2024-10-26 ENCOUNTER — Encounter: Admitting: Physician Assistant
